# Patient Record
Sex: Female | Born: 1975 | Race: Black or African American | Hispanic: No | Marital: Single | Smoking: Never smoker
Health system: Southern US, Community
[De-identification: ages and names within clinical notes are randomized; demographics above are authoritative.]

## PROBLEM LIST (undated history)

## (undated) DIAGNOSIS — J45909 Unspecified asthma, uncomplicated: Secondary | ICD-10-CM

## (undated) DIAGNOSIS — E079 Disorder of thyroid, unspecified: Secondary | ICD-10-CM

## (undated) DIAGNOSIS — E669 Obesity, unspecified: Secondary | ICD-10-CM

## (undated) DIAGNOSIS — F329 Major depressive disorder, single episode, unspecified: Secondary | ICD-10-CM

## (undated) DIAGNOSIS — I10 Essential (primary) hypertension: Secondary | ICD-10-CM

## (undated) DIAGNOSIS — F32A Depression, unspecified: Secondary | ICD-10-CM

## (undated) DIAGNOSIS — E039 Hypothyroidism, unspecified: Secondary | ICD-10-CM

## (undated) DIAGNOSIS — F419 Anxiety disorder, unspecified: Secondary | ICD-10-CM

## (undated) DIAGNOSIS — J302 Other seasonal allergic rhinitis: Secondary | ICD-10-CM

## (undated) HISTORY — DX: Obesity, unspecified: E66.9

## (undated) HISTORY — DX: Unspecified asthma, uncomplicated: J45.909

## (undated) HISTORY — PX: APPENDECTOMY: SHX54

## (undated) HISTORY — DX: Depression, unspecified: F32.A

## (undated) HISTORY — PX: DILATION AND CURETTAGE OF UTERUS: SHX78

## (undated) HISTORY — DX: Major depressive disorder, single episode, unspecified: F32.9

## (undated) HISTORY — DX: Anxiety disorder, unspecified: F41.9

## (undated) HISTORY — PX: ABDOMINAL HYSTERECTOMY: SHX81

## (undated) HISTORY — PX: THYROIDECTOMY: SHX17

## (undated) HISTORY — PX: WISDOM TOOTH EXTRACTION: SHX21

## (undated) HISTORY — PX: SHOULDER SURGERY: SHX246

---

## 2007-11-10 ENCOUNTER — Emergency Department (HOSPITAL_COMMUNITY): Admission: EM | Admit: 2007-11-10 | Discharge: 2007-11-10 | Payer: Self-pay | Admitting: Emergency Medicine

## 2008-05-14 ENCOUNTER — Emergency Department (HOSPITAL_COMMUNITY): Admission: EM | Admit: 2008-05-14 | Discharge: 2008-05-14 | Payer: Self-pay | Admitting: Emergency Medicine

## 2008-07-02 ENCOUNTER — Emergency Department (HOSPITAL_COMMUNITY): Admission: EM | Admit: 2008-07-02 | Discharge: 2008-07-02 | Payer: Self-pay | Admitting: *Deleted

## 2008-08-02 ENCOUNTER — Emergency Department (HOSPITAL_COMMUNITY): Admission: EM | Admit: 2008-08-02 | Discharge: 2008-08-02 | Payer: Self-pay | Admitting: Emergency Medicine

## 2008-08-08 ENCOUNTER — Emergency Department (HOSPITAL_COMMUNITY): Admission: EM | Admit: 2008-08-08 | Discharge: 2008-08-09 | Payer: Self-pay | Admitting: Emergency Medicine

## 2008-08-11 ENCOUNTER — Encounter: Payer: Self-pay | Admitting: Emergency Medicine

## 2008-08-12 ENCOUNTER — Inpatient Hospital Stay (HOSPITAL_COMMUNITY): Admission: AD | Admit: 2008-08-12 | Discharge: 2008-08-12 | Payer: Self-pay | Admitting: Obstetrics and Gynecology

## 2008-08-24 ENCOUNTER — Ambulatory Visit: Payer: Self-pay | Admitting: Obstetrics & Gynecology

## 2008-08-24 ENCOUNTER — Ambulatory Visit (HOSPITAL_COMMUNITY): Admission: AD | Admit: 2008-08-24 | Discharge: 2008-08-24 | Payer: Self-pay | Admitting: Obstetrics and Gynecology

## 2008-08-24 ENCOUNTER — Encounter: Payer: Self-pay | Admitting: Obstetrics & Gynecology

## 2009-05-23 ENCOUNTER — Emergency Department (HOSPITAL_COMMUNITY): Admission: EM | Admit: 2009-05-23 | Discharge: 2009-05-23 | Payer: Self-pay | Admitting: Emergency Medicine

## 2009-10-29 IMAGING — US US OB TRANSVAGINAL
1 series · 14 of 28 positions shown · non-contrast
Comparison: none

CLINICAL DATA: Early pregnancy, vaginal bleeding, pain

OBSTETRIC <14 WK US AND TRANSVAGINAL OB US
TECHNIQUE: Both transabdominal and transvaginal ultrasound
examinations were performed for complete evaluation of the
gestation as well as the maternal uterus, adnexal regions, and
pelvic cul-de-sac.

[Series 1: unknown · 0.32mm/px · 14 of 39 slices shown]
[im 2/39]
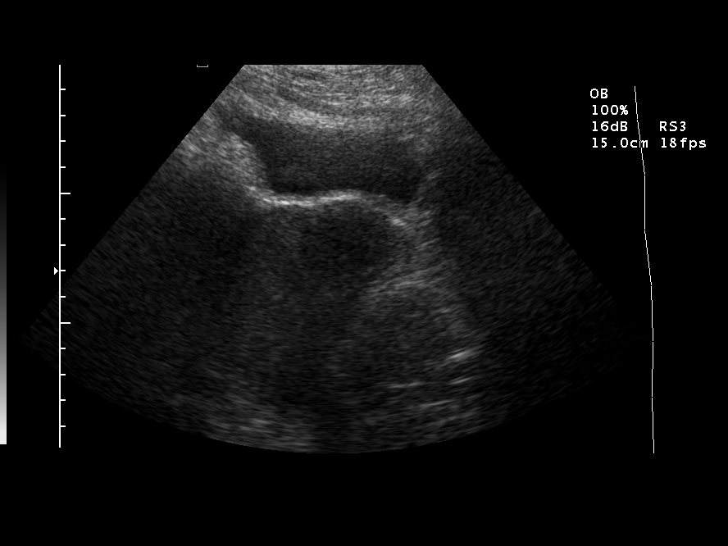
[im 5/39]
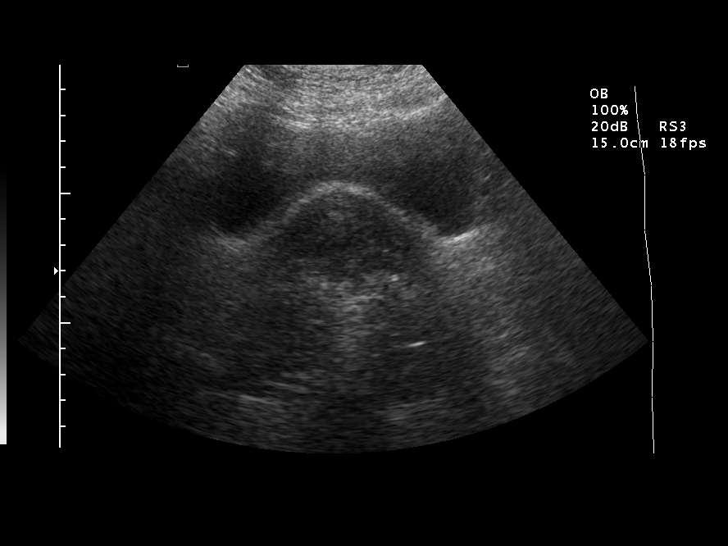
[im 8/39]
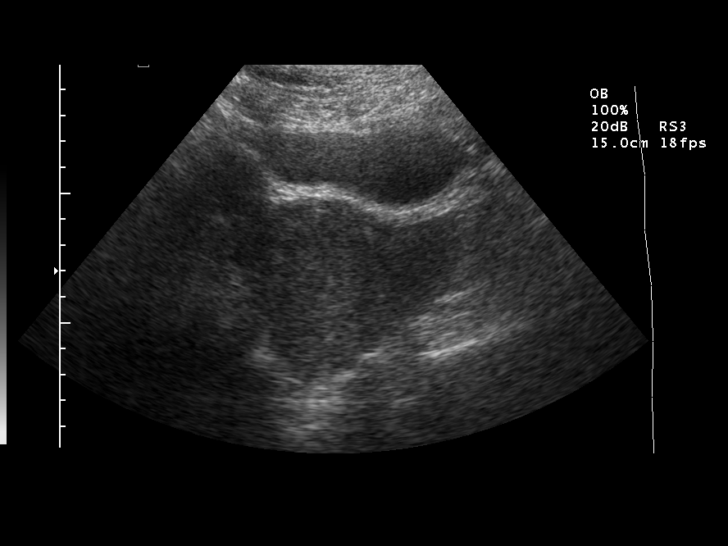
[im 10/39]
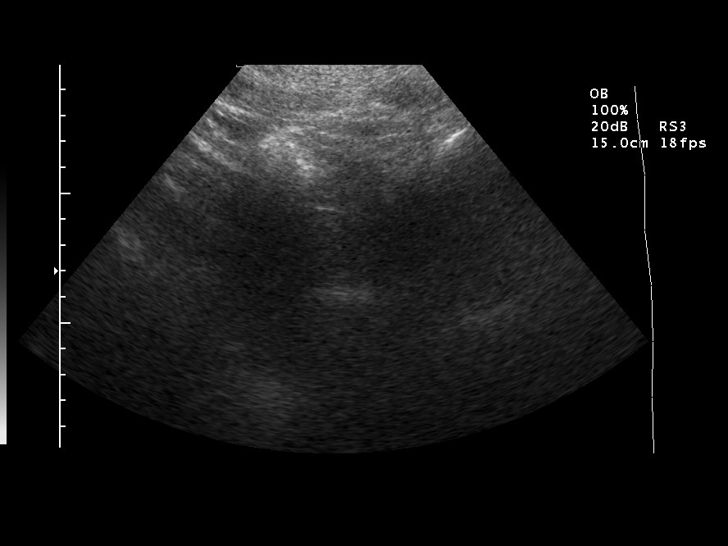
[im 13/39]
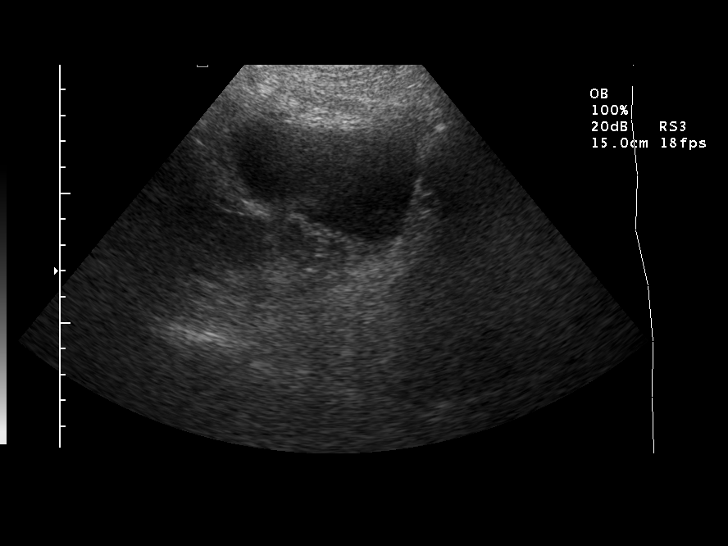
[im 16/39]
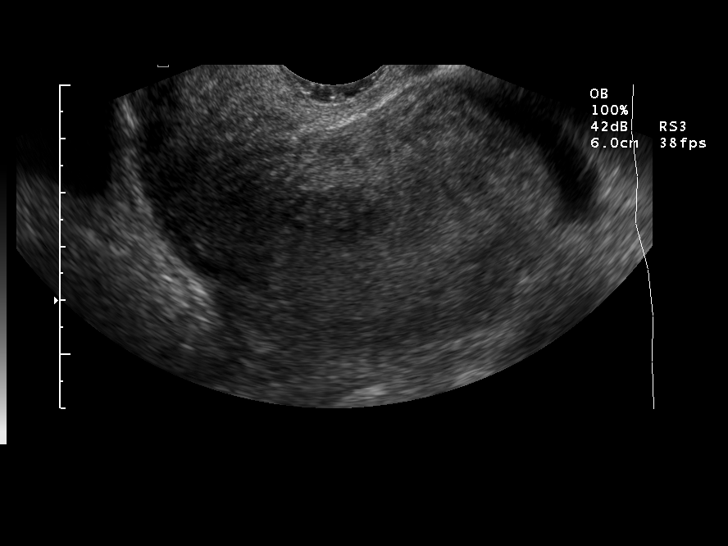
[im 19/39]
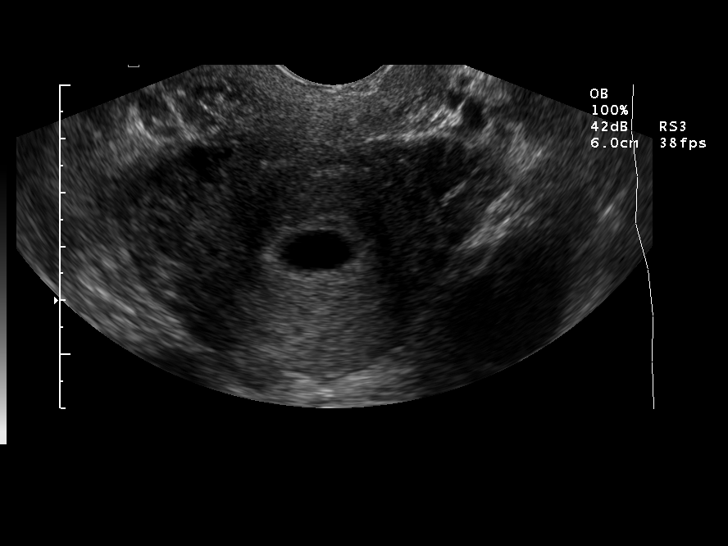
[im 22/39]
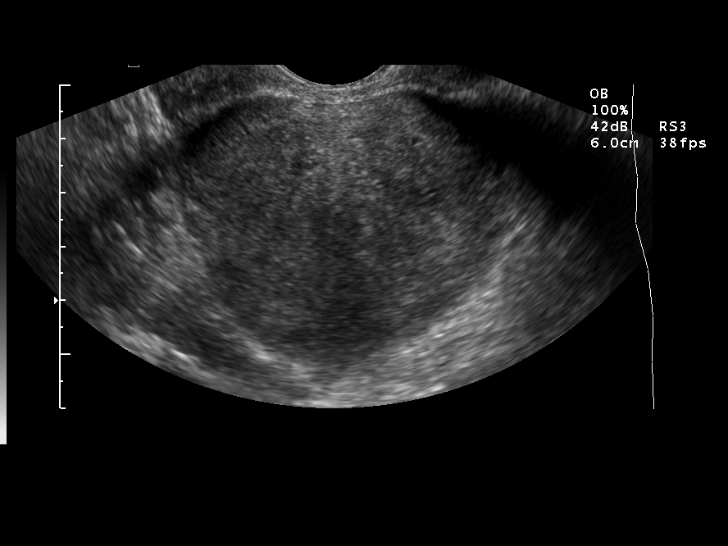
[im 24/39]
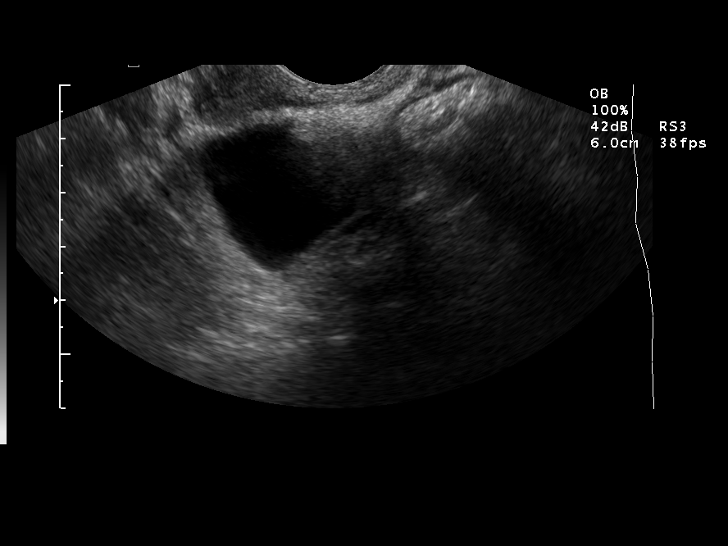
[im 27/39]
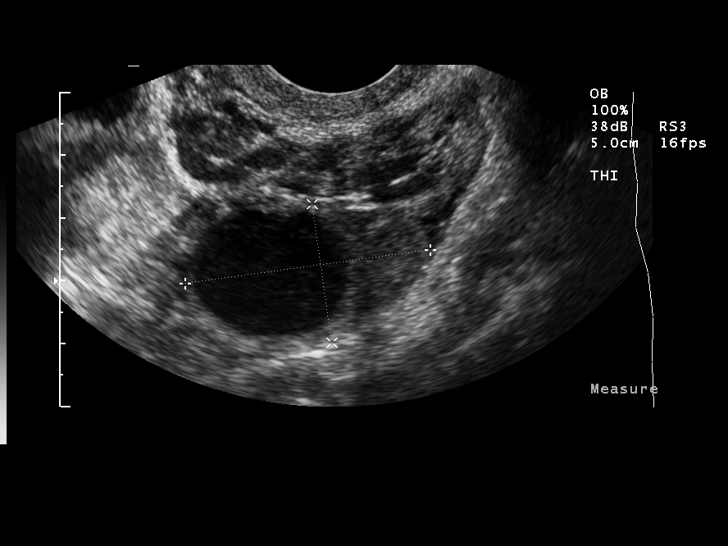
[im 30/39]
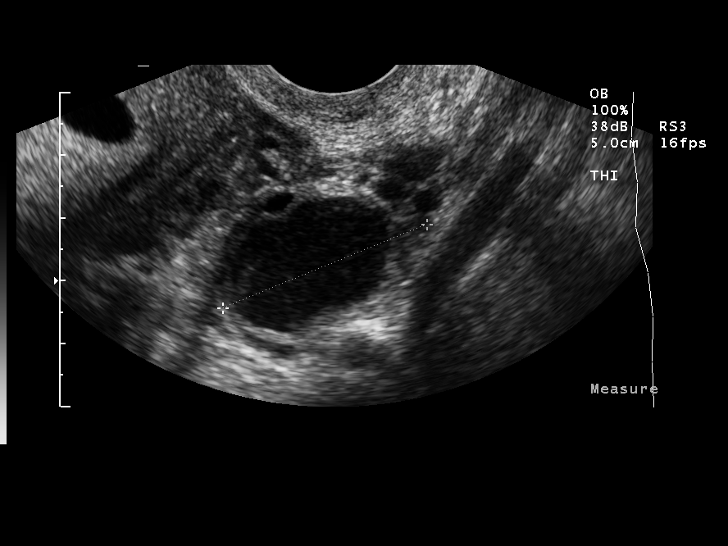
[im 33/39]
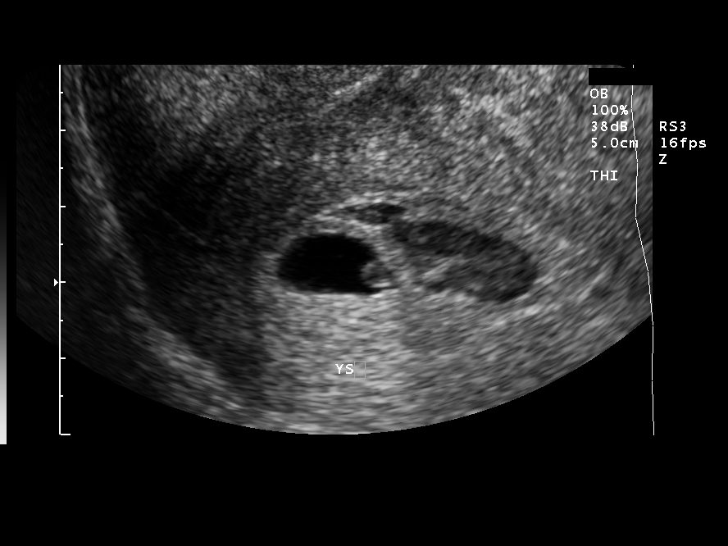
[im 36/39]
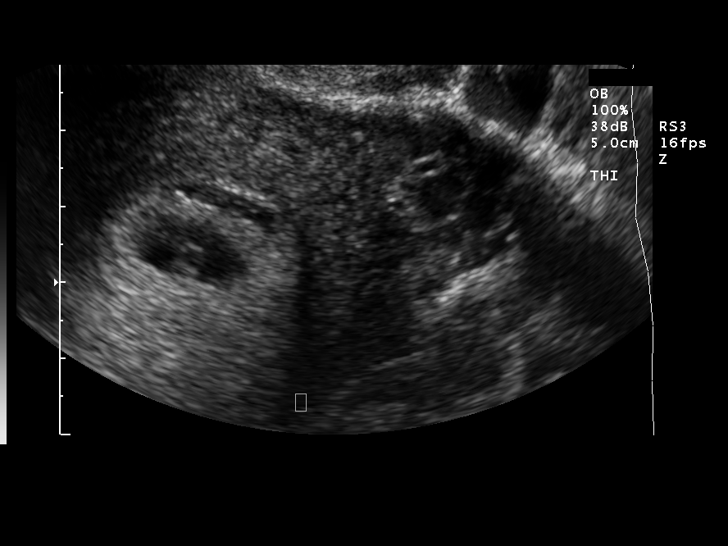
[im 39/39]
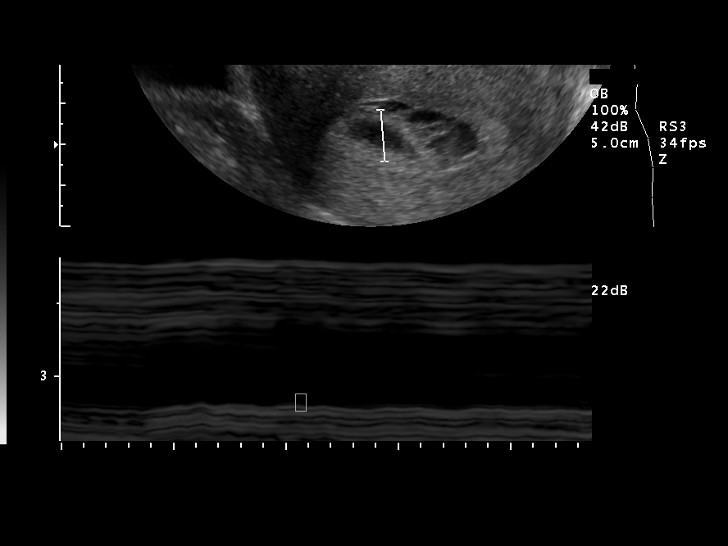

[14 of 28 positions shown; findings below may reference images not displayed]

Intrauterine gestational sac: Present
Yolk sac: Present
Embryo: Not identified
Cardiac Activity: Not identified
Heart Rate: N/A bpm

MSD: 13 mm    6 w 1 d
CRL:               w     d          US EDC:

Maternal uterus/adnexae:
Large subchorionic hemorrhage, 2.9 x 1.0 x 2.3 cm.
No fetal pole visualized.
No free pelvic fluid.
Right ovary normal size and morphology, 2.3 x 1.5 x 1.5 cm.
Left ovary measures 3.9 x 2.2 x 3.5 cm contains a complicated cyst
2.5 x 2.2 x 2.7 cm.
No adnexal masses.
IMPRESSION: Early intrauterine gestation with visualization of a gestational
sac and yolk sac, though no fetal pole is identified.
Large subchorionic hemorrhage.
Follow-up pelvic ultrasound recommended in 7-10 days to assess for
viable intrauterine gestation.
Complicated left ovarian cyst 2.7 cm diameter.

## 2010-08-09 ENCOUNTER — Emergency Department (HOSPITAL_COMMUNITY): Admission: EM | Admit: 2010-08-09 | Discharge: 2010-08-10 | Payer: Self-pay | Admitting: Emergency Medicine

## 2011-01-21 LAB — BASIC METABOLIC PANEL
CO2: 25 mEq/L (ref 19–32)
Chloride: 100 mEq/L (ref 96–112)
Creatinine, Ser: 0.9 mg/dL (ref 0.4–1.2)
GFR calc Af Amer: >60 mL/min (ref >60)
Potassium: 3.1 mEq/L — ABNORMAL LOW (ref 3.5–5.1)

## 2011-01-21 LAB — CBC
HCT: 31.9 % — ABNORMAL LOW (ref 36.0–46.0)
MCH: 30.7 pg (ref 26.0–34.0)
MCV: 88.4 fL (ref 78.0–100.0)
Platelets: 194 10*3/uL (ref 150–400)
RBC: 3.61 MIL/uL — ABNORMAL LOW (ref 3.87–5.11)
WBC: 8.9 10*3/uL (ref 4.0–10.5)

## 2011-01-21 LAB — DIFFERENTIAL
Eosinophils Absolute: 0 10*3/uL (ref 0.0–0.7)
Eosinophils Relative: 0 % (ref 0–5)
Lymphocytes Relative: 31 % (ref 12–46)
Lymphs Abs: 2.8 10*3/uL (ref 0.7–4.0)
Monocytes Absolute: 0.7 10*3/uL (ref 0.1–1.0)
Monocytes Relative: 8 % (ref 3–12)

## 2011-01-21 LAB — GC/CHLAMYDIA PROBE AMP, GENITAL
Chlamydia, DNA Probe: NEGATIVE
GC Probe Amp, Genital: NEGATIVE

## 2011-01-21 LAB — WET PREP, GENITAL: Trich, Wet Prep: NONE SEEN

## 2011-01-21 LAB — HCG, QUANTITATIVE, PREGNANCY: hCG, Beta Chain, Quant, S: 96128 m[IU]/mL — ABNORMAL HIGH (ref <5)

## 2011-02-14 LAB — WET PREP, GENITAL
Trich, Wet Prep: NONE SEEN
Yeast Wet Prep HPF POC: NONE SEEN

## 2011-02-14 LAB — CBC
Hemoglobin: 13.1 g/dL (ref 12.0–15.0)
MCHC: 34.5 g/dL (ref 30.0–36.0)
Platelets: 192 10*3/uL (ref 150–400)
RDW: 15.4 % (ref 11.5–15.5)

## 2011-02-14 LAB — URINALYSIS, ROUTINE W REFLEX MICROSCOPIC
Bilirubin Urine: NEGATIVE
Ketones, ur: NEGATIVE mg/dL
Nitrite: NEGATIVE
Protein, ur: NEGATIVE mg/dL
pH: 7.5 (ref 5.0–8.0)

## 2011-02-14 LAB — COMPREHENSIVE METABOLIC PANEL
ALT: 19 U/L (ref 0–35)
Albumin: 4 g/dL (ref 3.5–5.2)
BUN: 9 mg/dL (ref 6–23)
Calcium: 9.1 mg/dL (ref 8.4–10.5)
Glucose, Bld: 102 mg/dL — ABNORMAL HIGH (ref 70–99)
Potassium: 3.4 mEq/L — ABNORMAL LOW (ref 3.5–5.1)
Sodium: 139 mEq/L (ref 135–145)
Total Protein: 7.4 g/dL (ref 6.0–8.3)

## 2011-02-14 LAB — POCT PREGNANCY, URINE: Preg Test, Ur: NEGATIVE

## 2011-02-14 LAB — GC/CHLAMYDIA PROBE AMP, GENITAL
Chlamydia, DNA Probe: NEGATIVE
GC Probe Amp, Genital: NEGATIVE

## 2011-02-14 LAB — DIFFERENTIAL
Lymphs Abs: 3 10*3/uL (ref 0.7–4.0)
Monocytes Absolute: 0.4 10*3/uL (ref 0.1–1.0)
Monocytes Relative: 6 % (ref 3–12)
Neutro Abs: 3.4 10*3/uL (ref 1.7–7.7)
Neutrophils Relative %: 48 % (ref 43–77)

## 2011-03-23 NOTE — Op Note (Signed)
NAME:  Janice Roberts, Janice Roberts NO.:  [id number]   MEDICAL RECORD NO.:  [id number]          PATIENT TYPE:  [facility]   LOCATION:  [facility]                          FACILITY:  [facility]   PHYSICIAN:  Lesly Dukes, M.D. DATE OF BIRTH:  03/15/1976   DATE OF PROCEDURE:  DATE OF DISCHARGE:                               OPERATIVE REPORT   PREOPERATIVE DIAGNOSES:  86. A 35 year old G2, para 1-0-1-1 with retained products of conception      after spontaneous abortion.  2. Anemia.   POSTOPERATIVE DIAGNOSES:  85. A 35 year old G2, para 1-0-1-1 with retained products of conception      after spontaneous abortion.  2. Anemia.   PROCEDURE:  Dilation and evacuation.   SURGEON:  Lesly Dukes, MD.   ANESTHESIA:  MAC and local.   FINDINGS:  Retroverted uterus, slightly enlarged with active bleeding.   SPECIMENS:  Endometrial curettings to Pathology.   ESTIMATED BLOOD LOSS:  50.   COMPLICATIONS:  None.   PROCEDURE:  After informed consent was obtained, the patient was taken  to the operating room where MAC anesthesia was found to be adequate.  The patient was placed in dorsal lithotomy position and  prepped and  draped in the normal sterile fashion.  The bladder was emptied.  A  bivalve speculum was placed into the patient's vagina and the anterior  lip of the cervix was grasped with a single-tooth tenaculum.  The cervix  was gently dilated with sequential dilators.  A #8 suction curette was  gently introduced into the uterus and suction curettage was performed.  A sharp curette was then introduced into the uterus and a good curl was  fallen off four sides of the uterus.  A final pass with a suction  curette was performed to ensure that all products of conception were  removed from the uterus.  At the end of the procedure, there was good  hemostasis.  All counts were correct x2.  The patient tolerated the  procedure well.  The patient went to recovery room in stable condition.      Lesly Dukes, M.D.  Electronically Signed    KHL/MEDQ  D:  08/24/2008  T:  08/24/2008  Job:  [id number]

## 2011-07-29 LAB — DIFFERENTIAL
Basophils Absolute: 0.1
Basophils Relative: 2 — ABNORMAL HIGH
Eosinophils Absolute: 0
Monocytes Absolute: 0.4
Neutro Abs: 3.3
Neutrophils Relative %: 51

## 2011-07-29 LAB — POCT PREGNANCY, URINE: Operator id: 285841

## 2011-07-29 LAB — URINALYSIS, ROUTINE W REFLEX MICROSCOPIC
Bilirubin Urine: NEGATIVE
Glucose, UA: NEGATIVE
Ketones, ur: NEGATIVE
Protein, ur: NEGATIVE

## 2011-07-29 LAB — BASIC METABOLIC PANEL
CO2: 25
Calcium: 9.2
Creatinine, Ser: 0.77
Glucose, Bld: 106 — ABNORMAL HIGH

## 2011-07-29 LAB — CBC
MCHC: 33.8
RDW: 14

## 2011-08-09 LAB — CBC
HCT: 35.5 — ABNORMAL LOW
HCT: 36.3
Hemoglobin: 12
Hemoglobin: 12.1
MCHC: 33.3
RBC: 3.87
RBC: 3.89
RDW: 15.3
RDW: 15.8 — ABNORMAL HIGH
WBC: 6.5

## 2011-08-09 LAB — URINALYSIS, ROUTINE W REFLEX MICROSCOPIC
Bilirubin Urine: NEGATIVE
Glucose, UA: NEGATIVE
Glucose, UA: NEGATIVE
Hgb urine dipstick: NEGATIVE
Hgb urine dipstick: NEGATIVE
Protein, ur: NEGATIVE
Specific Gravity, Urine: 1.028
pH: 7

## 2011-08-09 LAB — DIFFERENTIAL
Basophils Absolute: 0
Basophils Relative: 1
Eosinophils Absolute: 0.1
Eosinophils Relative: 1
Lymphocytes Relative: 38
Monocytes Absolute: 0.5
Monocytes Relative: 6
Monocytes Relative: 6
Neutro Abs: 2.9
Neutro Abs: 4.8
Neutrophils Relative %: 44

## 2011-08-09 LAB — COMPREHENSIVE METABOLIC PANEL
ALT: 17
Alkaline Phosphatase: 59
BUN: 2 — ABNORMAL LOW
CO2: 27
Chloride: 101
GFR calc non Af Amer: >60
Glucose, Bld: 97
Potassium: 2.8 — ABNORMAL LOW
Sodium: 137
Total Bilirubin: 1.3 — ABNORMAL HIGH

## 2011-08-09 LAB — POCT I-STAT, CHEM 8
BUN: 7
Chloride: 103
Creatinine, Ser: 1.1
Glucose, Bld: 103 — ABNORMAL HIGH
Potassium: 3.5
Sodium: 136

## 2011-08-09 LAB — ABO/RH: ABO/RH(D): B POS

## 2011-08-09 LAB — GC/CHLAMYDIA PROBE AMP, GENITAL
Chlamydia, DNA Probe: NEGATIVE
GC Probe Amp, Genital: NEGATIVE

## 2011-08-09 LAB — WET PREP, GENITAL
Clue Cells Wet Prep HPF POC: NONE SEEN
WBC, Wet Prep HPF POC: NONE SEEN

## 2011-08-09 LAB — HCG, QUANTITATIVE, PREGNANCY
hCG, Beta Chain, Quant, S: 23101 — ABNORMAL HIGH
hCG, Beta Chain, Quant, S: 109355 — ABNORMAL HIGH

## 2011-08-09 LAB — HEMOGLOBIN AND HEMATOCRIT, BLOOD: HCT: 34.3 — ABNORMAL LOW

## 2011-08-10 LAB — CBC
Hemoglobin: 9.4 — ABNORMAL LOW
MCHC: 33.8
RBC: 3 — ABNORMAL LOW
WBC: 5.3

## 2012-08-13 ENCOUNTER — Encounter (HOSPITAL_COMMUNITY): Payer: Self-pay | Admitting: Emergency Medicine

## 2012-08-13 ENCOUNTER — Other Ambulatory Visit: Payer: Self-pay

## 2012-08-13 ENCOUNTER — Emergency Department (HOSPITAL_COMMUNITY): Payer: Self-pay

## 2012-08-13 ENCOUNTER — Emergency Department (HOSPITAL_COMMUNITY)
Admission: EM | Admit: 2012-08-13 | Discharge: 2012-08-13 | Disposition: A | Discharge status: Home / Self Care | Payer: Self-pay | Attending: Emergency Medicine | Admitting: Emergency Medicine

## 2012-08-13 DIAGNOSIS — R0789 Other chest pain: Secondary | ICD-10-CM | POA: Insufficient documentation

## 2012-08-13 DIAGNOSIS — I10 Essential (primary) hypertension: Secondary | ICD-10-CM | POA: Insufficient documentation

## 2012-08-13 DIAGNOSIS — R0602 Shortness of breath: Secondary | ICD-10-CM | POA: Insufficient documentation

## 2012-08-13 HISTORY — DX: Essential (primary) hypertension: I10

## 2012-08-13 HISTORY — DX: Disorder of thyroid, unspecified: E07.9

## 2012-08-13 LAB — CBC
HCT: 37.1 % (ref 36.0–46.0)
MCHC: 34.2 g/dL (ref 30.0–36.0)
MCV: 86.3 fL (ref 78.0–100.0)
RDW: 14.7 % (ref 11.5–15.5)

## 2012-08-13 LAB — BASIC METABOLIC PANEL
BUN: 8 mg/dL (ref 6–23)
Chloride: 104 mEq/L (ref 96–112)
Creatinine, Ser: 0.96 mg/dL (ref 0.50–1.10)
GFR calc Af Amer: 87 mL/min — ABNORMAL LOW (ref >90)
GFR calc non Af Amer: 75 mL/min — ABNORMAL LOW (ref >90)

## 2012-08-13 NOTE — ED Provider Notes (Signed)
History     CSN: [id number]  Arrival date & time 08/13/12  1416   First MD Initiated Contact with Patient 08/13/12 1840      Chief Complaint  Patient presents with  . Chest Pain  . Shortness of Breath    (Consider location/radiation/quality/duration/timing/severity/associated sxs/prior treatment) HPI Comments: Patient presents here with tightness in the chest for the past two weeks.  There is no shortness of breath, diaphoresis, nausea, or radiation to the arm or jaw.  She denies an exertional component.  There are no fevers or chills.  There is no cough.    Patient is a 36 y.o. female presenting with chest pain. The history is provided by the patient.  Chest Pain The chest pain began 1 - 2 weeks ago. Chest pain occurs intermittently. The chest pain is unchanged. The pain is associated with breathing (position). The severity of the pain is moderate. The quality of the pain is described as tightness. The pain does not radiate. Chest pain is worsened by certain positions and deep breathing (palpation). Pertinent negatives for primary symptoms include no fever, no shortness of breath, no cough, no wheezing and no nausea. She tried nothing for the symptoms. Risk factors: hypertension.  Pertinent negatives for family medical history include: no CAD in family.     Past Medical History  Diagnosis Date  . Hypertension   . Thyroid disease     History reviewed. No pertinent past surgical history.  History reviewed. No pertinent family history.  History  Substance Use Topics  . Smoking status: Never Smoker   . Smokeless tobacco: Not on file  . Alcohol Use: No    OB History    Grav Para Term Preterm Abortions TAB SAB Ect Mult Living                  Review of Systems  Constitutional: Negative for fever.  Respiratory: Negative for cough, shortness of breath and wheezing.   Cardiovascular: Positive for chest pain.  Gastrointestinal: Negative for nausea.  All other systems  reviewed and are negative.    Allergies  Ivp dye  Home Medications   Current Outpatient Rx  Name Route Sig Dispense Refill  . BENAZEPRIL HCL 40 MG PO TABS Oral Take 40 mg by mouth daily.    Marland Kitchen LEVOTHYROXINE SODIUM 150 MCG PO TABS Oral Take 150 mcg by mouth daily.      BP 139/102  Pulse 72  Temp 98.7 F (37.1 C) (Oral)  Resp 18  SpO2 100%  LMP 07/23/2012  Physical Exam  Nursing note and vitals reviewed. Constitutional: She is oriented to person, place, and time. She appears well-developed and well-nourished. No distress.  HENT:  Head: Normocephalic and atraumatic.  Neck: Normal range of motion. Neck supple.  Cardiovascular: Normal rate and regular rhythm.  Exam reveals no gallop and no friction rub.   No murmur heard. Pulmonary/Chest: Effort normal and breath sounds normal. No respiratory distress. She has no wheezes.  Abdominal: Soft. Bowel sounds are normal. She exhibits no distension. There is no tenderness.  Musculoskeletal: Normal range of motion.       There is ttp with palpation of the anterior chest wall.   Neurological: She is alert and oriented to person, place, and time.  Skin: Skin is warm and dry. She is not diaphoretic.    ED Course  Procedures (including critical care time)  Labs Reviewed  BASIC METABOLIC PANEL - Abnormal; Notable for the following:    Potassium 3.1 (*)  GFR calc non Af Amer 75 (*)     GFR calc Af Amer 87 (*)     All other components within normal limits  CBC  POCT I-STAT TROPONIN I  D-DIMER, QUANTITATIVE   Dg Chest 2 View  08/13/2012  *RADIOLOGY REPORT*  Clinical Data: Chest pain and shortness of breath.  Cough for 2 weeks.  History of hypertension and thyroid problems.  CHEST - 2 VIEW  Comparison: 05/14/2008  Findings: Cardiac size is normal.  The lungs are free of focal consolidations and pleural effusions.  No pulmonary edema. Visualized osseous structures have a normal appearance.  IMPRESSION: Negative exam.   Original Report  Authenticated By: Patterson Hammersmith, M.D.      No diagnosis found.   Date: 08/13/2012  Rate: 71  Rhythm: normal sinus rhythm  QRS Axis: normal  Intervals: normal  ST/T Wave abnormalities: nonspecific T wave changes  Conduction Disutrbances:none  Narrative Interpretation:   Old EKG Reviewed: none available     MDM  The patient presents here with symptoms atypical for cardiac pain.  The workup reveals a negative troponin and an ekg with non-specific ST segments.  There are no priors for comparison.  She is feeling better and I believe is stable for discharge with nsaids, rest, time.  She will follow up if not improving and return if she worsens.        Geoffery Lyons, MD 08/13/12 [phone]

## 2012-08-13 NOTE — ED Notes (Signed)
Patient reports she has had sharp midsternal chest pain for 2 weeks.  She has had sob as well.  She has hx of asthma.  Lungs are clear to auscultation today.  Patient with no s/sx of acute distress at this time

## 2012-08-13 NOTE — ED Notes (Signed)
Pt c/o midsternal chest tightness and SOB x 2 weeks

## 2013-02-01 ENCOUNTER — Other Ambulatory Visit: Payer: Self-pay | Admitting: Physician Assistant

## 2013-02-01 DIAGNOSIS — I10 Essential (primary) hypertension: Secondary | ICD-10-CM

## 2013-02-01 DIAGNOSIS — E039 Hypothyroidism, unspecified: Secondary | ICD-10-CM

## 2013-02-01 NOTE — Telephone Encounter (Signed)
Medication refilled per protocol.Patient needs to be seen before any further refills 

## 2013-02-27 ENCOUNTER — Emergency Department (HOSPITAL_COMMUNITY)
Admission: EM | Admit: 2013-02-27 | Discharge: 2013-02-27 | Disposition: A | Discharge status: Home / Self Care | Payer: Self-pay | Attending: Emergency Medicine | Admitting: Emergency Medicine

## 2013-02-27 ENCOUNTER — Encounter (HOSPITAL_COMMUNITY): Payer: Self-pay

## 2013-02-27 ENCOUNTER — Emergency Department (HOSPITAL_COMMUNITY): Payer: Self-pay

## 2013-02-27 DIAGNOSIS — R109 Unspecified abdominal pain: Secondary | ICD-10-CM | POA: Insufficient documentation

## 2013-02-27 DIAGNOSIS — O219 Vomiting of pregnancy, unspecified: Secondary | ICD-10-CM

## 2013-02-27 DIAGNOSIS — O9989 Other specified diseases and conditions complicating pregnancy, childbirth and the puerperium: Secondary | ICD-10-CM | POA: Insufficient documentation

## 2013-02-27 DIAGNOSIS — Z3490 Encounter for supervision of normal pregnancy, unspecified, unspecified trimester: Secondary | ICD-10-CM

## 2013-02-27 DIAGNOSIS — Z79899 Other long term (current) drug therapy: Secondary | ICD-10-CM | POA: Insufficient documentation

## 2013-02-27 DIAGNOSIS — O200 Threatened abortion: Secondary | ICD-10-CM

## 2013-02-27 DIAGNOSIS — O99280 Endocrine, nutritional and metabolic diseases complicating pregnancy, unspecified trimester: Secondary | ICD-10-CM | POA: Insufficient documentation

## 2013-02-27 DIAGNOSIS — E079 Disorder of thyroid, unspecified: Secondary | ICD-10-CM | POA: Insufficient documentation

## 2013-02-27 DIAGNOSIS — O169 Unspecified maternal hypertension, unspecified trimester: Secondary | ICD-10-CM | POA: Insufficient documentation

## 2013-02-27 LAB — CBC WITH DIFFERENTIAL/PLATELET
Basophils Absolute: 0 10*3/uL (ref 0.0–0.1)
Basophils Relative: 0 % (ref 0–1)
HCT: 35.9 % — ABNORMAL LOW (ref 36.0–46.0)
Lymphocytes Relative: 30 % (ref 12–46)
Neutro Abs: 5.3 10*3/uL (ref 1.7–7.7)
Neutrophils Relative %: 62 % (ref 43–77)
Platelets: 231 10*3/uL (ref 150–400)
RDW: 14.7 % (ref 11.5–15.5)
WBC: 8.6 10*3/uL (ref 4.0–10.5)

## 2013-02-27 LAB — WET PREP, GENITAL
Trich, Wet Prep: NONE SEEN
Yeast Wet Prep HPF POC: NONE SEEN

## 2013-02-27 LAB — URINALYSIS, MICROSCOPIC ONLY
Nitrite: NEGATIVE
Protein, ur: NEGATIVE mg/dL
Specific Gravity, Urine: 1.025 (ref 1.005–1.030)
Urobilinogen, UA: 1 mg/dL (ref 0.0–1.0)

## 2013-02-27 LAB — COMPREHENSIVE METABOLIC PANEL
Alkaline Phosphatase: 62 U/L (ref 39–117)
BUN: 9 mg/dL (ref 6–23)
CO2: 25 mEq/L (ref 19–32)
GFR calc Af Amer: >90 mL/min (ref >90)
GFR calc non Af Amer: >90 mL/min (ref >90)
Glucose, Bld: 111 mg/dL — ABNORMAL HIGH (ref 70–99)
Potassium: 2.8 mEq/L — ABNORMAL LOW (ref 3.5–5.1)
Total Protein: 8.1 g/dL (ref 6.0–8.3)

## 2013-02-27 LAB — LIPASE, BLOOD: Lipase: 41 U/L (ref 11–59)

## 2013-02-27 MED ORDER — ONDANSETRON HCL 4 MG/2ML IJ SOLN
4.0000 mg | Freq: Once | INTRAMUSCULAR | Status: AC
  Administered 2013-02-27: 4 mg via INTRAVENOUS
  Filled 2013-02-27: qty 2

## 2013-02-27 MED ORDER — ONDANSETRON HCL 4 MG PO TABS: 4.0000 mg | ORAL_TABLET | Freq: Four times a day (QID) | ORAL | Status: DC

## 2013-02-27 MED ORDER — PRENATAL COMPLETE 14-0.4 MG PO TABS: 1.0000 | ORAL_TABLET | Freq: Every day | ORAL | Status: DC

## 2013-02-27 MED ORDER — SODIUM CHLORIDE 0.9 % IV BOLUS (SEPSIS)
1000.0000 mL | Freq: Once | INTRAVENOUS | Status: AC
  Administered 2013-02-27: 1000 mL via INTRAVENOUS

## 2013-02-27 MED ORDER — DEXTROSE IN LACTATED RINGERS 5 % IV SOLN: INTRAVENOUS | Status: DC

## 2013-02-27 MED ORDER — POTASSIUM CHLORIDE CRYS ER 20 MEQ PO TBCR
60.0000 meq | EXTENDED_RELEASE_TABLET | Freq: Once | ORAL | Status: AC
  Administered 2013-02-27: 60 meq via ORAL
  Filled 2013-02-27: qty 3

## 2013-02-27 NOTE — ED Notes (Signed)
Patient presents with left flank pain, nausea and vomiting x 2 days. Feels hungry but unable to eat/drink because of this. LMP 2 weeks ago. Denies urinary sx (dysuria, hematuria, discharge, odor, urinary frequency/urgency). Denies any fevers or sweats. Endorses chills.

## 2013-02-27 NOTE — ED Notes (Addendum)
During assessment, pt was laying on stomach not interacting with this RN.  Pt had eyes closed and was shaking in bed.  Family at bedside say pt has been like this since 2 weeks ago.  Green emesis bag next to pt in on floor.  Spit noted in bag.

## 2013-02-27 NOTE — ED Provider Notes (Signed)
History     CSN: [id number]  Arrival date & time 02/27/13  4098   First MD Initiated Contact with Patient 02/27/13 [phone]      Chief Complaint  Patient presents with  . Flank Pain  . Nausea  . Emesis    (Consider location/radiation/quality/duration/timing/severity/associated sxs/prior treatment) HPI This patient is a 37 year old gravida 2 para 2 female who presents with complaints of nausea and vomiting for the past 2 days. She's been unable to keep down any food or liquids, she states. She has had 2 days of left lower quadrant abdominal pain.  The pain began as intermittent. However, it has been constant for the past 24 hours. Her pain as cramping and sharp in nature. It is nonradiating. The patient denies any exacerbating or relieving factors. She has not had fever, dysuria, unusual vaginal discharge, diarrhea. She has no history of similar symptoms. She has no history of ectopic pregnancy.  The patient states that her last menstrual period was 2 weeks ago. Patient notes that it was unusually heavy with passage of clots. Her LMP prior to that one was 4 weeks prior. She states that she self administered a home pregnancy test 8 days ago which were negative.  Past Medical History  Diagnosis Date  . Hypertension   . Thyroid disease     Past Surgical History  Procedure Laterality Date  . Thyroidectomy    . Cesarean section    . Appendectomy      No family history on file.  History  Substance Use Topics  . Smoking status: Never Smoker   . Smokeless tobacco: Never Used  . Alcohol Use: No    OB History   Grav Para Term Preterm Abortions TAB SAB Ect Mult Living                  Review of Systems Gen: no weight loss, fevers, chills, night sweats Eyes: no discharge or drainage, no occular pain or visual changes Nose: no epistaxis or rhinorrhea Mouth: no dental pain, no sore throat Neck: no neck pain Lungs: no SOB, cough, wheezing CV: no chest pain, palpitations,  dependent edema or orthopnea Abd: as per hpi, otherwise negative GU: as per hpi, otherwise negative MSK: no myalgias or arthralgias Neuro: no headache, no focal neurologic deficits Skin: no rash Psyche: negative.  Allergies  Ivp dye  Home Medications   Current Outpatient Rx  Name  Route  Sig  Dispense  Refill  . benazepril (LOTENSIN) 40 MG tablet      TAKE ONE TABLET BY MOUTH EVERY DAY   30 tablet   0     Patient needs to be seen before any further refill ...   . hydrochlorothiazide (HYDRODIURIL) 25 MG tablet      TAKE ONE TABLET BY MOUTH EVERY DAY   30 tablet   0     Patient needs to be seen before any further refill ...   . levothyroxine (SYNTHROID, LEVOTHROID) 150 MCG tablet      TAKE ONE TABLET BY MOUTH EVERY DAY   30 tablet   0     Patient needs to be seen before any further refill ...     BP 127/85  Pulse 85  Temp(Src) 98.9 F (37.2 C) (Oral)  Resp 14  SpO2 97%  LMP 02/06/2013  Physical Exam Gen: well developed and well nourished appearing, laying on gurney in left lateral decubitus position with eyes closed as I entered the room but easily arousable and  appropriately oriented.  Head: NCAT Eyes: PERL, EOMI Nose: no epistaixis or rhinorrhea Mouth/throat: mucosa is moist and pink Neck: supple, no stridor Lungs: CTA B, no wheezing, rhonchi or rales Abd: soft, obese, tender over the LLQ, no gaurding or reboud ttp,  nondistended Back: no ttp, no cva ttp Skin: no rashese, wnl Neuro: CN ii-xii grossly intact, no focal deficits Psyche; normal affect,  calm and cooperative.   ED Course  Procedures (including critical care time)  Results for orders placed during the hospital encounter of 02/27/13 (from the past 24 hour(s))  COMPREHENSIVE METABOLIC PANEL     Status: Abnormal   Collection Time    02/27/13  1:12 AM      Result Value Range   Sodium 132 (*) 135 - 145 mEq/L   Potassium 2.8 (*) 3.5 - 5.1 mEq/L   Chloride 95 (*) 96 - 112 mEq/L   CO2 25   19 - 32 mEq/L   Glucose, Bld 111 (*) 70 - 99 mg/dL   BUN 9  6 - 23 mg/dL   Creatinine, Ser 1.61  0.50 - 1.10 mg/dL   Calcium 9.2  8.4 - 09.6 mg/dL   Total Protein 8.1  6.0 - 8.3 g/dL   Albumin 4.0  3.5 - 5.2 g/dL   AST 14  0 - 37 U/L   ALT 9  0 - 35 U/L   Alkaline Phosphatase 62  39 - 117 U/L   Total Bilirubin 1.0  0.3 - 1.2 mg/dL   GFR calc non Af Amer >90  >90 mL/min   GFR calc Af Amer >90  >90 mL/min  CBC WITH DIFFERENTIAL     Status: Abnormal   Collection Time    02/27/13  1:12 AM      Result Value Range   WBC 8.6  4.0 - 10.5 K/uL   RBC 4.45  3.87 - 5.11 MIL/uL   Hemoglobin 13.3  12.0 - 15.0 g/dL   HCT 04.5 (*) 40.9 - 81.1 %   MCV 80.7  78.0 - 100.0 fL   MCH 29.9  26.0 - 34.0 pg   MCHC 37.0 (*) 30.0 - 36.0 g/dL   RDW 91.4  78.2 - 95.6 %   Platelets 231  150 - 400 K/uL   Neutrophils Relative 62  43 - 77 %   Neutro Abs 5.3  1.7 - 7.7 K/uL   Lymphocytes Relative 30  12 - 46 %   Lymphs Abs 2.6  0.7 - 4.0 K/uL   Monocytes Relative 8  3 - 12 %   Monocytes Absolute 0.7  0.1 - 1.0 K/uL   Eosinophils Relative 0  0 - 5 %   Eosinophils Absolute 0.0  0.0 - 0.7 K/uL   Basophils Relative 0  0 - 1 %   Basophils Absolute 0.0  0.0 - 0.1 K/uL  LIPASE, BLOOD     Status: None   Collection Time    02/27/13  1:12 AM      Result Value Range   Lipase 41  11 - 59 U/L  URINALYSIS, MICROSCOPIC ONLY     Status: Abnormal   Collection Time    02/27/13  1:13 AM      Result Value Range   Color, Urine YELLOW  YELLOW   APPearance CLOUDY (*) CLEAR   Specific Gravity, Urine 1.025  1.005 - 1.030   pH 5.5  5.0 - 8.0   Glucose, UA NEGATIVE  NEGATIVE mg/dL   Hgb urine dipstick SMALL (*)  NEGATIVE   Bilirubin Urine NEGATIVE  NEGATIVE   Ketones, ur 40 (*) NEGATIVE mg/dL   Protein, ur NEGATIVE  NEGATIVE mg/dL   Urobilinogen, UA 1.0  0.0 - 1.0 mg/dL   Nitrite NEGATIVE  NEGATIVE   Leukocytes, UA NEGATIVE  NEGATIVE   RBC / HPF 0-2  <3 RBC/hpf   Squamous Epithelial / LPF MANY (*) RARE   Urine-Other  AMORPHOUS URATES/PHOSPHATES    POCT PREGNANCY, URINE     Status: Abnormal   Collection Time    02/27/13  1:21 AM      Result Value Range   Preg Test, Ur POSITIVE (*) NEGATIVE  HCG, QUANTITATIVE, PREGNANCY     Status: Abnormal   Collection Time    02/27/13  3:31 AM      Result Value Range   hCG, Beta Chain, Quant, S [id number] (*) <5 mIU/mL    US OB Transvaginal (Final result)  Result time: 02/27/13 05:45:42    Final result by Rad Results In Interface (02/27/13 05:45:42)    Narrative:   *RADIOLOGY REPORT*  Clinical Data: Left lower quadrant pain, first trimester pregnancy  OBSTETRIC <14 WK [place] AND TRANSVAGINAL OB US  Technique: Both transabdominal and transvaginal ultrasound examinations were performed for complete evaluation of the gestation as well as the maternal uterus, adnexal regions, and pelvic cul-de-sac. Transvaginal technique was performed to assess early pregnancy.  Comparison: None.  Intrauterine gestational sac: Visualized/normal in shape. Yolk sac: Identified Embryo: Identified Cardiac Activity: Identified Heart Rate: 137 bpm  CRL: 10.6 mm 7 w 1 d [place] EDC: 10/15/2013  Maternal uterus/adnexae: Normal sonographic appearance to the ovaries. Left corpus luteal cyst. No subchorionic hemorrhage. No free fluid.  IMPRESSION: Single intrauterine gestation with cardiac activity documented. Estimated age of 7 weeks 1 day by crown-rump length.   Original Report Authenticated By: Jearld Lesch, M.D.    [id number]: recheck and pelvic exam. Patient is feeling better. No further pain. Nausea has resolved after tx with Zofran and the patient is tolerating po.   Pelvic exam: normal female external genitalia and vulva, no vaginal discharge, normal appearing cervix, cervical os closed, no blood in vagina, no CMT.    MDM  1st trimester pregnancy (unknown until today) with LLQ abdominal pain and nausea/vomiting. We are treating with IVF, zofran and pepcid. Pelvic U/S ordered to  exclude ectopic pregnancy. Will perform pelvic exam to exclude PID.  Chart review shows that patient's blood type is Rh positive.  The patient is hemodynamically stable at this time.   0865: ED work up is notable for new diagnosis of first trimester pregnancy with viable IUP on U/S and no signs of left adnexal mass concerning for heterotopic pregnancy. No signs of PID on pelvic exam. Etiology of left flank pain unclear. Patient is stable for d/c. She says she will follow up with her OB/GYN in [place]. Does not recall the name of this doctor. Counseled to return to the ED for red flag sx. Home with scripts for PNV and Zofran.         Brandt Loosen, MD 02/27/13 [phone]

## 2013-02-27 NOTE — ED Notes (Signed)
Pt comfortable with d/c and f/u instructions. Prescriptions x2. 

## 2013-03-07 ENCOUNTER — Emergency Department (HOSPITAL_COMMUNITY): Payer: Self-pay

## 2013-03-07 ENCOUNTER — Encounter (HOSPITAL_COMMUNITY): Payer: Self-pay | Admitting: *Deleted

## 2013-03-07 ENCOUNTER — Emergency Department (HOSPITAL_COMMUNITY)
Admission: EM | Admit: 2013-03-07 | Discharge: 2013-03-08 | Disposition: A | Discharge status: Home / Self Care | Payer: Self-pay | Attending: Emergency Medicine | Admitting: Emergency Medicine

## 2013-03-07 DIAGNOSIS — O43891 Other placental disorders, first trimester: Secondary | ICD-10-CM

## 2013-03-07 DIAGNOSIS — O43899 Other placental disorders, unspecified trimester: Secondary | ICD-10-CM | POA: Insufficient documentation

## 2013-03-07 DIAGNOSIS — B9689 Other specified bacterial agents as the cause of diseases classified elsewhere: Secondary | ICD-10-CM

## 2013-03-07 DIAGNOSIS — O2390 Unspecified genitourinary tract infection in pregnancy, unspecified trimester: Secondary | ICD-10-CM | POA: Insufficient documentation

## 2013-03-07 DIAGNOSIS — O368990 Maternal care for other specified fetal problems, unspecified trimester, not applicable or unspecified: Secondary | ICD-10-CM | POA: Insufficient documentation

## 2013-03-07 DIAGNOSIS — O169 Unspecified maternal hypertension, unspecified trimester: Secondary | ICD-10-CM | POA: Insufficient documentation

## 2013-03-07 DIAGNOSIS — E079 Disorder of thyroid, unspecified: Secondary | ICD-10-CM | POA: Insufficient documentation

## 2013-03-07 DIAGNOSIS — Z79899 Other long term (current) drug therapy: Secondary | ICD-10-CM | POA: Insufficient documentation

## 2013-03-07 LAB — HCG, SERUM, QUALITATIVE: Preg, Serum: POSITIVE — AB

## 2013-03-07 LAB — WET PREP, GENITAL
Trich, Wet Prep: NONE SEEN
WBC, Wet Prep HPF POC: NONE SEEN
Yeast Wet Prep HPF POC: NONE SEEN

## 2013-03-07 LAB — HCG, QUANTITATIVE, PREGNANCY: hCG, Beta Chain, Quant, S: 118458 m[IU]/mL — ABNORMAL HIGH (ref <5)

## 2013-03-07 LAB — CBC
MCH: 29.5 pg (ref 26.0–34.0)
MCHC: 36.2 g/dL — ABNORMAL HIGH (ref 30.0–36.0)
MCV: 81.6 fL (ref 78.0–100.0)
Platelets: 218 10*3/uL (ref 150–400)
RBC: 4.13 MIL/uL (ref 3.87–5.11)

## 2013-03-07 MED ORDER — METRONIDAZOLE 500 MG PO TABS: 500.0000 mg | ORAL_TABLET | Freq: Two times a day (BID) | ORAL | Status: DC

## 2013-03-07 NOTE — ED Notes (Signed)
Pt reports being approx [redacted] weeks pregnant. Had onset of heavy vaginal bleeding on Monday and reports passing a large clot. Still having moderate bleeding. No acute distress noted at this time.

## 2013-03-07 NOTE — ED Notes (Signed)
Pt returned from US

## 2013-03-07 NOTE — ED Notes (Signed)
Pelvic cart at bedside. 

## 2013-03-07 NOTE — ED Provider Notes (Signed)
History     CSN: [id number]  Arrival date & time 03/07/13  1601   First MD Initiated Contact with Patient 03/07/13 2010      Chief Complaint  Patient presents with  . Vaginal Bleeding    (Consider location/radiation/quality/duration/timing/severity/associated sxs/prior treatment) HPI Comments: Janice Roberts is a 37 y.o. G3P2 female that was recently told she was pregnant. Her last menstrual period was 02/06/2013 (abnormal lasting 1 day), presents to the ER for vaginal bleeding. Onset began Monday around 11:45 am when pt felt as though she "wet" herself, then noticed gross blood. Later that afternoon pt passed large blood clots. Associated symptoms since Monday include diffuse abdominal cramping, nausea, vomiting, anorexia & diarrhea. Pt denies fevers, nights sweats, chills, dysuria, urinary frequency, syncope, dizziness. Symptoms are moderate. No known exacerbating or alleviating factors. In addition pt reports an abnormal vaginal odor that she is unable to characterize.     Patient is a 37 y.o. female presenting with vaginal bleeding. The history is provided by the patient.  Vaginal Bleeding Pertinent negatives include no abdominal pain, chest pain, chills, congestion, fever, headaches, numbness or weakness.    Past Medical History  Diagnosis Date  . Hypertension   . Thyroid disease     Past Surgical History  Procedure Laterality Date  . Thyroidectomy    . Cesarean section    . Appendectomy      History reviewed. No pertinent family history.  History  Substance Use Topics  . Smoking status: Never Smoker   . Smokeless tobacco: Never Used  . Alcohol Use: No    OB History   Grav Para Term Preterm Abortions TAB SAB Ect Mult Living                  Review of Systems  Constitutional: Negative for fever, chills and appetite change.  HENT: Negative for congestion.   Eyes: Negative for visual disturbance.  Respiratory: Negative for shortness of breath.    Cardiovascular: Negative for chest pain and leg swelling.  Gastrointestinal: Negative for abdominal pain.  Genitourinary: Positive for vaginal bleeding. Negative for dysuria, urgency, frequency, decreased urine volume, vaginal discharge, difficulty urinating, genital sores, vaginal pain and dyspareunia.  Neurological: Negative for dizziness, syncope, weakness, light-headedness, numbness and headaches.  Psychiatric/Behavioral: Negative for confusion.  All other systems reviewed and are negative.    Allergies  Ivp dye  Home Medications   Current Outpatient Rx  Name  Route  Sig  Dispense  Refill  . benazepril (LOTENSIN) 40 MG tablet      TAKE ONE TABLET BY MOUTH EVERY DAY   30 tablet   0     Patient needs to be seen before any further refill ...   . hydrochlorothiazide (HYDRODIURIL) 25 MG tablet      TAKE ONE TABLET BY MOUTH EVERY DAY   30 tablet   0     Patient needs to be seen before any further refill ...   . levothyroxine (SYNTHROID, LEVOTHROID) 150 MCG tablet      TAKE ONE TABLET BY MOUTH EVERY DAY   30 tablet   0     Patient needs to be seen before any further refill ...     BP 120/73  Pulse 53  Temp(Src) 98.5 F (36.9 C) (Oral)  Resp 16  SpO2 99%  LMP 02/06/2013  Physical Exam  Nursing note and vitals reviewed. Constitutional: She is oriented to person, place, and time. She appears well-developed and well-nourished. No distress.  HENT:  Head: Normocephalic and atraumatic.  Eyes: Conjunctivae and EOM are normal.  Neck: Normal range of motion.  Cardiovascular:  RRR  Pulmonary/Chest: Effort normal.  LCAB  Abdominal:   Bowel sounds present. Small surgical scar (appendectomy). Periumbilical tenderness to palpation. No peritoneal signs.   Genitourinary:  Pelvic exam chaperoned.  No abnormalities of the external genitalia.  Cervical os closed.  No injury at vaginal bleeding seen.  Mild discharge however no discharge from os.  No malodor.  No adnexal  tenderness  Musculoskeletal: Normal range of motion.  Neurological: She is alert and oriented to person, place, and time.  Skin: Skin is warm and dry. No rash noted. She is not diaphoretic.  No bruising.   Psychiatric: She has a normal mood and affect. Her behavior is normal.    ED Course  Procedures (including critical care time)  Labs Reviewed  CBC - Abnormal; Notable for the following:    HCT 33.7 (*)    MCHC 36.2 (*)    All other components within normal limits  HCG, QUANTITATIVE, PREGNANCY - Abnormal; Notable for the following:    hCG, Beta Chain, Quant, [place] [id number] (*)    All other components within normal limits  HCG, SERUM, QUALITATIVE - Abnormal; Notable for the following:    Preg, Serum POSITIVE (*)    All other components within normal limits  POCT PREGNANCY, URINE - Abnormal; Notable for the following:    Preg Test, Ur POSITIVE (*)    All other components within normal limits  GC/CHLAMYDIA PROBE AMP  WET PREP, GENITAL  ABO/RH   US Ob Comp Addl Gest Less 14 Wks  03/07/2013  *RADIOLOGY REPORT*  Clinical Data: 37 year old with LMP 01/08/2013, presenting with vaginal bleeding.  OBSTETRIC <14 WK [place] AND TRANSVAGINAL OB US  Technique:  Both transabdominal and transvaginal ultrasound examinations were performed for complete evaluation of the gestation as well as the maternal uterus, adnexal regions, and pelvic cul-de-sac.  Transvaginal technique was performed to assess early pregnancy.  Comparison:  Early OB ultrasound 02/27/2013.  Intrauterine gestational sac:  Single, normal in shape. Yolk sac: Visualized. Embryo: Visualized. Cardiac Activity: Visualized. Heart Rate: 168 bpm  CRL: 21.6  mm  8 w  6 d             [place] EDC: 10/11/2013.  Maternal uterus/adnexae: Very small subchorionic hemorrhage.  Normal-appearing ovaries bilaterally, the right measuring approximately 2.4 x 2.4 x 1.3 cm and the left measuring approximately 2.8 x 2.4 x 1.8 cm.  No adnexal masses or free pelvic fluid.   IMPRESSION:  1.  Single live intrauterine embryo with estimated gestational age [redacted] weeks 6 days by crown-rump length, ultrasound [facility] 10/11/2013. This is slightly ahead of the estimated gestational age by the first ultrasound which would be 8 weeks 2 days. 2.  Very small subchorionic hemorrhage. 3.  Normal-appearing ovaries.  No adnexal masses or free pelvic fluid.   Original Report Authenticated By: Hulan Saas, M.D.    US Ob Transvaginal  03/07/2013  *RADIOLOGY REPORT*  Clinical Data: 37 year old with LMP 01/08/2013, presenting with vaginal bleeding.  OBSTETRIC <14 WK [place] AND TRANSVAGINAL OB US  Technique:  Both transabdominal and transvaginal ultrasound examinations were performed for complete evaluation of the gestation as well as the maternal uterus, adnexal regions, and pelvic cul-de-sac.  Transvaginal technique was performed to assess early pregnancy.  Comparison:  Early OB ultrasound 02/27/2013.  Intrauterine gestational sac:  Single, normal in shape. Yolk sac: Visualized. Embryo: Visualized. Cardiac Activity:  Visualized. Heart Rate: 168 bpm  CRL: 21.6  mm  8 w  6 d             [place] EDC: 10/11/2013.  Maternal uterus/adnexae: Very small subchorionic hemorrhage.  Normal-appearing ovaries bilaterally, the right measuring approximately 2.4 x 2.4 x 1.3 cm and the left measuring approximately 2.8 x 2.4 x 1.8 cm.  No adnexal masses or free pelvic fluid.  IMPRESSION:  1.  Single live intrauterine embryo with estimated gestational age [redacted] weeks 6 days by crown-rump length, ultrasound [facility] 10/11/2013. This is slightly ahead of the estimated gestational age by the first ultrasound which would be 8 weeks 2 days. 2.  Very small subchorionic hemorrhage. 3.  Normal-appearing ovaries.  No adnexal masses or free pelvic fluid.   Original Report Authenticated By: Hulan Saas, M.D.      No diagnosis found.    MDM  Subchorionic hemorrhage   Pt is [redacted] wks pregnant G3P2 presenting to the ER c/o vaginal bleeding. No  significant abd tenderness on exam. BP 122/85  Pulse 51  Temp(Src) 98.5 F (36.9 C) (Oral)  Resp 16  SpO2 99%  LMP 02/06/2013 Cervical os closed w subchorionic hemorrhage seen on [place]. Low suspicion for threatened abortion.  Patient with many clue cells on wet prep.  Will discharge with Flagyl. GC/Chlamydia cultures pending. Recommended followup with OBGYN, call first thing tomorrow morning.      Jaci Carrel, [place] 03/07/13 [id number]

## 2013-03-07 NOTE — ED Notes (Signed)
PA-C and PA student at bedside

## 2013-03-07 NOTE — ED Provider Notes (Signed)
Medical screening examination/treatment/procedure(s) were performed by non-physician practitioner and as supervising physician I was immediately available for consultation/collaboration.   Glynn Octave, MD 03/07/13 [phone]

## 2013-03-09 LAB — GC/CHLAMYDIA PROBE AMP: GC Probe RNA: NEGATIVE

## 2013-04-17 ENCOUNTER — Other Ambulatory Visit: Payer: Self-pay | Admitting: Physician Assistant

## 2013-04-18 NOTE — Telephone Encounter (Signed)
Pt has CPE appt end of this month.  Rx's rf x 1

## 2013-05-02 ENCOUNTER — Other Ambulatory Visit: Payer: Self-pay | Admitting: Physician Assistant

## 2013-05-02 ENCOUNTER — Encounter: Payer: Self-pay | Admitting: Physician Assistant

## 2013-05-02 ENCOUNTER — Ambulatory Visit (INDEPENDENT_AMBULATORY_CARE_PROVIDER_SITE_OTHER): Payer: Medicaid Other | Admitting: Physician Assistant

## 2013-05-02 VITALS — BP 150/94 | HR 68 | Temp 98.7°F | Resp 18 | Ht 64.5 in | Wt 261.0 lb

## 2013-05-02 DIAGNOSIS — E669 Obesity, unspecified: Secondary | ICD-10-CM

## 2013-05-02 DIAGNOSIS — J45909 Unspecified asthma, uncomplicated: Secondary | ICD-10-CM | POA: Insufficient documentation

## 2013-05-02 DIAGNOSIS — Z309 Encounter for contraceptive management, unspecified: Secondary | ICD-10-CM

## 2013-05-02 DIAGNOSIS — J4520 Mild intermittent asthma, uncomplicated: Secondary | ICD-10-CM

## 2013-05-02 DIAGNOSIS — I10 Essential (primary) hypertension: Secondary | ICD-10-CM

## 2013-05-02 DIAGNOSIS — E079 Disorder of thyroid, unspecified: Secondary | ICD-10-CM

## 2013-05-02 LAB — COMPLETE METABOLIC PANEL WITH GFR
CO2: 26 mEq/L (ref 19–32)
Creat: 0.87 mg/dL (ref 0.50–1.10)
GFR, Est African American: >89 mL/min
GFR, Est Non African American: 85 mL/min
Glucose, Bld: 96 mg/dL (ref 70–99)
Total Bilirubin: 0.6 mg/dL (ref 0.3–1.2)

## 2013-05-02 MED ORDER — HYDROCHLOROTHIAZIDE 25 MG PO TABS: 25.0000 mg | ORAL_TABLET | Freq: Every day | ORAL | Status: DC

## 2013-05-02 MED ORDER — LEVOTHYROXINE SODIUM 150 MCG PO TABS: ORAL_TABLET | ORAL | Status: DC

## 2013-05-02 MED ORDER — BENAZEPRIL HCL 40 MG PO TABS: 40.0000 mg | ORAL_TABLET | Freq: Every day | ORAL | Status: DC

## 2013-05-02 NOTE — Progress Notes (Signed)
Patient ID: Janice Roberts MRN: [id number], DOB: [birth year], 37 y.o. Date of Encounter: 05/02/2013,   Chief Complaint: Physical (CPE)  HPI: 37 y.o. y/o AA female  here for CPE.   Says that she has been out of all of her meds for 3 days. Until these 3 days, she had been taking them all daily without skipping meds.   She wants to get on DepoProvera for contraception.    Review of Systems: Consitutional: No fever, chills, fatigue, night sweats, lymphadenopathy. No significant/unexplained weight changes. Eyes: No visual changes, eye redness, or discharge. ENT/Mouth: No ear pain, sore throat, nasal drainage, or sinus pain. Cardiovascular: No chest pressure,heaviness, tightness or squeezing, even with exertion. No increased shortness of breath or dyspnea on exertion.No palpitations, edema, orthopnea, PND. Respiratory: No cough, hemoptysis, SOB, or wheezing. Gastrointestinal: No anorexia, dysphagia, reflux, pain, nausea, vomiting, hematemesis, diarrhea, constipation, BRBPR, or melena. Breast: No mass, nodules, bulging, or retraction. No skin changes or inflammation. No nipple discharge. No lymphadenopathy. Genitourinary: No dysuria, hematuria, incontinence, vaginal discharge, pruritis, burning, abnormal bleeding, or pain. Musculoskeletal: No decreased ROM, No joint pain or swelling. No significant pain in neck, back, or extremities. Skin: No rash, pruritis, or concerning lesions. Neurological: No headache, dizziness, syncope, seizures, tremors, memory loss, coordination problems, or paresthesias. Psychological: No anxiety, depression, hallucinations, SI/HI. Endocrine: No polydipsia, polyphagia, polyuria, or known diabetes.No increased fatigue. No palpitations/rapid heart rate. No significant/unexplained weight change. All other systems were reviewed and are otherwise negative.  Past Medical History  Diagnosis Date  . Asthma   . Obesity   . Hypertension   . Thyroid disease      Past  Surgical History  Procedure Laterality Date  . Thyroidectomy    . Cesarean section    . Appendectomy    . Dilation and curettage of uterus  08/08/2008 & 03/08/2013    Home Meds:  No current outpatient prescriptions on file prior to visit.   No current facility-administered medications on file prior to visit.    Allergies:  Allergies  Allergen Reactions  . Ivp Dye (Iodinated Diagnostic Agents) Other (See Comments)    Almost died.    History   Social History  . Marital Status: Single    Spouse Name: N/A    Number of Children: N/A  . Years of Education: N/A   Occupational History  . Not on file.   Social History Main Topics  . Smoking status: Never Smoker   . Smokeless tobacco: Never Used  . Alcohol Use: No  . Drug Use: No  . Sexually Active: Not on file   Other Topics Concern  . Not on file   Social History Narrative  . No narrative on file    Family History  Problem Relation Age of Onset  . Cancer Mother 65    Breast  . Asthma Sister   . Asthma Brother   . Asthma Sister   . Birth defects Sister     Physical Exam: Blood pressure 150/94, pulse 68, temperature 98.7 F (37.1 C), temperature source Oral, resp. rate 18, height 5' 4.5" (1.638 m), weight 261 lb (118.389 kg), last menstrual period 04/16/2013., Body mass index is 44.12 kg/(m^2). General: Obese AAF. in no acute distress. HEENT: Normocephalic, atraumatic. Conjunctiva pink, sclera non-icteric. Pupils 2 mm constricting to 1 mm, round, regular, and equally reactive to light and accomodation. EOMI. Internal auditory canal clear. TMs with good cone of light and without pathology. Nasal mucosa pink. Nares are without discharge. No sinus  tenderness. Oral mucosa pink.  Pharynx without exudate.   Neck: Supple. Trachea midline. No thyromegaly. Full ROM. No lymphadenopathy.No Carotid Bruits. Lungs: Clear to auscultation bilaterally without wheezes, rales, or rhonchi. Breathing is of normal effort and  unlabored. Cardiovascular: RRR with S1 S2. No murmurs, rubs, or gallops. Distal pulses 2+ symmetrically. No carotid or abdominal bruits. Breast: Symmetrical. No masses. Nipples without discharge. Abdomen: Soft, non-tender, non-distended with normoactive bowel sounds. No hepatosplenomegaly or masses. No rebound/guarding. No CVA tenderness. No hernias.  Genitourinary:  External genitalia without lesions. Vaginal mucosa pink.No discharge present. Cervix pink and without discharge. No cervical tenderness.Normal uterus size. No adnexal mass or tenderness.   Musculoskeletal: Full range of motion and 5/5 strength throughout. Without swelling, atrophy, tenderness, crepitus, or warmth. Extremities without clubbing, cyanosis, or edema. Calves supple. Skin: Warm and moist without erythema, ecchymosis, wounds, or rash. Neuro: A+Ox3. CN II-XII grossly intact. Moves all extremities spontaneously. Full sensation throughout. Normal gait. DTR 2+ throughout upper and lower extremities. Finger to nose intact. Psych:  Responds to questions appropriately with a normal affect.   Assessment/Plan:  37 y.o. y/o female here for CPE 1. Contraception management - hCG, serum, qualitative. If this is negative, then will Rx DepoProvera. She says that she IS available to get the Rx and injection Tomorrow, (assuming test is negative)  2. Hypertension Elevated now as she has been out of her med for 3 days. Resume meds.  - COMPLETE METABOLIC PANEL WITH GFR - benazepril (LOTENSIN) 40 MG tablet; Take 1 tablet (40 mg total) by mouth daily.  Dispense: 30 tablet; Refill: 2 - hydrochlorothiazide (HYDRODIURIL) 25 MG tablet; Take 1 tablet (25 mg total) by mouth daily.  Dispense: 30 tablet; Refill: 2  3. Thyroid disease She says she has taken med every day except the past 3 days so will go ahead and check lab.  - TSH - levothyroxine (SYNTHROID, LEVOTHROID) 150 MCG tablet; TAKE ONE TABLET BY MOUTH ONCE DAILY  Dispense: 30 tablet;  Refill: 2  4. Asthma, mild intermittent, uncomplicated Stable with no recent flare. Rarely needs albuterol.  5. Obesity Have discussed diet, exercise. - COMPLETE METABOLIC PANEL WITH GFR  6. Preventive Care/CPE: A. Screening Labs: 2010 FLP was favorable. Do not need to repeat now.  Check CMET, TSH  B. Pap: Had here11/ 2011-nml. Repeat 3-5 years.(09/2013-09/2016.)   Signed, Shon Hale [place], [place], [facility] 05/02/2013 7:16 PM

## 2013-05-03 ENCOUNTER — Telehealth: Payer: Self-pay | Admitting: Family Medicine

## 2013-05-03 DIAGNOSIS — E079 Disorder of thyroid, unspecified: Secondary | ICD-10-CM

## 2013-05-03 MED ORDER — MEDROXYPROGESTERONE ACETATE 150 MG/ML IM SUSP: 150.0000 mg | INTRAMUSCULAR | Status: DC

## 2013-05-03 MED ORDER — LEVOTHYROXINE SODIUM 150 MCG PO TABS: ORAL_TABLET | ORAL | Status: DC

## 2013-05-03 NOTE — Telephone Encounter (Signed)
Message copied by Donne Anon on Thu May 03, 2013  4:50 PM ------      Message from: Allayne Butcher      Created: Wed May 02, 2013  8:09 PM       TSH is Extremely High. Pt said she had been out of med for only 3 days and o/w had been taking every day but I dont think this is possible. She is on high dose. There is no recent TSH in Epic to compare.       Tell her to take DAILY and recheck TSH 6 weeks.      Send Rx: Levothyroxine one po QD # 30/ 0ne refill. Order TSH.             Tell her that Pregnancy Test Negative.       Send Rx for DepoProvera 150mg  IM  Q 13 wweks # 150mg  prn refills.  to pharmacy. Tell pt to get med and bring to office for injection TODAY. ------

## 2013-05-03 NOTE — Telephone Encounter (Signed)
Spoke to pt.  Depo provera RX to pharmacy.  Pt to pick up and come to office for injection in AM.  Told take thyroid med every day.  Level was very high.  Need to repeat TSH 6 weeks

## 2013-05-04 ENCOUNTER — Ambulatory Visit (INDEPENDENT_AMBULATORY_CARE_PROVIDER_SITE_OTHER): Payer: Medicaid Other | Admitting: Family Medicine

## 2013-05-04 DIAGNOSIS — Z3049 Encounter for surveillance of other contraceptives: Secondary | ICD-10-CM

## 2013-05-04 DIAGNOSIS — Z3042 Encounter for surveillance of injectable contraceptive: Secondary | ICD-10-CM

## 2013-05-04 MED ORDER — MEDROXYPROGESTERONE ACETATE 150 MG/ML IM SUSP
150.0000 mg | Freq: Once | INTRAMUSCULAR | Status: AC
  Administered 2013-05-04: 150 mg via INTRAMUSCULAR

## 2013-05-21 ENCOUNTER — Ambulatory Visit (INDEPENDENT_AMBULATORY_CARE_PROVIDER_SITE_OTHER): Payer: Medicaid Other | Admitting: Physician Assistant

## 2013-05-21 ENCOUNTER — Encounter: Payer: Self-pay | Admitting: Physician Assistant

## 2013-05-21 VITALS — BP 118/68 | HR 72 | Temp 98.6°F | Resp 18 | Wt 212.0 lb

## 2013-05-21 DIAGNOSIS — F411 Generalized anxiety disorder: Secondary | ICD-10-CM

## 2013-05-21 DIAGNOSIS — F329 Major depressive disorder, single episode, unspecified: Secondary | ICD-10-CM

## 2013-05-21 MED ORDER — SERTRALINE HCL 50 MG PO TABS: 50.0000 mg | ORAL_TABLET | Freq: Every day | ORAL | Status: DC
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
## 2013-06-19 ENCOUNTER — Encounter: Payer: Self-pay | Admitting: Family Medicine

## 2013-06-19 ENCOUNTER — Ambulatory Visit (INDEPENDENT_AMBULATORY_CARE_PROVIDER_SITE_OTHER): Payer: Medicaid Other | Admitting: Family Medicine

## 2013-06-19 VITALS — BP 120/92 | HR 80 | Temp 98.5°F | Resp 16 | Wt 212.0 lb

## 2013-06-19 DIAGNOSIS — R000 Tachycardia, unspecified: Secondary | ICD-10-CM

## 2013-06-19 DIAGNOSIS — R0600 Dyspnea, unspecified: Secondary | ICD-10-CM

## 2013-06-19 DIAGNOSIS — R0989 Other specified symptoms and signs involving the circulatory and respiratory systems: Secondary | ICD-10-CM

## 2013-06-19 LAB — CBC WITH DIFFERENTIAL/PLATELET
Basophils Relative: 0 % (ref 0–1)
Eosinophils Absolute: 0 10*3/uL (ref 0.0–0.7)
HCT: 37.3 % (ref 36.0–46.0)
Hemoglobin: 12.6 g/dL (ref 12.0–15.0)
Lymphs Abs: 2.6 10*3/uL (ref 0.7–4.0)
MCH: 28.6 pg (ref 26.0–34.0)
MCHC: 33.8 g/dL (ref 30.0–36.0)
MCV: 84.8 fL (ref 78.0–100.0)
Monocytes Absolute: 0.5 10*3/uL (ref 0.1–1.0)
Monocytes Relative: 8 % (ref 3–12)
RBC: 4.4 MIL/uL (ref 3.87–5.11)

## 2013-06-19 NOTE — Progress Notes (Signed)
Subjective:    Patient ID: Janice Roberts, female    DOB: [birth year], 37 y.o.   MRN: [id number]  HPI  Patient reports several attacks of dyspnea, palpitations, and presyncope over the last 2 weeks. She states that beginning 2 weeks ago she had an attack, that was not prompted by exertion, where she became incredibly weak and, short of breath, and felt her heart racing. She had to sit down in the floor and wait for the attack to pass. Afterward she felt incredibly drained and had to lie down. She was then fine for 3 days. A second attack with similar symptoms that occurred. Again she had to lie down and go to sleep feeling extremely drained after the attack. Each attack lasts from minutes to an hour. Over the weekend she had attacks on Saturday and Sunday. Each attack is not prompted by exertion. They're associated with shortness of breath vague chest discomfort, and palpitations/tachycardia.   Past Medical History  Diagnosis Date  . Asthma   . Obesity   . Hypertension   . Thyroid disease    Current Outpatient Prescriptions on File Prior to Visit  Medication Sig Dispense Refill  . benazepril (LOTENSIN) 40 MG tablet Take 1 tablet (40 mg total) by mouth daily.  30 tablet  2  . hydrochlorothiazide (HYDRODIURIL) 25 MG tablet Take 1 tablet (25 mg total) by mouth daily.  30 tablet  2  . levothyroxine (SYNTHROID, LEVOTHROID) 150 MCG tablet TAKE ONE TABLET BY MOUTH ONCE DAILY  30 tablet  2  . medroxyPROGESTERone (DEPO-PROVERA) 150 MG/ML injection Inject 1 mL (150 mg total) into the muscle every 3 (three) months.  1 mL  3  . sertraline (ZOLOFT) 50 MG tablet Take 1 tablet (50 mg total) by mouth daily.  30 tablet  3   No current facility-administered medications on file prior to visit.   Allergies  Allergen Reactions  . Ivp Dye (Iodinated Diagnostic Agents) Other (See Comments)    Almost died.   History   Social History  . Marital Status: Single    Spouse Name: N/A    Number of Children: N/A   . Years of Education: N/A   Occupational History  . Not on file.   Social History Main Topics  . Smoking status: Never Smoker   . Smokeless tobacco: Never Used  . Alcohol Use: No  . Drug Use: No  . Sexually Active: Not on file   Other Topics Concern  . Not on file   Social History Narrative  . No narrative on file   Family History  Problem Relation Age of Onset  . Cancer Mother 61    Breast  . Asthma Sister   . Asthma Brother   . Asthma Sister   . Birth defects Sister      Review of Systems  All other systems reviewed and are negative.       Objective:   Physical Exam  Vitals reviewed. Constitutional: She is oriented to person, place, and time. She appears well-developed and well-nourished.  HENT:  Mouth/Throat: Oropharynx is clear and moist.  Eyes: Conjunctivae are normal. Pupils are equal, round, and reactive to light. No scleral icterus.  Neck: Neck supple. No JVD present. No thyromegaly present.  Cardiovascular: Normal rate, regular rhythm, normal heart sounds and intact distal pulses.  Exam reveals no gallop and no friction rub.   No murmur heard. Pulmonary/Chest: Effort normal and breath sounds normal. No respiratory distress. She has no wheezes. She has  no rales. She exhibits no tenderness.  Abdominal: Soft. Bowel sounds are normal. She exhibits no distension and no mass. There is no tenderness. There is no rebound and no guarding.  Lymphadenopathy:    She has no cervical adenopathy.  Neurological: She is alert and oriented to person, place, and time. She has normal reflexes. She displays normal reflexes. No cranial nerve deficit. She exhibits normal muscle tone. Coordination normal.          Assessment & Plan:  1. Tachycardia - Holter monitor - 48 hour; Future - CBC with Differential - COMPLETE METABOLIC PANEL WITH GFR - TSH  2. Dyspnea - EKG 12-Lead - CBC with Differential - COMPLETE METABOLIC PANEL WITH GFR - TSH  EKG today shows normal  sinus rhythm at 72 beats per minute with a normal axis normal intervals and no evidence of ischemia or infarction. There is no evidence of Wolff-Parkinson-White or Brugada syndrome. She has a normal QT interval.  Began by checking some basic lab work including a CBC, CMP, TSH. I also obtain a 48-hour Holter monitor. The patient's symptoms sound concerning for possibly some sort of cardiac arrhythmia such as supraventricular tachycardia. The possibility or panic attacks. Of note she began Zoloft weeks ago for generalized anxiety disorder. However today in the clinic the patient declines any anxiety and states that this is nothing like an anxiety attack.

## 2013-06-20 LAB — COMPLETE METABOLIC PANEL WITH GFR
Albumin: 4.4 g/dL (ref 3.5–5.2)
Alkaline Phosphatase: 61 U/L (ref 39–117)
BUN: 14 mg/dL (ref 6–23)
CO2: 26 mEq/L (ref 19–32)
GFR, Est African American: >89 mL/min
GFR, Est Non African American: 82 mL/min
Glucose, Bld: 92 mg/dL (ref 70–99)
Sodium: 138 mEq/L (ref 135–145)
Total Bilirubin: 1 mg/dL (ref 0.3–1.2)
Total Protein: 7.8 g/dL (ref 6.0–8.3)

## 2013-06-20 LAB — TSH: TSH: 0.145 u[IU]/mL — ABNORMAL LOW (ref 0.350–4.500)

## 2013-06-25 ENCOUNTER — Other Ambulatory Visit: Payer: Self-pay | Admitting: Family Medicine

## 2013-06-25 MED ORDER — LEVOTHYROXINE SODIUM 125 MCG PO TABS: 125.0000 ug | ORAL_TABLET | Freq: Every day | ORAL | Status: DC

## 2013-06-25 NOTE — Telephone Encounter (Signed)
Med sent to pharmacy.

## 2013-06-26 ENCOUNTER — Encounter (INDEPENDENT_AMBULATORY_CARE_PROVIDER_SITE_OTHER): Payer: Medicaid Other

## 2013-06-26 ENCOUNTER — Encounter: Payer: Self-pay | Admitting: *Deleted

## 2013-06-26 DIAGNOSIS — R000 Tachycardia, unspecified: Secondary | ICD-10-CM

## 2013-06-26 NOTE — Progress Notes (Signed)
Patient ID: Janice Roberts, female   DOB: 07-07-1976, 37 y.o.   MRN: [id number] E-Cardio 48 Hour Holter monitor applied to patient.

## 2013-07-04 ENCOUNTER — Ambulatory Visit: Payer: Medicaid Other | Admitting: Physician Assistant

## 2013-07-05 ENCOUNTER — Encounter: Payer: Self-pay | Admitting: Physician Assistant

## 2013-07-05 ENCOUNTER — Ambulatory Visit (INDEPENDENT_AMBULATORY_CARE_PROVIDER_SITE_OTHER): Payer: Medicaid Other | Admitting: Physician Assistant

## 2013-07-05 VITALS — BP 102/66 | HR 64 | Temp 98.9°F | Resp 18 | Wt 212.0 lb

## 2013-07-05 DIAGNOSIS — F419 Anxiety disorder, unspecified: Secondary | ICD-10-CM | POA: Insufficient documentation

## 2013-07-05 DIAGNOSIS — F411 Generalized anxiety disorder: Secondary | ICD-10-CM

## 2013-07-05 DIAGNOSIS — F329 Major depressive disorder, single episode, unspecified: Secondary | ICD-10-CM | POA: Insufficient documentation

## 2013-07-05 MED ORDER — SERTRALINE HCL 100 MG PO TABS: 100.0000 mg | ORAL_TABLET | Freq: Every day | ORAL | Status: DC
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
## 2013-07-22 ENCOUNTER — Emergency Department (HOSPITAL_COMMUNITY)
Admission: EM | Admit: 2013-07-22 | Discharge: 2013-07-23 | Disposition: A | Discharge status: Other Acute Inpatient Hospital | Payer: Medicaid Other | Attending: Emergency Medicine | Admitting: Emergency Medicine

## 2013-07-22 ENCOUNTER — Encounter (HOSPITAL_COMMUNITY): Payer: Self-pay | Admitting: Emergency Medicine

## 2013-07-22 DIAGNOSIS — R45851 Suicidal ideations: Secondary | ICD-10-CM | POA: Insufficient documentation

## 2013-07-22 DIAGNOSIS — R443 Hallucinations, unspecified: Secondary | ICD-10-CM | POA: Insufficient documentation

## 2013-07-22 DIAGNOSIS — F3289 Other specified depressive episodes: Secondary | ICD-10-CM | POA: Insufficient documentation

## 2013-07-22 DIAGNOSIS — F411 Generalized anxiety disorder: Secondary | ICD-10-CM | POA: Insufficient documentation

## 2013-07-22 DIAGNOSIS — E079 Disorder of thyroid, unspecified: Secondary | ICD-10-CM | POA: Insufficient documentation

## 2013-07-22 DIAGNOSIS — Z79899 Other long term (current) drug therapy: Secondary | ICD-10-CM | POA: Insufficient documentation

## 2013-07-22 DIAGNOSIS — F329 Major depressive disorder, single episode, unspecified: Secondary | ICD-10-CM | POA: Insufficient documentation

## 2013-07-22 DIAGNOSIS — J45909 Unspecified asthma, uncomplicated: Secondary | ICD-10-CM | POA: Insufficient documentation

## 2013-07-22 DIAGNOSIS — IMO0002 Reserved for concepts with insufficient information to code with codable children: Secondary | ICD-10-CM | POA: Insufficient documentation

## 2013-07-22 DIAGNOSIS — Z880 Allergy status to penicillin: Secondary | ICD-10-CM | POA: Insufficient documentation

## 2013-07-22 DIAGNOSIS — E669 Obesity, unspecified: Secondary | ICD-10-CM | POA: Insufficient documentation

## 2013-07-22 DIAGNOSIS — I10 Essential (primary) hypertension: Secondary | ICD-10-CM | POA: Insufficient documentation

## 2013-07-22 LAB — COMPREHENSIVE METABOLIC PANEL
ALT: 12 U/L (ref 0–35)
Calcium: 9.4 mg/dL (ref 8.4–10.5)
GFR calc Af Amer: >90 mL/min (ref >90)
Glucose, Bld: 101 mg/dL — ABNORMAL HIGH (ref 70–99)
Sodium: 137 mEq/L (ref 135–145)
Total Protein: 8 g/dL (ref 6.0–8.3)

## 2013-07-22 LAB — ETHANOL: Alcohol, Ethyl (B): <11 mg/dL (ref 0–11)

## 2013-07-22 LAB — RAPID URINE DRUG SCREEN, HOSP PERFORMED
Cocaine: NOT DETECTED
Opiates: NOT DETECTED
Tetrahydrocannabinol: NOT DETECTED

## 2013-07-22 LAB — CBC
Hemoglobin: 12.4 g/dL (ref 12.0–15.0)
MCH: 29 pg (ref 26.0–34.0)
MCHC: 34.5 g/dL (ref 30.0–36.0)

## 2013-07-22 MED ORDER — MEDROXYPROGESTERONE ACETATE 150 MG/ML IM SUSP: 150.0000 mg | INTRAMUSCULAR | Status: DC

## 2013-07-22 MED ORDER — ALUM & MAG HYDROXIDE-SIMETH 200-200-20 MG/5ML PO SUSP: 30.0000 mL | ORAL | Status: DC | PRN

## 2013-07-22 MED ORDER — ACETAMINOPHEN 325 MG PO TABS: 650.0000 mg | ORAL_TABLET | ORAL | Status: DC | PRN

## 2013-07-22 MED ORDER — IBUPROFEN 200 MG PO TABS: 600.0000 mg | ORAL_TABLET | Freq: Three times a day (TID) | ORAL | Status: DC | PRN

## 2013-07-22 MED ORDER — NICOTINE 21 MG/24HR TD PT24
21.0000 mg | MEDICATED_PATCH | Freq: Every day | TRANSDERMAL | Status: DC
  Filled 2013-07-22: qty 1

## 2013-07-22 MED ORDER — BENAZEPRIL HCL 40 MG PO TABS
40.0000 mg | ORAL_TABLET | Freq: Every day | ORAL | Status: DC
  Administered 2013-07-22 – 2013-07-23 (×2): 40 mg via ORAL
  Filled 2013-07-22 (×2): qty 1

## 2013-07-22 MED ORDER — HYDROCHLOROTHIAZIDE 25 MG PO TABS
25.0000 mg | ORAL_TABLET | Freq: Every day | ORAL | Status: DC
  Administered 2013-07-22 – 2013-07-23 (×2): 25 mg via ORAL
  Filled 2013-07-22 (×2): qty 1

## 2013-07-22 MED ORDER — SERTRALINE HCL 50 MG PO TABS
100.0000 mg | ORAL_TABLET | Freq: Every day | ORAL | Status: DC
  Administered 2013-07-22 – 2013-07-23 (×2): 100 mg via ORAL
  Filled 2013-07-22 (×2): qty 2

## 2013-07-22 MED ORDER — MOMETASONE FURO-FORMOTEROL FUM 100-5 MCG/ACT IN AERO
2.0000 | INHALATION_SPRAY | Freq: Two times a day (BID) | RESPIRATORY_TRACT | Status: DC
  Administered 2013-07-22 – 2013-07-23 (×2): 2 via RESPIRATORY_TRACT
  Filled 2013-07-22 (×2): qty 8.8

## 2013-07-22 MED ORDER — LEVOTHYROXINE SODIUM 125 MCG PO TABS
125.0000 ug | ORAL_TABLET | Freq: Every day | ORAL | Status: DC
  Administered 2013-07-23: 125 ug via ORAL
  Filled 2013-07-22 (×2): qty 1

## 2013-07-22 MED ORDER — ZOLPIDEM TARTRATE 5 MG PO TABS
5.0000 mg | ORAL_TABLET | Freq: Every evening | ORAL | Status: DC | PRN
  Filled 2013-07-22: qty 1

## 2013-07-22 MED ORDER — LORAZEPAM 1 MG PO TABS: 1.0000 mg | ORAL_TABLET | Freq: Three times a day (TID) | ORAL | Status: DC | PRN

## 2013-07-22 MED ORDER — ONDANSETRON HCL 4 MG PO TABS: 4.0000 mg | ORAL_TABLET | Freq: Three times a day (TID) | ORAL | Status: DC | PRN
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
## 2013-07-22 NOTE — ED Provider Notes (Signed)
CSN: [id number]     Arrival date & time 07/22/13  1935 History  This chart was scribed for non-physician practitioner, Junious Silk PA-C,  working with Audree Camel, MD by Arlan Organ, ED Scribe. This patient was seen in room D34C/D34C and the patient's care was started at 8:50 PM.    Chief Complaint  Patient presents with  . Suicidal   The history is provided by the patient. No language interpreter was used.   HPI Comments: Janice Roberts is a 37 y.o. female who presents to the Emergency Department complaining of gradually worsening suicidal thoughts that started 3 months ago. Pt states she does have a plan for suicide consisting of: Overdosing on pills, cutting herself, jumping in front of a car, or driving her car off the road. Pt states she does not have any previous hx of attempting to commit suicide. Pt states she does experience hallucinations and hears and sees her brother. Pt states the voices tell "her to kill herself". Pt also states she has not had thoughts of hurting others. Pt states she has a medical history of asthma, HTN, depression, and thyroid disease. Pt recently ran out of her medications and just received her refills. Pt states she did see a psychiatrist in the past when she lived in [place]. However, she does not currently have a psychiatrist. Pt states she does not have a history of drugs or alcohol. Pt states she currently lives with her 2 daughters of 74 and 52 years old.  Past Medical History  Diagnosis Date  . Asthma   . Obesity   . Hypertension   . Thyroid disease   . Anxiety   . Depression    Past Surgical History  Procedure Laterality Date  . Thyroidectomy    . Cesarean section    . Appendectomy    . Dilation and curettage of uterus  08/08/2008 & 03/08/2013   Family History  Problem Relation Age of Onset  . Cancer Mother 73    Breast  . Asthma Sister   . Asthma Brother   . Asthma Sister   . Birth defects Sister    History  Substance Use  Topics  . Smoking status: Never Smoker   . Smokeless tobacco: Never Used  . Alcohol Use: No   OB History   Grav Para Term Preterm Abortions TAB SAB Ect Mult Living                 Review of Systems  Psychiatric/Behavioral: Positive for suicidal ideas and hallucinations.  All other systems reviewed and are negative.    Allergies  Ivp dye and Penicillins  Home Medications   Current Outpatient Rx  Name  Route  Sig  Dispense  Refill  . benazepril (LOTENSIN) 40 MG tablet   Oral   Take 1 tablet (40 mg total) by mouth daily.   30 tablet   2   . Fluticasone-Salmeterol (ADVAIR) 100-50 MCG/DOSE AEPB   Inhalation   Inhale 1 puff into the lungs daily as needed. For asthma         . hydrochlorothiazide (HYDRODIURIL) 25 MG tablet   Oral   Take 1 tablet (25 mg total) by mouth daily.   30 tablet   2   . levothyroxine (SYNTHROID, LEVOTHROID) 125 MCG tablet   Oral   Take 1 tablet (125 mcg total) by mouth daily before breakfast.   30 tablet   3   . medroxyPROGESTERone (DEPO-PROVERA) 150 MG/ML injection  Intramuscular   Inject 1 mL (150 mg total) into the muscle every 3 (three) months.   1 mL   3   . sertraline (ZOLOFT) 100 MG tablet   Oral   Take 1 tablet (100 mg total) by mouth daily.   30 tablet   3    BP 146/107  Pulse 89  Temp(Src) 98.4 F (36.9 C) (Oral)  Resp 15  Ht 5\' 2"  (1.575 m)  Wt 217 lb 4.8 oz (98.567 kg)  BMI 39.73 kg/m2  SpO2 98%  LMP 07/19/2013 Physical Exam  Nursing note and vitals reviewed. Constitutional: She is oriented to person, place, and time. She appears well-developed and well-nourished. No distress.  HENT:  Head: Normocephalic and atraumatic.  Right Ear: External ear normal.  Left Ear: External ear normal.  Nose: Nose normal.  Mouth/Throat: Oropharynx is clear and moist.  Eyes: Conjunctivae are normal.  Neck: Normal range of motion.  Cardiovascular: Normal rate, regular rhythm and normal heart sounds.   Pulmonary/Chest: Effort  normal and breath sounds normal. No stridor. No respiratory distress. She has no wheezes. She has no rales.  Abdominal: Soft. She exhibits no distension.  Musculoskeletal: Normal range of motion.  Neurological: She is alert and oriented to person, place, and time. She has normal strength.  Skin: Skin is warm and dry. She is not diaphoretic. No erythema.  Psychiatric: Her behavior is normal. She exhibits a depressed mood. She expresses suicidal ideation. She expresses no homicidal ideation. She expresses suicidal plans.  tearful    ED Course  Procedures (including critical care time)  DIAGNOSTIC STUDIES: Oxygen Saturation is 98% on RA, Normal by my interpretation.    COORDINATION OF CARE: 8:55 PM-Discussed treatment plan which includes consult to ACT team for placement with pt at bedside and pt agreed to plan.       Labs Review Labs Reviewed  CBC - Abnormal; Notable for the following:    HCT 35.9 (*)    RDW 15.7 (*)    All other components within normal limits  COMPREHENSIVE METABOLIC PANEL - Abnormal; Notable for the following:    Potassium 3.4 (*)    Glucose, Bld 101 (*)    All other components within normal limits  SALICYLATE LEVEL - Abnormal; Notable for the following:    Salicylate Lvl <2.0 (*)    All other components within normal limits  ACETAMINOPHEN LEVEL  ETHANOL  URINE RAPID DRUG SCREEN (HOSP PERFORMED)  POCT PREGNANCY, URINE   Imaging Review No results found.  MDM  No diagnosis found.  Patient presents with suicidal ideations. No prior suicide attempt. No drug or EtOH use. She is medically cleared. ACT team has evaluated patient and she has a bed at [facility], awaiting transfer when bed becomes available. Vital signs stable.   I personally performed the services described in this documentation, which was scribed in my presence. The recorded information has been reviewed and is accurate.     Mora Bellman, PA-C 07/22/13 [id number]

## 2013-07-22 NOTE — ED Notes (Signed)
Pt belongings given to family per pt request.
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
## 2013-07-22 NOTE — ED Notes (Signed)
C/o suicidal ideation since June.  Pt states she has a plan to overdose on pills, cut herself, or wreck her car.  Reports visual hallucinations.

## 2013-07-22 NOTE — ED Notes (Signed)
Pt changed into paper scrubs, AC called for sitter, and security called to wand pt.

## 2013-07-23 ENCOUNTER — Encounter (HOSPITAL_COMMUNITY): Payer: Self-pay | Admitting: *Deleted

## 2013-07-23 ENCOUNTER — Inpatient Hospital Stay (HOSPITAL_COMMUNITY)
Admission: AD | Admit: 2013-07-23 | Discharge: 2013-07-27 | DRG: 882 | Disposition: A | Discharge status: Home / Self Care | Payer: Medicaid Other | Source: Intra-hospital | Attending: Psychiatry | Admitting: Psychiatry

## 2013-07-23 DIAGNOSIS — E669 Obesity, unspecified: Secondary | ICD-10-CM

## 2013-07-23 DIAGNOSIS — F419 Anxiety disorder, unspecified: Secondary | ICD-10-CM

## 2013-07-23 DIAGNOSIS — Z79899 Other long term (current) drug therapy: Secondary | ICD-10-CM

## 2013-07-23 DIAGNOSIS — F411 Generalized anxiety disorder: Secondary | ICD-10-CM | POA: Diagnosis present

## 2013-07-23 DIAGNOSIS — R45851 Suicidal ideations: Secondary | ICD-10-CM

## 2013-07-23 DIAGNOSIS — F4310 Post-traumatic stress disorder, unspecified: Principal | ICD-10-CM | POA: Diagnosis present

## 2013-07-23 DIAGNOSIS — F329 Major depressive disorder, single episode, unspecified: Secondary | ICD-10-CM

## 2013-07-23 DIAGNOSIS — E079 Disorder of thyroid, unspecified: Secondary | ICD-10-CM

## 2013-07-23 DIAGNOSIS — J45909 Unspecified asthma, uncomplicated: Secondary | ICD-10-CM | POA: Diagnosis present

## 2013-07-23 DIAGNOSIS — I10 Essential (primary) hypertension: Secondary | ICD-10-CM | POA: Diagnosis present

## 2013-07-23 MED ORDER — MAGNESIUM HYDROXIDE 400 MG/5ML PO SUSP: 30.0000 mL | Freq: Every day | ORAL | Status: DC | PRN

## 2013-07-23 MED ORDER — LEVOTHYROXINE SODIUM 125 MCG PO TABS
125.0000 ug | ORAL_TABLET | Freq: Every day | ORAL | Status: DC
  Administered 2013-07-24 – 2013-07-27 (×4): 125 ug via ORAL
  Filled 2013-07-23 (×6): qty 1

## 2013-07-23 MED ORDER — ALUM & MAG HYDROXIDE-SIMETH 200-200-20 MG/5ML PO SUSP: 30.0000 mL | ORAL | Status: DC | PRN

## 2013-07-23 MED ORDER — MOMETASONE FURO-FORMOTEROL FUM 100-5 MCG/ACT IN AERO
2.0000 | INHALATION_SPRAY | Freq: Two times a day (BID) | RESPIRATORY_TRACT | Status: DC
  Administered 2013-07-24 – 2013-07-27 (×7): 2 via RESPIRATORY_TRACT
  Filled 2013-07-23: qty 8.8

## 2013-07-23 MED ORDER — BENAZEPRIL HCL 40 MG PO TABS
40.0000 mg | ORAL_TABLET | Freq: Every day | ORAL | Status: DC
  Administered 2013-07-24 – 2013-07-27 (×4): 40 mg via ORAL
  Filled 2013-07-23 (×5): qty 1

## 2013-07-23 MED ORDER — TRAZODONE HCL 50 MG PO TABS
50.0000 mg | ORAL_TABLET | Freq: Every evening | ORAL | Status: DC | PRN
  Administered 2013-07-24 – 2013-07-26 (×4): 50 mg via ORAL
  Filled 2013-07-23 (×4): qty 1

## 2013-07-23 MED ORDER — HYDROCHLOROTHIAZIDE 25 MG PO TABS
25.0000 mg | ORAL_TABLET | Freq: Every day | ORAL | Status: DC
  Administered 2013-07-24 – 2013-07-27 (×4): 25 mg via ORAL
  Filled 2013-07-23 (×5): qty 1

## 2013-07-23 MED ORDER — ACETAMINOPHEN 325 MG PO TABS: 650.0000 mg | ORAL_TABLET | Freq: Four times a day (QID) | ORAL | Status: DC | PRN

## 2013-07-23 NOTE — BH Assessment (Signed)
Maryjean Morn, PA accepted Pt to [facility] pending 500-hall bed. Contacted the following facilities for placement:   [facility]: Per Lura Em, at capacity  [facility]: Per [employer], at [employer]: Per [place], at capacity  [facility]: Per [employer], at [employer]: Per [place], at [facility]: At capacity  [facility]: Per Jennette Kettle, at capacity  [facility]: Per Shawna Orleans, at capacity  [facility]: At capacity  [facility]: Per [place], at capacity  [facility]: Per [place]eter [place], at [facility]: At capacity  [facility]: At capacity  [facility]: Per Leonette Most, at capacity  [facility]: Per Darl Pikes, at capacity   Harlin Rain Patsy [place], [facility], [facility]  Triage Specialist

## 2013-07-23 NOTE — Tx Team (Signed)
Initial Interdisciplinary Treatment Plan  PATIENT STRENGTHS: (choose at least two) Ability for insight Average or above average intelligence Communication skills Supportive family/friends Work skills  PATIENT STRESSORS: Financial difficulties Health problems Loss of relationship with BF Occupational concerns   PROBLEM LIST: Problem List/Patient Goals Date to be addressed Date deferred Reason deferred Estimated date of resolution  SI 07-23-13     HTN 07-23-13     Asthma 07-23-13     Thyroid disease 07-23-13                                    DISCHARGE CRITERIA:  Improved stabilization in mood, thinking, and/or behavior Motivation to continue treatment in a less acute level of care Need for constant or close observation no longer present Reduction of life-threatening or endangering symptoms to within safe limits Verbal commitment to aftercare and medication compliance  PRELIMINARY DISCHARGE PLAN: Attend PHP/IOP Outpatient therapy Participate in family therapy Return to previous living arrangement  PATIENT/FAMIILY INVOLVEMENT: This treatment plan has been presented to and reviewed with the patient, Janice Roberts, and/or family member.  The patient and family have been given the opportunity to ask questions and make suggestions.  [name] 07/23/2013, 10:48 PM

## 2013-07-23 NOTE — ED Provider Notes (Signed)
Medical screening examination/treatment/procedure(s) were performed by non-physician practitioner and as supervising physician I was immediately available for consultation/collaboration.   Audree Camel, MD 07/23/13 1250
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
## 2013-07-23 NOTE — ED Provider Notes (Signed)
8:41 PM  Pt to be transferred over to behavioral health for suicidal ideation. She began discharge from the emergency department and taken over to behavioral health for security. I was not involved in this patient's care.  Layla Maw Sherrice Creekmore, DO 07/23/13 2042

## 2013-07-23 NOTE — ED Provider Notes (Signed)
Pt resting comfortably, no acute issues at this time.  She has been accepted to [facility] and awaiting for bed availability BP 100/68  Pulse 72  Temp(Src) 98.8 F (37.1 C) (Oral)  Resp 18  Ht 5\' 2"  (1.575 m)  Wt 217 lb 4.8 oz (98.567 kg)  BMI 39.73 kg/m2  SpO2 95%  LMP 07/19/2013   Joya Gaskins, MD 07/23/13 [phone]

## 2013-07-23 NOTE — ED Notes (Signed)
Resting at this time; sitter at bedside

## 2013-07-23 NOTE — ED Notes (Signed)
Vitals taken; resting at this time; sitter at bedside 

## 2013-07-24 MED ORDER — PRAZOSIN HCL 1 MG PO CAPS
1.0000 mg | ORAL_CAPSULE | Freq: Every day | ORAL | Status: DC
  Administered 2013-07-24 – 2013-07-26 (×3): 1 mg via ORAL
  Filled 2013-07-24 (×5): qty 1

## 2013-07-24 MED ORDER — ARIPIPRAZOLE 5 MG PO TABS
5.0000 mg | ORAL_TABLET | Freq: Every day | ORAL | Status: DC
  Administered 2013-07-25 – 2013-07-27 (×3): 5 mg via ORAL
  Filled 2013-07-24 (×6): qty 1
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
## 2013-07-24 NOTE — Progress Notes (Signed)
Patient ID: Janice Roberts, female   DOB: Oct 18, 1976, 37 y.o.   MRN: [id number] D- Patient reports poor sleep and poor appetite.  Her energy level is low and her ability to pay attention is good.  She rates her depression at 8 and her anxiety at 0. She has been having thoughts of self harm and she does contract for safety.  She slept most of morning but has been up and in [place] this afternoon.
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
## 2013-07-24 NOTE — Progress Notes (Signed)
Adult Psychoeducational Group Note  Date:  07/24/2013 Time:  10:51 PM  Group Topic/Focus:  Goals Group:   The focus of this group is to help patients establish daily goals to achieve during treatment and discuss how the patient can incorporate goal setting into their daily lives to aide in recovery.  Participation Level:  Active  Participation Quality:  Appropriate  Affect:  Appropriate  Cognitive:  Appropriate  Insight: Appropriate  Engagement in Group:  Engaged  Modes of Intervention:  Discussion  Additional Comments:  Pt stated that she had a good day, day went well when she got to see her children.  Terie Purser R 07/24/2013, 10:51 PM
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
## 2013-07-24 NOTE — Progress Notes (Signed)
Patient did not attend 0900 RN group. 

## 2013-07-24 NOTE — Progress Notes (Signed)
Recreation Therapy Notes  Date: 09.16.2014 Time: 2:45pm Location: 500 Hall Dayroom  Group Topic: Animal Assisted Activities (AAA)  Behavioral Response: Did not attend. Per patient consent patient refused all AAA services during admission.    Marykay Lex Shogo Larkey, LRT/CTRS  Jearl Klinefelter 07/24/2013 4:48 PM

## 2013-07-24 NOTE — [facility] Group Notes (Signed)
[facility] LCSW Group Therapy      Feelings About Diagnosis 1:15 - 2:30 PM         07/24/2013  2:58 PM    Type of Therapy:  Group Therapy  Participation Level:  Did not attend group.    Wynn Banker 07/24/2013  2:58 PM
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
## 2013-07-25 DIAGNOSIS — F4310 Post-traumatic stress disorder, unspecified: Principal | ICD-10-CM

## 2013-07-25 DIAGNOSIS — F323 Major depressive disorder, single episode, severe with psychotic features: Secondary | ICD-10-CM

## 2013-07-25 MED ORDER — SERTRALINE HCL 100 MG PO TABS
100.0000 mg | ORAL_TABLET | Freq: Every day | ORAL | Status: DC
  Administered 2013-07-25 – 2013-07-27 (×3): 100 mg via ORAL
  Filled 2013-07-25 (×5): qty 1
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
## 2013-07-25 NOTE — Progress Notes (Signed)
Recreation Therapy Notes  Date: 09.17.2014 Time: 3:00pm Location: 500 [address] Dayroom  Group Topic: Communication, Team Building, Problem Solving  Goal Area(s) Addresses:  Patient will effectively work with peer towards shared goal.  Patient will identify skill used to make activity successful.  Patient will identify how skills used during activity can be used to reach post d/c goals.   Behavioral Response: Engaged, Attentive, Appropriate  Intervention: Problem Solving Task  Activity: [employer]. In small groups (3-4 patients) patients were given the following supplies and were asked to work together to build a launching mechanism to launch a ping pong ball 12 feet. Supplies: 5 drinking straws, 5 rubber bands, 5 paper clips, 2 index cards, 2 paper cups, and 2 toilet paper rolls.    Education: Customer service manager, Discharge Planning   Education Outcome: Acknowledges understanding  Clinical Observations/Feedback: Patient contributed to opening discussion, defining personal development for group. Patient emerged as leader in her group, taking lead on building teams launching mechanism as well as working well with peers and taking their ideas into consideration. Patient contributed to wrap up discussion, identifying communication as a skill necessary to make activity successful. Additionally patient defined qualities that make communication and team work successful. Patient additionally recognized connection between good communication and building a healthy support system. Additionally patient appeared to actively listen to peer statements as she maintained appropriate eye contact with speaker.   Marykay Lex Jaceyon Strole, LRT/CTRS  Jearl Klinefelter 07/25/2013 4:42 PM

## 2013-07-25 NOTE — Progress Notes (Signed)
Adult Psychoeducational Group Note  Date:  07/25/2013 Time:  10:00 11:00 Group Topic/Focus:  Therapeutic Activity and Personal Development Groups  Participation Level:  Did Not Attend  Participation Quality:    Affect:    Cognitive:    Insight:   Engagement in Group:    Modes of Intervention:   Additional Comments:  Pt did not attend group  Shelly Bombard D 07/25/2013, 11:38 AM
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
## 2013-07-25 NOTE — Tx Team (Signed)
Interdisciplinary Treatment Plan Update (Adult)  Date: 07/25/2013  Time Reviewed:  9:45 AM  Progress in Treatment: Attending groups: Yes Participating in groups:  Yes Taking medication as prescribed:  Yes Tolerating medication:  Yes Family/Significant othe contact made: Yes Patient understands diagnosis:  Yes Discussing patient identified problems/goals with staff:  Yes Medical problems stabilized or resolved:  Yes Denies suicidal/homicidal ideation: Yes Issues/concerns per patient self-inventory:  Yes Other:  New problem(s) identified: N/A  Discharge Plan or Barriers: Pt will [facility] up at [facility] and therapy.    Reason for Continuation of Hospitalization: Anxiety Depression Medication Stabilization  Comments: N/A  Estimated length of stay: 2 days, possibly d/c Friday  For review of initial/current patient goals, please see plan of care.  Attendees: Patient:     Family:     Physician:  Dr. Javier Glazier 07/25/2013 10:18 AM   Nursing:   Burnetta Sabin, RN 07/25/2013 10:18 AM   Clinical Social Worker:  Reyes Ivan, LCSWA 07/25/2013 10:18 AM   Other: Verne Spurr, PA 07/25/2013 10:18 AM   Other:  Frankey Shown, MA care coordination 07/25/2013 10:18 AM   Other:  Juline Patch, LCSW 07/25/2013 10:18 AM   Other:  Nestor Ramp, RN 07/25/2013 10:18 AM   Other: Onnie Boer, RN case manager 07/25/2013 10:18 AM   Other:    Other:    Other:    Other:    Other:     Scribe for Treatment Team:   Carmina Miller, 07/25/2013 10:18 AM

## 2013-07-25 NOTE — Progress Notes (Signed)
Adult Psychoeducational Group Note  Date:  07/25/2013 Time:  9:32 PM  Group Topic/Focus:  Wrap-Up Group:   The focus of this group is to help patients review their daily goal of treatment and discuss progress on daily workbooks.  Participation Level:  Active  Participation Quality:  Appropriate  Affect:  Flat  Cognitive:  Appropriate  Insight: Appropriate  Engagement in Group:  Improving and Lacking  Modes of Intervention:  Exploration  Additional Comments:   Pt stated that the good thing that happened to her today was that God blessed her to wake up and that she was able to see her children and family. Pt seemed flat and not really wanting to share. When staff tried to pull more out of her she was very short and seemed to be ready for group to be through.   Yobany Vroom 07/25/2013, 9:32 PM

## 2013-07-25 NOTE — [facility] Group Notes (Signed)
[facility] LCSW Aftercare Discharge Planning Group Note   07/25/2013  8:45 AM  Participation Quality:  Alert and Appropriate   Mood/Affect:  Appropriate and Calm  Depression Rating:  8  Anxiety Rating:  8  Thoughts of Suicide:  Pt denies SI/HI  Will you contract for safety?   Yes  Current AVH:  Pt denies  Plan for Discharge/Comments:  Pt attended discharge planning group and actively participated in group.  CSW provided pt with today's workbook.  Pt reports feeling sleepy today.  Pt will return home to family in [place].  Pt has follow up scheduled at [facility] for medication management and therapy.  No further needs voiced by pt at this time.    Transportation Means: Pt reports access to transportation - daughter will pick pt up  Supports: Reports family/daughters are supportive  Reyes Ivan, LCSWA 07/25/2013 9:46 AM

## 2013-07-25 NOTE — Progress Notes (Signed)
.   NUTRITION ASSESSMENT  Pt identified as at risk on the Malnutrition Screen Tool  INTERVENTION: 1. Educated patient on the importance of nutrition and encouraged intake of food and beverages. 2. Discussed weight goals.   NUTRITION DIAGNOSIS: Unintentional weight loss related to sub-optimal intake as evidenced by pt report.   Goal: Pt to meet >/= 90% of their estimated nutrition needs.  Monitor:  PO intake  Assessment:  Patient admitted with PTSD.  Patient reports improved intake since admit but poor prior to admit secondary to worsening symptoms.  Patient reports weight loss of unknown amounts over the past 3 months since symptoms have increased.  37 y.o. female  Height: Ht Readings from Last 1 Encounters:  07/23/13 5\' 3"  (1.6 m)    Weight: Wt Readings from Last 1 Encounters:  07/23/13 212 lb 8 oz (96.389 kg)    Weight Hx: Wt Readings from Last 10 Encounters:  07/23/13 212 lb 8 oz (96.389 kg)  07/22/13 217 lb 4.8 oz (98.567 kg)  07/05/13 212 lb (96.163 kg)  06/19/13 212 lb (96.163 kg)  05/21/13 212 lb (96.163 kg)  05/02/13 261 lb (118.389 kg)    BMI:  Body mass index is 37.65 kg/(m^2). Pt meets criteria for Obesity grade 2 based on current BMI.  Estimated Nutritional Needs: Kcal: 25-30 kcal/kg Protein: > 1 gram protein/kg Fluid: 1 ml/kcal  Diet Order: General Pt is also offered choice of unit snacks mid-morning and mid-afternoon.  Pt is eating as desired.   Lab results and medications reviewed.   Janice Roberts, RD, LDN Clinical Inpatient Dietitian Pager:  [phone] Weekend and after hours pager:  [phone]

## 2013-07-25 NOTE — Progress Notes (Signed)
Patient ID: Janice Roberts, female   DOB: 20-Aug-1976, 37 y.o.   MRN: [id number] D: pt. Reports groups been positive, "listening to people with similar problems and situations." "I wasn't talking, I asked to speak to the nurse  1:1 then I started talking. Pt. Reports having problems sleeping "I don't sleep" Pt. Says she wasn't sleeping at home either. "doctor said he was going to put me on some new medicine." Pt. Reports family/friends been visiting since admission. Pt. Smiling, seen interaction in dayroom. A: Writer introduced self to client and encouraged group. Staff will monitor q12min for safety. R: Pt. Is safe on the unit and attended group.

## 2013-07-25 NOTE — [facility] Suicide Risk Assessment (Signed)
[facility] INPATIENT:  Family/Significant Other Suicide Prevention Education  Suicide Prevention Education:  Education Completed; Janice Roberts - daughter [phone]),  (name of family member/significant other) has been identified by the patient as the family member/significant other with whom the patient will be residing, and identified as the person(s) who will aid the patient in the event of a mental health crisis (suicidal ideations/suicide attempt).  With written consent from the patient, the family member/significant other has been provided the following suicide prevention education, prior to the and/or following the discharge of the patient.  The suicide prevention education provided includes the following:  Suicide risk factors  Suicide prevention and interventions  National Suicide Hotline telephone number  [facility] assessment telephone number  [facility] Emergency Assistance 911  [facility] and/or Residential Mobile Crisis Unit telephone number  Request made of family/significant other to:  Remove weapons (e.g., guns, rifles, knives), all items previously/currently identified as safety concern.    Remove drugs/medications (over-the-counter, prescriptions, illicit drugs), all items previously/currently identified as a safety concern.  The family member/significant other verbalizes understanding of the suicide prevention education information provided.  The family member/significant other agrees to remove the items of safety concern listed above.  Janice Roberts 07/25/2013, 8:07 AM

## 2013-07-25 NOTE — Progress Notes (Signed)
[facility] MD Progress Note  07/25/2013 3:50 PM Janice Roberts  MRN:  [id number]  Subjective:  Patient is up and active in the unit milieu. States she can't sleep. She reports her depression is better and she took the Abilify and noted no problems with this. Her anxiety is decreasing and her suicidal ideation is decreasing.  Diagnosis:   DSM5: Schizophrenia Disorders:   Obsessive-Compulsive Disorders:   Trauma-Stressor Disorders:  Posttraumatic Stress Disorder (309.81) Substance/Addictive Disorders:   Depressive Disorders:  Major Depressive Disorder - with Psychotic Features (296.24) ADL's:  Intact  Sleep: Poor  Appetite:  Fair  Suicidal Ideation:  decreasing Homicidal Ideation:  denies AEB (as evidenced by):  Psychiatric Specialty Exam: Review of Systems  Constitutional: Negative.  Negative for fever, chills, weight loss, malaise/fatigue and diaphoresis.  HENT: Negative for congestion and sore throat.   Eyes: Negative for blurred vision, double vision and photophobia.  Respiratory: Negative for cough, shortness of breath and wheezing.   Cardiovascular: Negative for chest pain, palpitations and PND.  Gastrointestinal: Negative for heartburn, nausea, vomiting, abdominal pain, diarrhea and constipation.  Musculoskeletal: Negative for myalgias, joint pain and falls.  Neurological: Negative for dizziness, tingling, tremors, sensory change, speech change, focal weakness, seizures, loss of consciousness, weakness and headaches.  Endo/Heme/Allergies: Negative for polydipsia. Does not bruise/bleed easily.  Psychiatric/Behavioral: Negative for depression, suicidal ideas, hallucinations, memory loss and substance abuse. The patient is not nervous/anxious and does not have insomnia.     Blood pressure 120/86, pulse 84, temperature 98.2 F (36.8 C), temperature source Oral, resp. rate 16, height 5\' 3"  (1.6 m), weight 96.389 kg (212 lb 8 oz), last menstrual period 07/19/2013.Body mass index is  37.65 kg/(m^2).  General Appearance: Disheveled  Eye Solicitor::  Fair  Speech:  Clear and Coherent  Volume:  Normal  Mood:  Anxious  Affect:  Congruent  Thought Process:  Goal Directed  Orientation:  Full (Time, Place, and Person)  Thought Content:  WDL  Suicidal Thoughts:  Yes.  without intent/plan  Homicidal Thoughts:  No  Memory:  Immediate;   Fair  Judgement:  Impaired  Insight:  Shallow  Psychomotor Activity:  Normal  Concentration:  Fair  Recall:  Fair  Akathisia:  No  Handed:  Right  AIMS (if indicated):     Assets:  Communication Skills Housing Physical Health  Sleep:  Number of Hours: 6   Current Medications: Current Facility-Administered Medications  Medication Dose Route Frequency Provider Last Rate Last Dose  . acetaminophen (TYLENOL) tablet 650 mg  650 mg Oral Q6H PRN Evanna Janann August, NP      . alum & mag hydroxide-simeth (MAALOX/MYLANTA) 200-200-20 MG/5ML suspension 30 mL  30 mL Oral Q4H PRN Evanna Janann August, NP      . ARIPiprazole (ABILIFY) tablet 5 mg  5 mg Oral Daily Verne Spurr, PA-C   5 mg at 07/25/13 0734  . benazepril (LOTENSIN) tablet 40 mg  40 mg Oral Daily Evanna Cori Merry Proud, NP   40 mg at 07/25/13 0734  . hydrochlorothiazide (HYDRODIURIL) tablet 25 mg  25 mg Oral Daily Evanna Cori Merry Proud, NP   25 mg at 07/25/13 0734  . levothyroxine (SYNTHROID, LEVOTHROID) tablet 125 mcg  125 mcg Oral QAC breakfast Audrea Muscat, NP   125 mcg at 07/25/13 0734  . magnesium hydroxide (MILK OF MAGNESIA) suspension 30 mL  30 mL Oral Daily PRN Evanna Cori [place], NP      . mometasone-formoterol (DULERA) 100-5 MCG/ACT inhaler 2 puff  2 puff  Inhalation BID Audrea Muscat, NP   2 puff at 07/25/13 0735  . prazosin (MINIPRESS) capsule 1 mg  1 mg Oral QHS Verne Spurr, PA-C   1 mg at 07/24/13 2154  . sertraline (ZOLOFT) tablet 100 mg  100 mg Oral Daily Verne Spurr, PA-C   100 mg at 07/25/13 1120  . traZODone (DESYREL) tablet 50 mg  50 mg Oral QHS PRN,MR X  1 Evanna Janann August, NP   50 mg at 07/24/13 2353    Lab Results:  Results for orders placed during the hospital encounter of 07/23/13 (from the past 48 hour(s))  TSH     Status: Abnormal   Collection Time    07/24/13  7:21 PM      Result Value Range   TSH 12.183 (*) 0.350 - 4.500 uIU/mL   Comment: Performed at [facility]    Physical Findings: AIMS: Facial and Oral Movements Muscles of Facial Expression: None, normal Lips and Perioral Area: None, normal Jaw: None, normal Tongue: None, normal,Extremity Movements Upper (arms, wrists, hands, fingers): None, normal Lower (legs, knees, ankles, toes): None, normal, Trunk Movements Neck, shoulders, hips: None, normal, Overall Severity Severity of abnormal movements (highest score from questions above): None, normal Incapacitation due to abnormal movements: None, normal Patient's awareness of abnormal movements (rate only patient's report): No Awareness, Dental Status Current problems with teeth and/or dentures?: No Does patient usually wear dentures?: No  CIWA:    COWS:     Treatment Plan Summary: Daily contact with patient to assess and evaluate symptoms and progress in treatment Medication management  Plan:1. Continue crisis management and stabilization. 2. Medication management to reduce current symptoms to base line and improve patient's overall level of functioning 3. Treat health problems as indicated. 4. Develop treatment plan to decrease risk of relapse upon discharge and the need for     readmission. 5. Psycho-social education regarding relapse prevention and self care. 6. Health care follow up as needed for medical problems. 7. Continue home medications where appropriate.   Medical Decision Making Problem Points:  Established problem, stable/improving (1) Data Points:  Review or order medicine tests (1)  I certify that inpatient services furnished can reasonably be expected to improve the patient's  condition.   Rona Ravens. [place] RPAC 12:05 AM 07/26/2013  Reviewed the information documented and agree with the treatment plan.  Shoshanah Dapper,JANARDHAHA R. 07/26/2013 10:03 PM

## 2013-07-26 NOTE — Progress Notes (Signed)
D:  Per pt self inventory pt reports sleeping well, appetite good, energy level normal, ability to pay attention good, rates depression at 3 out of 10 and hopelessness at a 1 out of 10, denies SI/HI/AVH.    A:  Emotional support provided, Encouraged pt to continue with treatment plan and attend all group activities, q15 min checks maintained for safety.  R:  Pt is calm and cooperative, pleasant with staff and other patients, attending groups.

## 2013-07-26 NOTE — Progress Notes (Signed)
Recreation Therapy Notes  Date: 09.18.2014 Time: 2:45pm Location: [address] Dayroom  Group Topic: Animal Assisted Activities (AAA)  Behavioral Response: Did not attend. Per patient consent patient refused all AAA services during admission due to fear of dogs.   Marykay Lex Derica Leiber, LRT/CTRS  Jearl Klinefelter 07/26/2013 4:23 PM

## 2013-07-26 NOTE — Progress Notes (Signed)
Adult Psychoeducational Group Note  Date:  07/26/2013 Time:  6:23 PM  Group Topic/Focus:  Overcoming Stress:   The focus of this group is to define stress and help patients assess their triggers.  Participation Level:  Active  Participation Quality:  Appropriate and Sharing  Affect:  Appropriate  Cognitive:  Appropriate  Insight: Appropriate  Engagement in Group:  Engaged  Modes of Intervention:  Discussion, Education, Socialization and Support    Janice Roberts 07/26/2013, 6:23 PM

## 2013-07-26 NOTE — [facility] Group Notes (Signed)
[facility] Group Notes:  (Nursing/MHT/Case Management/Adjunct)  Date:  07/26/2013  Time:  9:39 AM  Type of Therapy:  Wellness/Rules Group  Participation Level:  Minimal  Participation Quality:  Appropriate and Attentive  Affect:  Appropriate  Cognitive:  Alert and Appropriate  Insight:  Appropriate  Engagement in Group:  Engaged and Supportive  Modes of Intervention:  Activity, Discussion, Orientation and Support  Summary of Progress/Problems: Pt was engaged and attentive throughout group.  Janice Roberts 07/26/2013, 9:39 AM

## 2013-07-26 NOTE — [facility] Group Notes (Signed)
[facility] LCSW Group Therapy  07/26/2013  1:15 PM   Type of Therapy:  Group Therapy  Participation Level:  Active  Participation Quality:  Appropriate and Attentive  Affect:  Appropriate, Calm  Cognitive:  Alert and Appropriate  Insight:  Developing/Improving and Engaged  Engagement in Therapy:  Developing/Improving and Engaged  Modes of Intervention:  Clarification, Confrontation, Discussion, Education, Exploration, Limit-setting, Orientation, Problem-solving, Rapport Building, Dance movement psychotherapist, Socialization and Support  Summary of Progress/Problems: The topic for group was balance in life.  Today's group focused on defining balance in one's own words, identifying things that can knock one off balance, and exploring healthy ways to maintain balance in life. Group members were asked to provide an example of a time when they felt off balance, describe how they handled that situation,and process healthier ways to regain balance in the future. Group members were asked to share the most important tool for maintaining balance that they learned while at [facility] and how they plan to apply this method after discharge.  Pt shared that aggravation throws her off balance.  Pt states that's he plans to stay active, socialize more and put herself first to regain balance in her life.  Pt also notes the medication she is on now seems to keep her more balanced.  Pt appears to have more insight today on what she needs to do to stay well.  Pt actively participated and was engaged in group discussion.    Reyes Ivan, LCSWA 07/26/2013 2:54 PM
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
## 2013-07-26 NOTE — Progress Notes (Signed)
Patient ID: Janice Roberts, female   DOB: [birth year], 37 y.o.   MRN: [id number] D: pt. Youngest daughter visited this evening, Pt. Smiling reports "very good day", "slept well on the new medicine and I didn't wake up drowsy or groggy." Pt. Reports discharge plans to follow up with a therapist at [facility], notes she will feel more comfortable talking 1:1, "instead of telling my business in front of everybody." A: Clinical research associate provided emotional support encouraged client to follow through with discharge plans of seeing a therapist. Staff encouraged group. Staff will monitor q62min for safety. R: Pt. Is safe on the unit. Pt. Attended karaoke.

## 2013-07-27 MED ORDER — SERTRALINE HCL 100 MG PO TABS: 100.0000 mg | ORAL_TABLET | Freq: Every day | ORAL | Status: DC

## 2013-07-27 MED ORDER — HYDROCHLOROTHIAZIDE 25 MG PO TABS: 25.0000 mg | ORAL_TABLET | Freq: Every day | ORAL | Status: DC

## 2013-07-27 MED ORDER — TRAZODONE HCL 50 MG PO TABS: ORAL_TABLET | ORAL | Status: DC

## 2013-07-27 MED ORDER — MEDROXYPROGESTERONE ACETATE 150 MG/ML IM SUSP: 150.0000 mg | INTRAMUSCULAR | Status: DC

## 2013-07-27 MED ORDER — FLUTICASONE-SALMETEROL 100-50 MCG/DOSE IN AEPB: 1.0000 | INHALATION_SPRAY | Freq: Every day | RESPIRATORY_TRACT | Status: DC | PRN

## 2013-07-27 MED ORDER — PRAZOSIN HCL 1 MG PO CAPS: 1.0000 mg | ORAL_CAPSULE | Freq: Every day | ORAL | Status: DC

## 2013-07-27 MED ORDER — BENAZEPRIL HCL 40 MG PO TABS: 40.0000 mg | ORAL_TABLET | Freq: Every day | ORAL | Status: DC

## 2013-07-27 MED ORDER — ARIPIPRAZOLE 5 MG PO TABS: 5.0000 mg | ORAL_TABLET | Freq: Every day | ORAL | Status: DC

## 2013-07-27 MED ORDER — LEVOTHYROXINE SODIUM 125 MCG PO TABS: 125.0000 ug | ORAL_TABLET | Freq: Every day | ORAL | Status: DC

## 2013-07-27 MED ORDER — TRAZODONE HCL 50 MG PO TABS
50.0000 mg | ORAL_TABLET | Freq: Every day | ORAL | Status: DC
  Filled 2013-07-27: qty 4

## 2013-07-27 NOTE — Tx Team (Signed)
Interdisciplinary Treatment Plan Update (Adult)  Date: 07/27/2013  Time Reviewed:  9:45 AM  Progress in Treatment: Attending groups: Yes Participating in groups:  Yes Taking medication as prescribed:  Yes Tolerating medication:  Yes Family/Significant othe contact made: Yes Patient understands diagnosis:  Yes Discussing patient identified problems/goals with staff:  Yes Medical problems stabilized or resolved:  Yes Denies suicidal/homicidal ideation: Yes Issues/concerns per patient self-inventory:  Yes Other:  New problem(s) identified: N/A  Discharge Plan or Barriers: Pt has follow up scheduled at [facility] for medication management and therapy.    Reason for Continuation of Hospitalization: Stable to d/c  Comments: N/A  Estimated length of stay: D/C today  For review of initial/current patient goals, please see plan of care.  Attendees: Patient:  Janice Roberts  07/27/2013 10:02 AM   Family:     Physician:  Dr. Javier Glazier 07/27/2013 9:59 AM   Nursing:   Nestor Ramp, RN 07/27/2013 9:59 AM   Clinical Social Worker:  Reyes Ivan, LCSWA 07/27/2013 9:59 AM   Other: Verne Spurr, PA 07/27/2013 9:59 AM   Other:  Frankey Shown, MA care coordination 07/27/2013 9:59 AM   Other:  Juline Patch, LCSW 07/27/2013 9:59 AM   Other:  Burnetta Sabin, RN 07/27/2013 10:00 AM   Other:    Other:    Other:    Other:    Other:    Other:     Scribe for Treatment Team:   Carmina Miller, 07/27/2013 9:59 AM

## 2013-07-27 NOTE — Progress Notes (Signed)
Pt discharged per MD orders; pt currently denies SI/HI and auditory/visual hallucinations; pt was given education by RN regarding follow-up appointments and medications and pt denied any questions or concerns about these instructions; pt was discharged to lobby (patient had no belongings in locker).

## 2013-07-27 NOTE — [facility] Group Notes (Signed)
[facility] LCSW Aftercare Discharge Planning Group Note   07/27/2013  8:45 AM  Participation Quality:  Alert and Appropriate   Mood/Affect:  Appropriate and Bright  Depression Rating:  0  Anxiety Rating:  0  Thoughts of Suicide:  Pt denies SI/HI  Will you contract for safety?   Yes  Current AVH:  Pt denies  Plan for Discharge/Comments:  Pt attended discharge planning group and actively participated in group.  CSW provided pt with today's workbook.  Pt reports feeling stable to d/c today.  Pt was observed smiling today during group and in interacting with peers on the unit.  Pt will return home to family in [place].  Pt has follow up scheduled at [facility] for medication management and therapy.  No further needs voiced by pt at this time.    Transportation Means: Pt reports access to transportation - daughter will pick pt up  Supports: Reports family/daughters are supportive  Reyes Ivan, LCSWA 07/27/2013 9:57 AM
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
## 2013-07-27 NOTE — [facility] Suicide Risk Assessment (Signed)
Suicide Risk Assessment  Discharge Assessment     Demographic Factors:  Adolescent or young adult and Low socioeconomic status  Mental Status Per Nursing Assessment::   On Admission:  Suicidal ideation indicated by patient  Current Mental Status by Physician: Mental Status Examination: Patient appeared as per his stated age, casually dressed, and fairly groomed, and maintaining good eye contact. Patient has good mood and his affect was constricted. He has normal rate, rhythm, and volume of speech. His thought process is linear and goal directed. Patient has denied suicidal, homicidal ideations, intentions or plans. Patient has no evidence of auditory or visual hallucinations, delusions, and paranoia. Patient has fair insight judgment and impulse control.  Loss Factors: Financial problems/change in socioeconomic status  Historical Factors: Prior suicide attempts and Impulsivity  Risk Reduction Factors:   Sense of responsibility to family, Religious beliefs about death, Living with another person, especially a relative, Positive social support, Positive therapeutic relationship and Positive coping skills or problem solving skills  Continued Clinical Symptoms:  Severe Anxiety and/or Agitation Depression:   Recent sense of peace/wellbeing Previous Psychiatric Diagnoses and Treatments Medical Diagnoses and Treatments/Surgeries  Cognitive Features That Contribute To Risk:  Polarized thinking    Suicide Risk:  Minimal: No identifiable suicidal ideation.  Patients presenting with no risk factors but with morbid ruminations; may be classified as minimal risk based on the severity of the depressive symptoms  Discharge Diagnoses:   AXIS I:  Generalized Anxiety Disorder and Major Depression, Recurrent severe AXIS II:  Deferred AXIS III:   Past Medical History  Diagnosis Date  . Asthma   . Obesity   . Hypertension   . Thyroid disease   . Anxiety   . Depression    AXIS IV:  other  psychosocial or environmental problems and problems related to social environment AXIS V:  61-70 mild symptoms  Plan Of Care/Follow-up recommendations:  Activity:  As tolerated Diet:  Regular  Is patient on multiple antipsychotic therapies at discharge:  No   Has Patient had three or more failed trials of antipsychotic monotherapy by history:  No  Recommended Plan for Multiple Antipsychotic Therapies: NA  Shereese Bonnie,JANARDHAHA R. 07/27/2013, 12:31 PM

## 2013-07-27 NOTE — Progress Notes (Signed)
Adult Psychoeducational Group Note  Date:  07/27/2013 Time:  1:30 PM  Group Topic/Focus:  Early Warning Signs:   The focus of this group is to help patients identify signs or symptoms they exhibit before slipping into an unhealthy state or crisis.  Participation Level:  Minimal  Participation Quality:  Attentive  Affect:  Appropriate  Cognitive:  Alert  Insight: Good  Engagement in Group:  Engaged  Modes of Intervention:  Discussion, Exploration, Socialization and Support  Additional Comments:  Pt came to group and shared that 2 of her early warning signs were shutting down and anger.  Janice Roberts 07/27/2013, 1:30 PM

## 2013-07-27 NOTE — Progress Notes (Signed)
[facility] Adult Case Management Discharge Plan :  Will you be returning to the same living situation after discharge: Yes,  returning home At discharge, do you have transportation home?:Yes,  daughters will pick pt up Do you have the ability to pay for your medications:Yes,  access to meds  Release of information consent forms completed and in the chart;  Patient's signature needed at discharge.  Patient to Follow up at: Follow-up Information   Follow up with [facility] On 07/31/2013. (Walk in on this date for hospital discharge appointment, for medication management.  Walk in clinic is Monday - Friday 8 am - 3 pm)    Contact information:   [address], [place] [address] Phone: [phone] Fax: [phone]      Follow up with [facility] On 08/01/2013. (Appointment scheduled at 10:00 am with Rudi Rummage for therapy)    Contact information:   [address] [place] [address] Phone: [phone] Fax: [phone]      Patient denies SI/HI:   Yes,  denies SI/HI    Safety Planning and Suicide Prevention discussed:  Yes,  discussed with pt and pt's daughter.  See suicide prevention education note.   Janice Roberts 07/27/2013, 10:07 AM

## 2013-07-31 NOTE — Progress Notes (Addendum)
Patient Discharge Instructions:  After Visit Summary (AVS):   Faxed to:  07/31/13 Discharge Summary Note:   Faxed to:  08/01/13 Psychiatric Admission Assessment Note:   Faxed to:  07/31/13 Suicide Risk Assessment - Discharge Assessment:   Faxed to:  07/31/13 Faxed/Sent to the Next Level Care provider:  07/31/13 Faxed to [facility] @ [phone] Faxed to [facility] @ [phone]  Jerelene Redden, 07/31/2013, 4:08 PM

## 2013-08-02 ENCOUNTER — Ambulatory Visit (INDEPENDENT_AMBULATORY_CARE_PROVIDER_SITE_OTHER): Payer: Medicaid Other | Admitting: *Deleted

## 2013-08-02 VITALS — Temp 97.8°F

## 2013-08-02 DIAGNOSIS — Z3049 Encounter for surveillance of other contraceptives: Secondary | ICD-10-CM

## 2013-08-02 DIAGNOSIS — Z3042 Encounter for surveillance of injectable contraceptive: Secondary | ICD-10-CM

## 2013-08-02 MED ORDER — MEDROXYPROGESTERONE ACETATE 150 MG/ML IM SUSP
150.0000 mg | Freq: Once | INTRAMUSCULAR | Status: AC
  Administered 2013-08-02: 150 mg via INTRAMUSCULAR

## 2013-08-16 ENCOUNTER — Encounter: Payer: Self-pay | Admitting: Physician Assistant

## 2013-08-16 ENCOUNTER — Ambulatory Visit (INDEPENDENT_AMBULATORY_CARE_PROVIDER_SITE_OTHER): Payer: Medicaid Other | Admitting: Physician Assistant

## 2013-08-16 VITALS — BP 122/82 | HR 72 | Temp 98.5°F | Resp 18 | Ht 63.25 in | Wt 215.0 lb

## 2013-08-16 DIAGNOSIS — F329 Major depressive disorder, single episode, unspecified: Secondary | ICD-10-CM

## 2013-08-16 DIAGNOSIS — F419 Anxiety disorder, unspecified: Secondary | ICD-10-CM

## 2013-08-16 DIAGNOSIS — I10 Essential (primary) hypertension: Secondary | ICD-10-CM

## 2013-08-16 DIAGNOSIS — J45909 Unspecified asthma, uncomplicated: Secondary | ICD-10-CM

## 2013-08-16 DIAGNOSIS — E669 Obesity, unspecified: Secondary | ICD-10-CM

## 2013-08-16 DIAGNOSIS — E079 Disorder of thyroid, unspecified: Secondary | ICD-10-CM

## 2013-08-16 DIAGNOSIS — F411 Generalized anxiety disorder: Secondary | ICD-10-CM
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
## 2013-09-17 ENCOUNTER — Other Ambulatory Visit: Payer: Self-pay | Admitting: Physician Assistant

## 2013-10-03 ENCOUNTER — Telehealth: Payer: Self-pay | Admitting: Physician Assistant

## 2013-10-03 DIAGNOSIS — I10 Essential (primary) hypertension: Secondary | ICD-10-CM

## 2013-10-03 NOTE — Telephone Encounter (Signed)
Needing BP medication and Thyroid medication have been out for 2 weeks. She states she can not see the Dr because she owes money? Pharmacy is [facility] on [address] back [phone]

## 2013-10-05 MED ORDER — LEVOTHYROXINE SODIUM 125 MCG PO TABS: 125.0000 ug | ORAL_TABLET | Freq: Every day | ORAL | Status: DC

## 2013-10-05 MED ORDER — BENAZEPRIL HCL 40 MG PO TABS: 40.0000 mg | ORAL_TABLET | Freq: Every day | ORAL | Status: DC

## 2013-10-05 MED ORDER — HYDROCHLOROTHIAZIDE 25 MG PO TABS: 25.0000 mg | ORAL_TABLET | Freq: Every day | ORAL | Status: DC

## 2013-10-05 NOTE — Telephone Encounter (Signed)
Spoke to patient.  Told her just need repeat lab work.  Do not come now if has been off meds x 2 weeks.  Call in one month refills, then come in 2 weeks and have repeat labs.

## 2013-10-18 ENCOUNTER — Other Ambulatory Visit: Payer: Self-pay

## 2013-10-29 ENCOUNTER — Other Ambulatory Visit: Payer: Self-pay | Admitting: Physician Assistant

## 2013-10-30 ENCOUNTER — Ambulatory Visit (INDEPENDENT_AMBULATORY_CARE_PROVIDER_SITE_OTHER): Payer: Medicaid Other | Admitting: Family Medicine

## 2013-10-30 DIAGNOSIS — E079 Disorder of thyroid, unspecified: Secondary | ICD-10-CM

## 2013-10-30 DIAGNOSIS — Z3042 Encounter for surveillance of injectable contraceptive: Secondary | ICD-10-CM

## 2013-10-30 DIAGNOSIS — Z3049 Encounter for surveillance of other contraceptives: Secondary | ICD-10-CM

## 2013-10-30 MED ORDER — MEDROXYPROGESTERONE ACETATE 150 MG/ML IM SUSP
150.0000 mg | Freq: Once | INTRAMUSCULAR | Status: AC
  Administered 2013-10-30: 150 mg via INTRAMUSCULAR

## 2013-10-30 NOTE — Telephone Encounter (Signed)
Medication refilled per protocol.Patient needs to be seen before any further refills 

## 2013-11-07 ENCOUNTER — Encounter: Payer: Self-pay | Admitting: Family Medicine

## 2013-12-12 ENCOUNTER — Telehealth: Payer: Self-pay | Admitting: Physician Assistant

## 2013-12-12 DIAGNOSIS — E039 Hypothyroidism, unspecified: Secondary | ICD-10-CM

## 2013-12-12 DIAGNOSIS — I10 Essential (primary) hypertension: Secondary | ICD-10-CM

## 2013-12-12 MED ORDER — LEVOTHYROXINE SODIUM 125 MCG PO TABS: 125.0000 ug | ORAL_TABLET | Freq: Every day | ORAL | Status: DC

## 2013-12-12 MED ORDER — HYDROCHLOROTHIAZIDE 25 MG PO TABS: 25.0000 mg | ORAL_TABLET | Freq: Every day | ORAL | Status: DC

## 2013-12-12 MED ORDER — BENAZEPRIL HCL 40 MG PO TABS: 40.0000 mg | ORAL_TABLET | Freq: Every day | ORAL | Status: DC

## 2013-12-12 NOTE — Telephone Encounter (Signed)
Call back number is [phone] PT is needing a refill on her ---pt wants to know does she need to come pick up these medications here at the office  hydrochlorothiazide (HYDRODIURIL) 25 MG tablet benazepril (LOTENSIN) 40 MG tablet levothyroxine (SYNTHROID, LEVOTHROID) 125 MCG tablet

## 2013-12-12 NOTE — Telephone Encounter (Signed)
Medication refilled per protocol. 

## 2014-01-22 ENCOUNTER — Emergency Department (HOSPITAL_COMMUNITY)
Admission: EM | Admit: 2014-01-22 | Discharge: 2014-01-22 | Discharge status: Absent without leave | Payer: Medicaid Other | Source: Home / Self Care

## 2014-03-27 NOTE — Patient Instructions (Addendum)
   Your procedure is scheduled on: Thursday, June 4    Enter through the [employer] [facility] at:  10:15 AM Pick up the phone at the desk and dial [phone] and inform us of your arrival.  Please call this number if you have any problems the morning of surgery: [phone]  Remember: Do not eat or drink after midnight: Wednesday Take these medicines the morning of surgery with a SIP OF WATER: Benazepril, Hydrochlorothiazide, Levothyroxine *Bring inhaler the day of surgery  Do not wear jewelry, make-up, or FINGER nail polish No metal in your hair or on your body. Do not wear lotions, powders, perfumes.  You may wear deodorant.  Do not bring valuables to the hospital. Contacts, dentures or bridgework may not be worn into surgery.  Patients discharged on the day of surgery will not be allowed to drive home.

## 2014-03-28 ENCOUNTER — Encounter (HOSPITAL_COMMUNITY)
Admission: RE | Admit: 2014-03-28 | Discharge: 2014-03-28 | Disposition: A | Discharge status: Home / Self Care | Payer: Medicaid Other | Source: Ambulatory Visit | Attending: Obstetrics & Gynecology | Admitting: Obstetrics & Gynecology

## 2014-03-28 ENCOUNTER — Encounter (HOSPITAL_COMMUNITY): Payer: Self-pay | Admitting: Pharmacy Technician

## 2014-03-28 ENCOUNTER — Encounter (HOSPITAL_COMMUNITY): Payer: Self-pay

## 2014-03-28 DIAGNOSIS — Z01812 Encounter for preprocedural laboratory examination: Secondary | ICD-10-CM | POA: Insufficient documentation

## 2014-03-28 LAB — BASIC METABOLIC PANEL
BUN: 19 mg/dL (ref 6–23)
CO2: 26 mEq/L (ref 19–32)
CREATININE: 1.09 mg/dL (ref 0.50–1.10)
Calcium: 10.1 mg/dL (ref 8.4–10.5)
Chloride: 102 mEq/L (ref 96–112)
GFR calc Af Amer: 73 mL/min — ABNORMAL LOW (ref >90)
GFR, EST NON AFRICAN AMERICAN: 63 mL/min — AB (ref >90)
GLUCOSE: 105 mg/dL — AB (ref 70–99)
Potassium: 4.1 mEq/L (ref 3.7–5.3)
Sodium: 140 mEq/L (ref 137–147)

## 2014-03-28 LAB — CBC
HCT: 37.4 % (ref 36.0–46.0)
HEMOGLOBIN: 12.7 g/dL (ref 12.0–15.0)
MCH: 29.3 pg (ref 26.0–34.0)
MCHC: 34 g/dL (ref 30.0–36.0)
MCV: 86.4 fL (ref 78.0–100.0)
Platelets: 252 10*3/uL (ref 150–400)
RBC: 4.33 MIL/uL (ref 3.87–5.11)
RDW: 14.8 % (ref 11.5–15.5)
WBC: 7.1 10*3/uL (ref 4.0–10.5)

## 2014-04-11 ENCOUNTER — Encounter (HOSPITAL_COMMUNITY): Admission: RE | Payer: Self-pay | Source: Ambulatory Visit

## 2014-04-11 ENCOUNTER — Ambulatory Visit (HOSPITAL_COMMUNITY)
Admission: RE | Admit: 2014-04-11 | Payer: Medicaid Other | Source: Ambulatory Visit | Admitting: Obstetrics & Gynecology

## 2014-04-11 SURGERY — LIGATION, FALLOPIAN TUBE, LAPAROSCOPIC
Anesthesia: Choice | Laterality: Bilateral

## 2014-05-08 ENCOUNTER — Telehealth: Payer: Self-pay | Admitting: Family Medicine

## 2014-05-08 ENCOUNTER — Encounter: Payer: Self-pay | Admitting: Family Medicine

## 2014-05-08 NOTE — Telephone Encounter (Signed)
[name], PA-C [name], LPN            This patient came up in my overdue results section for a TSH.  When I further evaluated-- it appears that her last office visit was October 2014 and she is on blood pressure and thyroid medication. Please call her and tell her to schedule office visit as she is due for office visit and lab work.  Janice Roberts 1976/02/17      Letter to patient to call and make 6 month follow up appointment

## 2015-01-01 ENCOUNTER — Emergency Department (HOSPITAL_COMMUNITY)
Admission: EM | Admit: 2015-01-01 | Discharge: 2015-01-01 | Disposition: A | Discharge status: Home / Self Care | Payer: Medicaid Other | Attending: Emergency Medicine | Admitting: Emergency Medicine

## 2015-01-01 ENCOUNTER — Encounter (HOSPITAL_COMMUNITY): Payer: Self-pay | Admitting: *Deleted

## 2015-01-01 DIAGNOSIS — Z7951 Long term (current) use of inhaled steroids: Secondary | ICD-10-CM | POA: Insufficient documentation

## 2015-01-01 DIAGNOSIS — E079 Disorder of thyroid, unspecified: Secondary | ICD-10-CM | POA: Insufficient documentation

## 2015-01-01 DIAGNOSIS — N390 Urinary tract infection, site not specified: Secondary | ICD-10-CM | POA: Insufficient documentation

## 2015-01-01 DIAGNOSIS — I10 Essential (primary) hypertension: Secondary | ICD-10-CM | POA: Insufficient documentation

## 2015-01-01 DIAGNOSIS — Z3202 Encounter for pregnancy test, result negative: Secondary | ICD-10-CM | POA: Insufficient documentation

## 2015-01-01 DIAGNOSIS — E669 Obesity, unspecified: Secondary | ICD-10-CM | POA: Insufficient documentation

## 2015-01-01 DIAGNOSIS — Z79899 Other long term (current) drug therapy: Secondary | ICD-10-CM | POA: Insufficient documentation

## 2015-01-01 DIAGNOSIS — Z880 Allergy status to penicillin: Secondary | ICD-10-CM | POA: Insufficient documentation

## 2015-01-01 DIAGNOSIS — J45909 Unspecified asthma, uncomplicated: Secondary | ICD-10-CM | POA: Insufficient documentation

## 2015-01-01 DIAGNOSIS — N3090 Cystitis, unspecified without hematuria: Secondary | ICD-10-CM

## 2015-01-01 DIAGNOSIS — F329 Major depressive disorder, single episode, unspecified: Secondary | ICD-10-CM | POA: Insufficient documentation

## 2015-01-01 DIAGNOSIS — F419 Anxiety disorder, unspecified: Secondary | ICD-10-CM | POA: Insufficient documentation

## 2015-01-01 LAB — URINALYSIS, ROUTINE W REFLEX MICROSCOPIC
Glucose, UA: NEGATIVE mg/dL
Ketones, ur: 40 mg/dL — AB
NITRITE: POSITIVE — AB
Protein, ur: >300 mg/dL — AB
Specific Gravity, Urine: 1.024 (ref 1.005–1.030)
UROBILINOGEN UA: 1 mg/dL (ref 0.0–1.0)
pH: 5 (ref 5.0–8.0)

## 2015-01-01 LAB — URINE MICROSCOPIC-ADD ON

## 2015-01-01 LAB — WET PREP, GENITAL
CLUE CELLS WET PREP: NONE SEEN
Trich, Wet Prep: NONE SEEN
Yeast Wet Prep HPF POC: NONE SEEN

## 2015-01-01 LAB — GC/CHLAMYDIA PROBE AMP (~~LOC~~) NOT AT ARMC
Chlamydia: NEGATIVE
NEISSERIA GONORRHEA: NEGATIVE

## 2015-01-01 LAB — PREGNANCY, URINE: PREG TEST UR: NEGATIVE

## 2015-01-01 MED ORDER — TRAMADOL HCL 50 MG PO TABS: 50.0000 mg | ORAL_TABLET | Freq: Four times a day (QID) | ORAL | Status: DC | PRN

## 2015-01-01 MED ORDER — NITROFURANTOIN MONOHYD MACRO 100 MG PO CAPS
100.0000 mg | ORAL_CAPSULE | Freq: Once | ORAL | Status: AC
  Administered 2015-01-01: 100 mg via ORAL
  Filled 2015-01-01: qty 1

## 2015-01-01 MED ORDER — NITROFURANTOIN MONOHYD MACRO 100 MG PO CAPS: 100.0000 mg | ORAL_CAPSULE | Freq: Two times a day (BID) | ORAL | Status: DC

## 2015-01-01 MED ORDER — PHENAZOPYRIDINE HCL 200 MG PO TABS: 200.0000 mg | ORAL_TABLET | Freq: Three times a day (TID) | ORAL | Status: DC

## 2015-01-01 NOTE — Discharge Instructions (Signed)
We saw you in the ER for the blood in the urine. Labs indicate urinary tract infection, and we suspect that you have bladder infection causing you to havep ain. Take the meds prescribed, return to the ER if symptoms get worse.  Urinary Tract Infection Urinary tract infections (UTIs) can develop anywhere along your urinary tract. Your urinary tract is your body's drainage system for removing wastes and extra water. Your urinary tract includes two kidneys, two ureters, a bladder, and a urethra. Your kidneys are a pair of bean-shaped organs. Each kidney is about the size of your fist. They are located below your ribs, one on each side of your spine. CAUSES Infections are caused by microbes, which are microscopic organisms, including fungi, viruses, and bacteria. These organisms are so small that they can only be seen through a microscope. Bacteria are the microbes that most commonly cause UTIs. SYMPTOMS  Symptoms of UTIs may vary by age and gender of the patient and by the location of the infection. Symptoms in young women typically include a frequent and intense urge to urinate and a painful, burning feeling in the bladder or urethra during urination. Older women and men are more likely to be tired, shaky, and weak and have muscle aches and abdominal pain. A fever may mean the infection is in your kidneys. Other symptoms of a kidney infection include pain in your back or sides below the ribs, nausea, and vomiting. DIAGNOSIS To diagnose a UTI, your caregiver will ask you about your symptoms. Your caregiver also will ask to provide a urine sample. The urine sample will be tested for bacteria and white blood cells. White blood cells are made by your body to help fight infection. TREATMENT  Typically, UTIs can be treated with medication. Because most UTIs are caused by a bacterial infection, they usually can be treated with the use of antibiotics. The choice of antibiotic and length of treatment depend on  your symptoms and the type of bacteria causing your infection. HOME CARE INSTRUCTIONS  If you were prescribed antibiotics, take them exactly as your caregiver instructs you. Finish the medication even if you feel better after you have only taken some of the medication.  Drink enough water and fluids to keep your urine clear or pale yellow.  Avoid caffeine, tea, and carbonated beverages. They tend to irritate your bladder.  Empty your bladder often. Avoid holding urine for long periods of time.  Empty your bladder before and after sexual intercourse.  After a bowel movement, women should cleanse from front to back. Use each tissue only once. SEEK MEDICAL CARE IF:   You have back pain.  You develop a fever.  Your symptoms do not begin to resolve within 3 days. SEEK IMMEDIATE MEDICAL CARE IF:   You have severe back pain or lower abdominal pain.  You develop chills.  You have nausea or vomiting.  You have continued burning or discomfort with urination. MAKE SURE YOU:   Understand these instructions.  Will watch your condition.  Will get help right away if you are not doing well or get worse. Document Released: 08/04/2005 Document Revised: 04/25/2012 Document Reviewed: 12/03/2011 [facility] Patient Information 2015 [place], [place]. This information is not intended to replace advice given to you by your health care provider. Make sure you discuss any questions you have with your health care provider.

## 2015-01-01 NOTE — ED Notes (Signed)
Patient states she has been urinating blood and has urinary freq.
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
## 2015-01-01 NOTE — ED Notes (Signed)
Pt. Left with all belongings and refused wheelchair 

## 2015-02-28 ENCOUNTER — Other Ambulatory Visit: Payer: Self-pay | Admitting: Physician Assistant

## 2015-03-03 ENCOUNTER — Encounter: Payer: Self-pay | Admitting: Family Medicine

## 2015-03-03 ENCOUNTER — Telehealth: Payer: Self-pay | Admitting: Family Medicine

## 2015-03-03 DIAGNOSIS — I10 Essential (primary) hypertension: Secondary | ICD-10-CM

## 2015-03-03 NOTE — Telephone Encounter (Signed)
Pt needs to be seen.  Has not been seen since 2014.

## 2015-03-03 NOTE — Telephone Encounter (Signed)
Pt has not been seen in over a year, nor have med refills been requested.  Refills denied.  Letter to pt needs to be seen

## 2015-03-07 ENCOUNTER — Telehealth: Payer: Self-pay | Admitting: General Practice

## 2015-03-07 ENCOUNTER — Other Ambulatory Visit: Payer: Self-pay | Admitting: Physician Assistant

## 2015-03-07 NOTE — Telephone Encounter (Signed)
Medication refill for one time only.  Patient needs to be seen.  Appt made

## 2015-03-19 ENCOUNTER — Emergency Department (HOSPITAL_COMMUNITY)
Admission: EM | Admit: 2015-03-19 | Discharge: 2015-03-19 | Disposition: A | Discharge status: Home / Self Care | Payer: Medicaid Other | Attending: Emergency Medicine | Admitting: Emergency Medicine

## 2015-03-19 ENCOUNTER — Emergency Department (HOSPITAL_COMMUNITY): Payer: Medicaid Other

## 2015-03-19 ENCOUNTER — Encounter (HOSPITAL_COMMUNITY): Payer: Self-pay | Admitting: Emergency Medicine

## 2015-03-19 DIAGNOSIS — E079 Disorder of thyroid, unspecified: Secondary | ICD-10-CM | POA: Insufficient documentation

## 2015-03-19 DIAGNOSIS — J45909 Unspecified asthma, uncomplicated: Secondary | ICD-10-CM | POA: Insufficient documentation

## 2015-03-19 DIAGNOSIS — Z880 Allergy status to penicillin: Secondary | ICD-10-CM | POA: Insufficient documentation

## 2015-03-19 DIAGNOSIS — E669 Obesity, unspecified: Secondary | ICD-10-CM | POA: Insufficient documentation

## 2015-03-19 DIAGNOSIS — M545 Low back pain: Secondary | ICD-10-CM

## 2015-03-19 DIAGNOSIS — I10 Essential (primary) hypertension: Secondary | ICD-10-CM | POA: Insufficient documentation

## 2015-03-19 DIAGNOSIS — F329 Major depressive disorder, single episode, unspecified: Secondary | ICD-10-CM | POA: Insufficient documentation

## 2015-03-19 DIAGNOSIS — Z79899 Other long term (current) drug therapy: Secondary | ICD-10-CM | POA: Insufficient documentation

## 2015-03-19 DIAGNOSIS — F419 Anxiety disorder, unspecified: Secondary | ICD-10-CM | POA: Insufficient documentation

## 2015-03-19 DIAGNOSIS — M5440 Lumbago with sciatica, unspecified side: Secondary | ICD-10-CM | POA: Insufficient documentation

## 2015-03-19 MED ORDER — HYDROCODONE-ACETAMINOPHEN 5-325 MG PO TABS: 1.0000 | ORAL_TABLET | Freq: Four times a day (QID) | ORAL | Status: DC | PRN

## 2015-03-19 NOTE — Discharge Instructions (Signed)
Return to the emergency room with worsening of symptoms, new symptoms or with symptoms that are concerning , especially fevers, loss of control of bladder or bowels, numbness or tingling around genital region or anus, weakness. RICE: Rest, Ice (three cycles of 20 mins on, 71mns off at least twice a day), compression/brace, elevation. Heating pad works well for back pain. norco for severe pain. Do not operate machinery, drive or drink alcohol while taking narcotics or muscle relaxers. Call to make follow up appointment with orthopedics. Read below information and follow recommendations.  Back Exercises Back exercises help treat and prevent back injuries. The goal of back exercises is to increase the strength of your abdominal and back muscles and the flexibility of your back. These exercises should be started when you no longer have back pain. Back exercises include:  Pelvic Tilt. Lie on your back with your knees bent. Tilt your pelvis until the lower part of your back is against the floor. Hold this position 5 to 10 sec and repeat 5 to 10 times.  Knee to Chest. Pull first 1 knee up against your chest and hold for 20 to 30 seconds, repeat this with the other knee, and then both knees. This may be done with the other leg straight or bent, whichever feels better.  Sit-Ups or Curl-Ups. Bend your knees 90 degrees. Start with tilting your pelvis, and do a partial, slow sit-up, lifting your trunk only 30 to 45 degrees off the floor. Take at least 2 to 3 seconds for each sit-up. Do not do sit-ups with your knees out straight. If partial sit-ups are difficult, simply do the above but with only tightening your abdominal muscles and holding it as directed.  Hip-Lift. Lie on your back with your knees flexed 90 degrees. Push down with your feet and shoulders as you raise your hips a couple inches off the floor; hold for 10 seconds, repeat 5 to 10 times.  Back arches. Lie on your stomach, propping yourself up on  bent elbows. Slowly press on your hands, causing an arch in your low back. Repeat 3 to 5 times. Any initial stiffness and discomfort should lessen with repetition over time.  Shoulder-Lifts. Lie face down with arms beside your body. Keep hips and torso pressed to floor as you slowly lift your head and shoulders off the floor. Do not overdo your exercises, especially in the beginning. Exercises may cause you some mild back discomfort which lasts for a few minutes; however, if the pain is more severe, or lasts for more than 15 minutes, do not continue exercises until you see your caregiver. Improvement with exercise therapy for back problems is slow.  See your caregivers for assistance with developing a proper back exercise program. Document Released: 12/02/2004 Document Revised: 01/17/2012 Document Reviewed: 08/26/2011 [facility] Information 2015 [place] This information is not intended to replace advice given to you by your health care provider. Make sure you discuss any questions you have with your health care provider.   Back Injury Prevention Back injuries can be extremely painful and difficult to heal. After having one back injury, you are much more likely to experience another later on. It is important to learn how to avoid injuring or re-injuring your back. The following tips can help you to prevent a back injury. PHYSICAL FITNESS  Exercise regularly and try to develop good tone in your abdominal muscles. Your abdominal muscles provide a lot of the support needed by your back.  Do aerobic exercises (  walking, jogging, biking, swimming) regularly.  Do exercises that increase balance and strength (tai chi, yoga) regularly. This can decrease your risk of falling and injuring your back.  Stretch before and after exercising.  Maintain a healthy weight. The more you weigh, the more stress is placed on your back. For every pound of weight, 10 times that amount of pressure is placed  on the back. DIET  Talk to your caregiver about how much calcium and vitamin D you need per day. These nutrients help to prevent weakening of the bones (osteoporosis). Osteoporosis can cause broken (fractured) bones that lead to back pain.  Include good sources of calcium in your diet, such as dairy products, green, leafy vegetables, and products with calcium added (fortified).  Include good sources of vitamin D in your diet, such as milk and foods that are fortified with vitamin D.  Consider taking a nutritional supplement or a multivitamin if needed.  Stop smoking if you smoke. POSTURE  Sit and stand up straight. Avoid leaning forward when you sit or hunching over when you stand.  Choose chairs with good low back (lumbar) support.  If you work at a desk, sit close to your work so you do not need to lean over. Keep your chin tucked in. Keep your neck drawn back and elbows bent at a right angle. Your arms should look like the letter "L."  Sit high and close to the steering wheel when you drive. Add a lumbar support to your car seat if needed.  Avoid sitting or standing in one position for too long. Take breaks to get up, stretch, and walk around at least once every hour. Take breaks if you are driving for long periods of time.  Sleep on your side with your knees slightly bent, or sleep on your back with a pillow under your knees. Do not sleep on your stomach. LIFTING, TWISTING, AND REACHING  Avoid heavy lifting, especially repetitive lifting. If you must do heavy lifting:  Stretch before lifting.  Work slowly.  Rest between lifts.  Use carts and dollies to move objects when possible.  Make several small trips instead of carrying 1 heavy load.  Ask for help when you need it.  Ask for help when moving big, awkward objects.  Follow these steps when lifting:  Stand with your feet shoulder-width apart.  Get as close to the object as you can. Do not try to pick up heavy  objects that are far from your body.  Use handles or lifting straps if they are available.  Bend at your knees. Squat down, but keep your heels off the floor.  Keep your shoulders pulled back, your chin tucked in, and your back straight.  Lift the object slowly, tightening the muscles in your legs, abdomen, and buttocks. Keep the object as close to the center of your body as possible.  When you put a load down, use these same guidelines in reverse.  Do not:  Lift the object above your waist.  Twist at the waist while lifting or carrying a load. Move your feet if you need to turn, not your waist.  Bend over without bending at your knees.  Avoid reaching over your head, across a table, or for an object on a high surface. OTHER TIPS  Avoid wet floors and keep sidewalks clear of ice to prevent falls.  Do not sleep on a mattress that is too soft or too hard.  Keep items that are used frequently within  easy reach.  Put heavier objects on shelves at waist level and lighter objects on lower or higher shelves.  Find ways to decrease your stress, such as exercise, massage, or relaxation techniques. Stress can build up in your muscles. Tense muscles are more vulnerable to injury.  Seek treatment for depression or anxiety if needed. These conditions can increase your risk of developing back pain. SEEK MEDICAL CARE IF:  You injure your back.  You have questions about diet, exercise, or other ways to prevent back injuries. MAKE SURE YOU:  Understand these instructions.  Will watch your condition.  Will get help right away if you are not doing well or get worse. Document Released: 12/02/2004 Document Revised: 01/17/2012 Document Reviewed: 12/06/2011 [facility] Patient Information 2015 [place], [place]. This information is not intended to replace advice given to you by your health care provider. Make sure you discuss any questions you have with your health care provider.

## 2015-03-19 NOTE — ED Provider Notes (Signed)
CSN: [id number]     Arrival date & time 03/19/15  5784 History   First MD Initiated Contact with Patient 03/19/15 [phone]     Chief Complaint  Patient presents with  . Back Pain     (Consider location/radiation/quality/duration/timing/severity/associated sxs/prior Treatment) HPI  Janice Roberts is a 39 y.o. female with PMH of obesity, HTN presenting with 2 weeks of low back pain described as sharp, worse with ambulation and movement. Unchanged. Pt denies injury. No improvement with bayer. At times pain radiates into left leg. No abdominal pain or urinary symptoms. No fevers, chills, night sweats, weight loss, IVDU, history of malignancy. No loss of control of bladder or bowel. No numbness/tingling, weakness or saddle anesthesia.     Past Medical History  Diagnosis Date  . Asthma   . Obesity   . Hypertension   . Thyroid disease   . Anxiety   . Depression    Past Surgical History  Procedure Laterality Date  . Thyroidectomy    . Cesarean section    . Appendectomy    . Dilation and curettage of uterus  08/08/2008 & 03/08/2013   Family History  Problem Relation Age of Onset  . Cancer Mother 35    Breast  . Asthma Sister   . Asthma Brother   . Asthma Sister   . Birth defects Sister    History  Substance Use Topics  . Smoking status: Never Smoker   . Smokeless tobacco: Never Used  . Alcohol Use: No   OB History    No data available     Review of Systems  Constitutional: Negative for fever and chills.  Gastrointestinal: Negative for nausea, vomiting and abdominal pain.  Genitourinary: Negative for dysuria and urgency.  Musculoskeletal: Positive for back pain. Negative for gait problem.  Neurological: Negative for weakness and numbness.      Allergies  Ivp dye and Penicillins  Home Medications   Prior to Admission medications   Medication Sig Start Date End Date Taking? Authorizing Provider  benazepril (LOTENSIN) 40 MG tablet TAKE ONE TABLET BY MOUTH ONCE DAILY  FOR  HYPERTENSION 03/07/15  Yes [name], PA-C  hydrochlorothiazide (HYDRODIURIL) 25 MG tablet TAKE ONE TABLET BY MOUTH ONCE DAILY FOR  EDEMA 03/07/15  Yes [name], PA-C  levothyroxine (SYNTHROID, LEVOTHROID) 125 MCG tablet TAKE ONE TABLET BY MOUTH ONCE DAILY BEFORE  BREAKFAST  FOR  HYPOTHYROIDISM 03/07/15  Yes [name], PA-C  Fluticasone-Salmeterol (ADVAIR) 100-50 MCG/DOSE AEPB Inhale 1 puff into the lungs daily as needed. For asthma Patient not taking: Reported on 03/19/2015 07/27/13   Ruben Im, PA-C  HYDROcodone-acetaminophen (NORCO/VICODIN) 5-325 MG per tablet Take 1-2 tablets by mouth every 6 (six) hours as needed. 03/19/15   Al Corpus, PA-C  medroxyPROGESTERone (DEPO-PROVERA) 150 MG/ML injection Inject 1 mL (150 mg total) into the muscle every 3 (three) months. For contraception. Patient not taking: Reported on 01/01/2015 07/27/13   Ruben Im, PA-C  nitrofurantoin, macrocrystal-monohydrate, (MACROBID) 100 MG capsule Take 1 capsule (100 mg total) by mouth 2 (two) times daily. Patient not taking: Reported on 03/19/2015 01/01/15   Varney Biles, MD  phenazopyridine (PYRIDIUM) 200 MG tablet Take 1 tablet (200 mg total) by mouth 3 (three) times daily. Patient not taking: Reported on 03/19/2015 01/01/15   Varney Biles, MD  traMADol (ULTRAM) 50 MG tablet Take 1 tablet (50 mg total) by mouth every 6 (six) hours as needed. Patient not taking: Reported on 03/19/2015 01/01/15  Varney Biles, MD   BP 117/80 mmHg  Pulse 94  Temp(Src) 98.7 F (37.1 C) (Oral)  Resp 14  Ht 5\' 2"  (1.575 m)  Wt 243 lb (110.224 kg)  BMI 44.43 kg/m2  SpO2 97%  LMP 03/01/2015 Physical Exam  Constitutional: She appears well-developed and well-nourished. No distress.  HENT:  Head: Normocephalic and atraumatic.  Eyes: Conjunctivae are normal. Right eye exhibits no discharge. Left eye exhibits no discharge.  Cardiovascular: Normal rate, regular rhythm and normal heart sounds.   Pulmonary/Chest:  Effort normal and breath sounds normal. No respiratory distress. She has no wheezes.  Abdominal: Soft. Bowel sounds are normal. She exhibits no distension. There is no tenderness.  Musculoskeletal:  No midline back tenderness, step off or crepitus. Right and Left sided lower back tenderness. No CVA tenderness.  Neurological: She is alert. Coordination normal.  Equal muscle tone. 5/5 strength in lower extremities. DTR equal and intact. Negative straight leg test. Normal gait.  Skin: Skin is warm and dry. She is not diaphoretic.  Nursing note and vitals reviewed.   ED Course  Procedures (including critical care time) Labs Review Labs Reviewed - No data to display  Imaging Review Dg Lumbar Spine Complete  03/19/2015   CLINICAL DATA:  Low back pain for 2 weeks.  No recent injury.  EXAM: LUMBAR SPINE - COMPLETE 4+ VIEW  COMPARISON:  None.  FINDINGS: Six lumbar type vertebral bodies. There is no evidence of lumbar spine fracture. Alignment is normal. Intervertebral disc spaces are maintained.  IMPRESSION: Negative.   Electronically Signed   By: Lucienne Capers M.D.   On: 03/19/2015 06:53     EKG Interpretation None      MDM   Final diagnoses:  Bilateral low back pain, with sciatica presence unspecified   Patient with back pain. No loss of bowel or bladder control. No saddle anesthesia. No fever, night sweats, weight loss, h/o cancer, IVDU. VSS. No neurological deficits and normal neuro exam. Patient ambulatory. Due to persistent nature of pain, Xray obtained and negative. No concern for cauda equina.  RICE protocol and pain medicine indicated and discussed with patient. Driving and sedation precautions provided. Patient is afebrile, nontoxic, and in no acute distress. Patient is appropriate for outpatient management and is stable for discharge.  Discussed return precautions with patient. Discussed all results and patient verbalizes understanding and agrees with plan.    Al Corpus, PA-C 03/19/15 9024  Everlene Balls, MD 03/19/15 [phone]

## 2015-03-19 NOTE — ED Notes (Signed)
Pt. reports low back pain onset 2 weeks ago , denies injury , no hematuria or dysuria.

## 2015-03-24 ENCOUNTER — Ambulatory Visit: Payer: Self-pay | Admitting: Physician Assistant

## 2015-07-09 ENCOUNTER — Other Ambulatory Visit: Payer: Self-pay | Admitting: Physician Assistant

## 2015-07-10 ENCOUNTER — Other Ambulatory Visit: Payer: Self-pay | Admitting: Family Medicine

## 2015-07-10 NOTE — Telephone Encounter (Signed)
Pt has not been seen since 2014.  Med refills denied. Letter to patient that she must be seen

## 2015-07-10 NOTE — Telephone Encounter (Signed)
Refill denied.   Patient needs appointment.   Letter sent.

## 2015-09-15 ENCOUNTER — Encounter: Payer: Medicaid Other | Admitting: Physician Assistant

## 2015-09-29 ENCOUNTER — Encounter (HOSPITAL_COMMUNITY): Payer: Self-pay | Admitting: *Deleted

## 2015-09-29 ENCOUNTER — Inpatient Hospital Stay (HOSPITAL_COMMUNITY)
Admission: AD | Admit: 2015-09-29 | Discharge: 2015-09-29 | Disposition: A | Discharge status: Home / Self Care | Payer: Medicare Other | Source: Ambulatory Visit | Attending: Family Medicine | Admitting: Family Medicine

## 2015-09-29 DIAGNOSIS — Z880 Allergy status to penicillin: Secondary | ICD-10-CM | POA: Diagnosis not present

## 2015-09-29 DIAGNOSIS — G8929 Other chronic pain: Secondary | ICD-10-CM | POA: Diagnosis not present

## 2015-09-29 DIAGNOSIS — I10 Essential (primary) hypertension: Secondary | ICD-10-CM | POA: Diagnosis not present

## 2015-09-29 DIAGNOSIS — R109 Unspecified abdominal pain: Secondary | ICD-10-CM | POA: Diagnosis present

## 2015-09-29 DIAGNOSIS — R102 Pelvic and perineal pain: Secondary | ICD-10-CM | POA: Insufficient documentation

## 2015-09-29 DIAGNOSIS — Z87440 Personal history of urinary (tract) infections: Secondary | ICD-10-CM | POA: Insufficient documentation

## 2015-09-29 LAB — CBC
HCT: 37.9 % (ref 36.0–46.0)
Hemoglobin: 12.6 g/dL (ref 12.0–15.0)
MCH: 29 pg (ref 26.0–34.0)
MCHC: 33.2 g/dL (ref 30.0–36.0)
MCV: 87.3 fL (ref 78.0–100.0)
PLATELETS: 220 10*3/uL (ref 150–400)
RBC: 4.34 MIL/uL (ref 3.87–5.11)
RDW: 15.7 % — AB (ref 11.5–15.5)
WBC: 5.9 10*3/uL (ref 4.0–10.5)

## 2015-09-29 LAB — URINALYSIS, ROUTINE W REFLEX MICROSCOPIC
BILIRUBIN URINE: NEGATIVE
Glucose, UA: NEGATIVE mg/dL
Hgb urine dipstick: NEGATIVE
Ketones, ur: NEGATIVE mg/dL
Leukocytes, UA: NEGATIVE
NITRITE: NEGATIVE
PH: 6 (ref 5.0–8.0)
Protein, ur: NEGATIVE mg/dL
SPECIFIC GRAVITY, URINE: 1.02 (ref 1.005–1.030)

## 2015-09-29 LAB — POCT PREGNANCY, URINE: Preg Test, Ur: NEGATIVE

## 2015-09-29 LAB — WET PREP, GENITAL
Sperm: NONE SEEN
Trich, Wet Prep: NONE SEEN
WBC, Wet Prep HPF POC: NONE SEEN
Yeast Wet Prep HPF POC: NONE SEEN

## 2015-09-29 MED ORDER — IBUPROFEN 600 MG PO TABS: 600.0000 mg | ORAL_TABLET | Freq: Four times a day (QID) | ORAL | Status: DC | PRN

## 2015-09-29 NOTE — MAU Provider Note (Signed)
Chief Complaint: Abdominal Pain   First Provider Initiated Contact with Patient 09/29/15 1551      SUBJECTIVE HPI: Janice Roberts is a 39 y.o. [id number] who presents to maternity admissions reporting episodes of pain in her pelvis and left and right lower abdomen that are intermittent and occur often after intercourse.  She also reports some painful intercourse x 2-3 months.  This week, she had spotting after intercourse that was not associated with her period but she has not had this symptom before. She has not tried any treatments for the pain or bleeding.  She was treated for UTI 2 months ago from the [facility] ED and took all her medication as prescribed.  She reports a TAB in September and no menses in October but regular menses before September and a normal period this month.  She reports she and partner are monogamous and denies STI risks. She denies vaginal itching/burning, urinary symptoms, h/a, dizziness, n/v, or fever/chills.     HPI  Past Medical History  Diagnosis Date  . Asthma   . Obesity   . Hypertension   . Thyroid disease   . Anxiety   . Depression    Past Surgical History  Procedure Laterality Date  . Thyroidectomy    . Cesarean section    . Appendectomy    . Dilation and curettage of uterus  08/08/2008 & 03/08/2013   Social History   Social History  . Marital Status: Single    Spouse Name: N/A  . Number of Children: N/A  . Years of Education: N/A   Occupational History  . Not on file.   Social History Main Topics  . Smoking status: Never Smoker   . Smokeless tobacco: Never Used  . Alcohol Use: No  . Drug Use: No  . Sexual Activity: Yes    Birth Control/ Protection: None   Other Topics Concern  . Not on file   Social History Narrative   No current facility-administered medications on file prior to encounter.   Current Outpatient Prescriptions on File Prior to Encounter  Medication Sig Dispense Refill  . benazepril (LOTENSIN) 40 MG tablet TAKE ONE  TABLET BY MOUTH ONCE DAILY FOR  HYPERTENSION (Patient not taking: Reported on 09/29/2015) 30 tablet 0  . Fluticasone-Salmeterol (ADVAIR) 100-50 MCG/DOSE AEPB Inhale 1 puff into the lungs daily as needed. For asthma (Patient not taking: Reported on 03/19/2015) 60 each   . hydrochlorothiazide (HYDRODIURIL) 25 MG tablet TAKE ONE TABLET BY MOUTH ONCE DAILY FOR  EDEMA (Patient not taking: Reported on 09/29/2015) 30 tablet 0  . HYDROcodone-acetaminophen (NORCO/VICODIN) 5-325 MG per tablet Take 1-2 tablets by mouth every 6 (six) hours as needed. (Patient not taking: Reported on 09/29/2015) 8 tablet 0  . levothyroxine (SYNTHROID, LEVOTHROID) 125 MCG tablet TAKE ONE TABLET BY MOUTH ONCE DAILY BEFORE  BREAKFAST  FOR  HYPOTHYROIDISM (Patient not taking: Reported on 09/29/2015) 30 tablet 0  . medroxyPROGESTERone (DEPO-PROVERA) 150 MG/ML injection Inject 1 mL (150 mg total) into the muscle every 3 (three) months. For contraception. (Patient not taking: Reported on 01/01/2015) 1 mL 3  . nitrofurantoin, macrocrystal-monohydrate, (MACROBID) 100 MG capsule Take 1 capsule (100 mg total) by mouth 2 (two) times daily. (Patient not taking: Reported on 03/19/2015) 10 capsule 0  . phenazopyridine (PYRIDIUM) 200 MG tablet Take 1 tablet (200 mg total) by mouth 3 (three) times daily. (Patient not taking: Reported on 03/19/2015) 6 tablet 0  . traMADol (ULTRAM) 50 MG tablet Take 1 tablet (50 mg total)  by mouth every 6 (six) hours as needed. (Patient not taking: Reported on 03/19/2015) 6 tablet 0   Allergies  Allergen Reactions  . Ivp Dye [Iodinated Diagnostic Agents] Anaphylaxis    "Almost died"  . Penicillins Hives    Has patient had a PCN reaction causing immediate rash, facial/tongue/throat swelling, SOB or lightheadedness with hypotension: No Has patient had a PCN reaction causing severe rash involving mucus membranes or skin necrosis: No Has patient had a PCN reaction that required hospitalization No Has patient had a PCN  reaction occurring within the last 10 years: No If all of the above answers are "NO", then may proceed with Cephalosporin use.     ROS:  Review of Systems  Constitutional: Negative for fever, chills and fatigue.  HENT: Negative for sinus pressure.   Eyes: Negative for photophobia.  Respiratory: Negative for shortness of breath.   Cardiovascular: Negative for chest pain.  Gastrointestinal: Negative for nausea, vomiting, diarrhea and constipation.  Genitourinary: Negative for dysuria, frequency, flank pain, vaginal bleeding, vaginal discharge, difficulty urinating, vaginal pain and pelvic pain.  Musculoskeletal: Negative for neck pain.  Neurological: Negative for dizziness, weakness and headaches.  Psychiatric/Behavioral: Negative.      I have reviewed patient's Past Medical Hx, Surgical Hx, Family Hx, Social Hx, medications and allergies.   Physical Exam  Patient Vitals for the past 24 hrs:  BP Temp Temp src Pulse Resp Weight  09/29/15 1524 131/88 mmHg 98.4 F (36.9 C) Oral 80 18 243 lb 9.6 oz (110.496 kg)   Constitutional: Well-developed, well-nourished female in no acute distress.  Cardiovascular: normal rate Respiratory: normal effort GI: Abd soft, non-tender. Pos BS x 4 MS: Extremities nontender, no edema, normal ROM Neurologic: Alert and oriented x 4.  GU: Neg CVAT.  PELVIC EXAM: Cervix pink, visually closed, without lesion, scant white creamy discharge, vaginal walls and external genitalia normal Bimanual exam: Cervix 0/long/high, firm, anterior, positive CMT, uterus nontender, nonenlarged, adnexa without tenderness, enlargement, or mass   LAB RESULTS Results for orders placed or performed during the hospital encounter of 09/29/15 (from the past 24 hour(s))  Urinalysis, Routine w reflex microscopic (not at [facility])     Status: None   Collection Time: 09/29/15  3:25 PM  Result Value Ref Range   Color, Urine YELLOW YELLOW   APPearance CLEAR CLEAR   Specific Gravity,  Urine 1.020 1.005 - 1.030   pH 6.0 5.0 - 8.0   Glucose, UA NEGATIVE NEGATIVE mg/dL   Hgb urine dipstick NEGATIVE NEGATIVE   Bilirubin Urine NEGATIVE NEGATIVE   Ketones, ur NEGATIVE NEGATIVE mg/dL   Protein, ur NEGATIVE NEGATIVE mg/dL   Nitrite NEGATIVE NEGATIVE   Leukocytes, UA NEGATIVE NEGATIVE  Pregnancy, urine POC     Status: None   Collection Time: 09/29/15  3:31 PM  Result Value Ref Range   Preg Test, Ur NEGATIVE NEGATIVE  CBC     Status: Abnormal   Collection Time: 09/29/15  4:00 PM  Result Value Ref Range   WBC 5.9 4.0 - 10.5 K/uL   RBC 4.34 3.87 - 5.11 MIL/uL   Hemoglobin 12.6 12.0 - 15.0 g/dL   HCT 37.9 36.0 - 46.0 %   MCV 87.3 78.0 - 100.0 fL   MCH 29.0 26.0 - 34.0 pg   MCHC 33.2 30.0 - 36.0 g/dL   RDW 15.7 (H) 11.5 - 15.5 %   Platelets 220 150 - 400 K/uL  Wet prep, genital     Status: Abnormal   Collection Time: 09/29/15  4:39 PM  Result Value Ref Range   Yeast Wet Prep HPF POC NONE SEEN NONE SEEN   Trich, Wet Prep NONE SEEN NONE SEEN   Clue Cells Wet Prep HPF POC PRESENT (A) NONE SEEN   WBC, Wet Prep HPF POC NONE SEEN NONE SEEN   Sperm NONE SEEN        IMAGING No results found.  MAU Management/MDM: Ordered labs and reviewed results.  Reassurance provided that exam in MAU today normal except some pain with cervical motion.  Wet prep with clue cells present, but no indication of amount (few, moderate, large?) and no evidence of BV on pelvic exam or discharge/odor reported by pt.  Will not treat BV at today's visit.  Plan for outpatient f/u with ultrasound and appt in Gyn clinic.  Pt stable at time of discharge.  ASSESSMENT 1. Chronic pelvic pain in female     PLAN Discharge home Message to [facility] for f/u appt Outpatient  [place] ordered F/U in MAU as needed for emergencies    Medication List    STOP taking these medications        benazepril 40 MG tablet  Commonly known as:  LOTENSIN     hydrochlorothiazide 25 MG tablet  Commonly known as:   HYDRODIURIL     HYDROcodone-acetaminophen 5-325 MG tablet  Commonly known as:  NORCO/VICODIN     levothyroxine 125 MCG tablet  Commonly known as:  SYNTHROID, LEVOTHROID     medroxyPROGESTERone 150 MG/ML injection  Commonly known as:  DEPO-PROVERA     nitrofurantoin (macrocrystal-monohydrate) 100 MG capsule  Commonly known as:  MACROBID     phenazopyridine 200 MG tablet  Commonly known as:  PYRIDIUM     traMADol 50 MG tablet  Commonly known as:  ULTRAM      TAKE these medications        Fluticasone-Salmeterol 100-50 MCG/DOSE Aepb  Commonly known as:  ADVAIR  Inhale 1 puff into the lungs daily as needed. For asthma     ibuprofen 600 MG tablet  Commonly known as:  ADVIL,MOTRIN  Take 1 tablet (600 mg total) by mouth every 6 (six) hours as needed.           Follow-up Information    Follow up with [facility].   Specialty:  Obstetrics and Gynecology   Why:  The clinic will call you with appointment.   Contact information:   [address] Certified Nurse-Midwife 09/29/2015  5:20 PM

## 2015-09-29 NOTE — MAU Note (Addendum)
Feels pressure in lower abd, when had intercourse it was painful and she bled after.  Last time she had pain like this, she had a UTI

## 2015-09-29 NOTE — MAU Note (Addendum)
Off and on for the past 2 months has been having pain in lower and on her side.  Past 2 weeks it has bee constant. Neg HPT. No GI problems, denies urinary symptoms

## 2015-09-29 NOTE — Discharge Instructions (Signed)

## 2015-09-30 LAB — HIV ANTIBODY (ROUTINE TESTING W REFLEX): HIV SCREEN 4TH GENERATION: NONREACTIVE

## 2015-10-01 ENCOUNTER — Other Ambulatory Visit: Payer: Medicare Other

## 2015-10-01 LAB — GC/CHLAMYDIA PROBE AMP (~~LOC~~) NOT AT ARMC
CHLAMYDIA, DNA PROBE: NEGATIVE
NEISSERIA GONORRHEA: NEGATIVE

## 2015-10-08 ENCOUNTER — Ambulatory Visit: Payer: Medicaid Other | Admitting: Physician Assistant

## 2015-10-15 ENCOUNTER — Encounter: Payer: Self-pay | Admitting: Physician Assistant

## 2015-10-15 ENCOUNTER — Ambulatory Visit (INDEPENDENT_AMBULATORY_CARE_PROVIDER_SITE_OTHER): Payer: Medicare Other | Admitting: Physician Assistant

## 2015-10-15 VITALS — BP 120/88 | HR 80 | Temp 98.6°F | Resp 16 | Ht 62.5 in | Wt 245.0 lb

## 2015-10-15 DIAGNOSIS — I10 Essential (primary) hypertension: Secondary | ICD-10-CM

## 2015-10-15 DIAGNOSIS — E079 Disorder of thyroid, unspecified: Secondary | ICD-10-CM

## 2015-10-15 DIAGNOSIS — E669 Obesity, unspecified: Secondary | ICD-10-CM | POA: Diagnosis not present

## 2015-10-15 DIAGNOSIS — F32A Depression, unspecified: Secondary | ICD-10-CM

## 2015-10-15 DIAGNOSIS — J4520 Mild intermittent asthma, uncomplicated: Secondary | ICD-10-CM

## 2015-10-15 DIAGNOSIS — F329 Major depressive disorder, single episode, unspecified: Secondary | ICD-10-CM | POA: Diagnosis not present

## 2015-10-15 MED ORDER — BENAZEPRIL HCL 10 MG PO TABS: 10.0000 mg | ORAL_TABLET | Freq: Every day | ORAL | Status: DC
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
## 2015-10-16 ENCOUNTER — Other Ambulatory Visit: Payer: Self-pay | Admitting: Family Medicine

## 2015-10-16 DIAGNOSIS — E039 Hypothyroidism, unspecified: Secondary | ICD-10-CM

## 2015-10-16 DIAGNOSIS — Z79899 Other long term (current) drug therapy: Secondary | ICD-10-CM

## 2015-10-16 LAB — BASIC METABOLIC PANEL WITH GFR
BUN: 13 mg/dL (ref 7–25)
CO2: 20 mmol/L (ref 20–31)
Calcium: 8.8 mg/dL (ref 8.6–10.2)
Chloride: 104 mmol/L (ref 98–110)
Creat: 0.76 mg/dL (ref 0.50–1.10)
GLUCOSE: 90 mg/dL (ref 70–99)
POTASSIUM: 3.4 mmol/L — AB (ref 3.5–5.3)
Sodium: 140 mmol/L (ref 135–146)

## 2015-10-16 LAB — TSH: TSH: 22.808 u[IU]/mL — ABNORMAL HIGH (ref 0.350–4.500)

## 2015-10-16 MED ORDER — LEVOTHYROXINE SODIUM 125 MCG PO TABS: 125.0000 ug | ORAL_TABLET | Freq: Every day | ORAL | Status: DC

## 2015-10-22 ENCOUNTER — Ambulatory Visit (HOSPITAL_COMMUNITY)
Admission: RE | Admit: 2015-10-22 | Discharge: 2015-10-22 | Disposition: A | Discharge status: Home / Self Care | Payer: Medicare Other | Source: Ambulatory Visit | Attending: Advanced Practice Midwife | Admitting: Advanced Practice Midwife

## 2015-10-22 DIAGNOSIS — G8929 Other chronic pain: Secondary | ICD-10-CM

## 2015-10-22 DIAGNOSIS — R102 Pelvic and perineal pain: Secondary | ICD-10-CM | POA: Diagnosis not present

## 2015-10-29 ENCOUNTER — Encounter: Payer: Self-pay | Admitting: Physician Assistant

## 2015-10-29 ENCOUNTER — Ambulatory Visit (INDEPENDENT_AMBULATORY_CARE_PROVIDER_SITE_OTHER): Payer: Medicare Other | Admitting: Physician Assistant

## 2015-10-29 VITALS — BP 120/86 | HR 80 | Temp 98.5°F | Resp 18 | Wt 251.0 lb

## 2015-10-29 DIAGNOSIS — F419 Anxiety disorder, unspecified: Secondary | ICD-10-CM | POA: Diagnosis not present

## 2015-10-29 DIAGNOSIS — F329 Major depressive disorder, single episode, unspecified: Secondary | ICD-10-CM

## 2015-10-29 DIAGNOSIS — I10 Essential (primary) hypertension: Secondary | ICD-10-CM

## 2015-10-29 DIAGNOSIS — E669 Obesity, unspecified: Secondary | ICD-10-CM | POA: Diagnosis not present

## 2015-10-29 DIAGNOSIS — E079 Disorder of thyroid, unspecified: Secondary | ICD-10-CM | POA: Diagnosis not present

## 2015-10-29 DIAGNOSIS — J4520 Mild intermittent asthma, uncomplicated: Secondary | ICD-10-CM | POA: Diagnosis not present

## 2015-10-29 DIAGNOSIS — F32A Depression, unspecified: Secondary | ICD-10-CM

## 2015-10-29 MED ORDER — BENAZEPRIL HCL 10 MG PO TABS: 10.0000 mg | ORAL_TABLET | Freq: Every day | ORAL | Status: DC
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
## 2015-10-30 LAB — BASIC METABOLIC PANEL WITH GFR
BUN: 11 mg/dL (ref 7–25)
CHLORIDE: 107 mmol/L (ref 98–110)
CO2: 27 mmol/L (ref 20–31)
Calcium: 8.7 mg/dL (ref 8.6–10.2)
Creat: 0.85 mg/dL (ref 0.50–1.10)
GFR, EST NON AFRICAN AMERICAN: 85 mL/min (ref >=60)
GFR, Est African American: >89 mL/min (ref >=60)
GLUCOSE: 117 mg/dL — AB (ref 70–99)
POTASSIUM: 3.8 mmol/L (ref 3.5–5.3)
Sodium: 141 mmol/L (ref 135–146)

## 2015-10-31 ENCOUNTER — Encounter: Payer: Self-pay | Admitting: Family Medicine

## 2015-11-10 ENCOUNTER — Encounter: Payer: Self-pay | Admitting: Student

## 2015-11-10 ENCOUNTER — Ambulatory Visit (INDEPENDENT_AMBULATORY_CARE_PROVIDER_SITE_OTHER): Payer: Medicare Other | Admitting: Student

## 2015-11-10 VITALS — BP 127/90 | HR 69 | Ht 62.0 in | Wt 245.0 lb

## 2015-11-10 DIAGNOSIS — R102 Pelvic and perineal pain unspecified side: Secondary | ICD-10-CM

## 2015-11-10 DIAGNOSIS — Z30011 Encounter for initial prescription of contraceptive pills: Secondary | ICD-10-CM | POA: Diagnosis not present

## 2015-11-10 DIAGNOSIS — N72 Inflammatory disease of cervix uteri: Secondary | ICD-10-CM

## 2015-11-10 MED ORDER — METRONIDAZOLE 500 MG PO TABS: 500.0000 mg | ORAL_TABLET | Freq: Two times a day (BID) | ORAL | Status: DC

## 2015-11-10 MED ORDER — DOXYCYCLINE HYCLATE 50 MG PO CAPS: 100.0000 mg | ORAL_CAPSULE | Freq: Two times a day (BID) | ORAL | Status: DC

## 2015-11-10 MED ORDER — NORETHINDRONE 0.35 MG PO TABS: 1.0000 | ORAL_TABLET | Freq: Every day | ORAL | Status: DC

## 2015-11-10 MED ORDER — IBUPROFEN 600 MG PO TABS: 600.0000 mg | ORAL_TABLET | Freq: Four times a day (QID) | ORAL | Status: DC | PRN

## 2015-11-10 NOTE — Progress Notes (Signed)
   Subjective:    Patient ID: Janice Roberts, female    DOB: 10-Dec-1975, 40 y.o.   MRN: [id number]  HPI AMIAYAH Roberts is a 40 y.o. female who presents for pelvic pain & ultrasound results. Upon review of records, patient was seen in [facility] on 11/21 for pelvic pain. On that visit had negative GC/CT & wet prep positive for clue cells, no WBCs. CMT present on that exam. Patient was scheduled for outpatient ultrasound; results normal.    Per patient, reports daily pelvic pain since the beginning of last year after having IUD removed. States IUD was removed 1 month after placement d/t pain. Reports daily lower abdominal/pelvic pain that she describes as pressure & cramp like. Rates as 5/10. Has not treated. Worse after intercourse. No alleviating factors. No post coital bleeding. Reports occasional discharge, but none in the last several weeks.  Additionally, she wants to discuss contraception.   Review of Systems  Constitutional: Negative for fever and chills.  Gastrointestinal: Positive for abdominal pain. Negative for nausea, vomiting, diarrhea, constipation and abdominal distention.  Genitourinary: Positive for vaginal discharge, pelvic pain and dyspareunia. Negative for dysuria, urgency, vaginal bleeding, genital sores, vaginal pain and menstrual problem.      Objective:   Physical Exam  Constitutional: She appears well-developed and well-nourished. No distress.  HENT:  Head: Normocephalic and atraumatic.  Eyes: Conjunctivae are normal. Right eye exhibits no discharge. Left eye exhibits no discharge. No scleral icterus.  Pulmonary/Chest: Effort normal. No respiratory distress.  Musculoskeletal: Normal range of motion.  Skin: She is not diaphoretic.  Psychiatric: She has a normal mood and affect. Her behavior is normal. Judgment and thought content normal.   BP 127/90 mmHg  Pulse 69  Ht 5\' 2"  (1.575 m)  Wt 245 lb (111.131 kg)  BMI 44.80 kg/m2  LMP 10/25/2015     Assessment & Plan:    Conferred with Dr. Roselie Awkward - recommended treatment for cervicitis based on previous exam in addition to POPs & NSAIDs that I ordered.    1. Pelvic pain in female  - ibuprofen (ADVIL,MOTRIN) 600 MG tablet; Take 1 tablet (600 mg total) by mouth every 6 (six) hours as needed.  Dispense: 30 tablet; Refill: 0 - norethindrone (MICRONOR,CAMILA,ERRIN) 0.35 MG tablet; Take 1 tablet (0.35 mg total) by mouth daily.  Dispense: 1 Package; Refill: 11  2. Cervicitis  - metroNIDAZOLE (FLAGYL) 500 MG tablet; Take 1 tablet (500 mg total) by mouth 2 (two) times daily.  Dispense: 14 tablet; Refill: 0 - doxycycline (VIBRAMYCIN) 50 MG capsule; Take 2 capsules (100 mg total) by mouth 2 (two) times daily.  Dispense: 40 capsule; Refill: 0  3. Encounter for initial prescription of contraceptive pills  - norethindrone (MICRONOR,CAMILA,ERRIN) 0.35 MG tablet; Take 1 tablet (0.35 mg total) by mouth daily.  Dispense: 1 Package; Refill: 11    Patient to f/u with MD in 2 months to evaluate treatment & possible further evaluation vs treatment if pelvic pain persists.   Janice Guild, NP

## 2015-11-10 NOTE — Patient Instructions (Signed)

## 2016-01-05 ENCOUNTER — Ambulatory Visit (INDEPENDENT_AMBULATORY_CARE_PROVIDER_SITE_OTHER): Payer: Medicare Other | Admitting: Obstetrics & Gynecology

## 2016-01-05 ENCOUNTER — Encounter: Payer: Self-pay | Admitting: Obstetrics & Gynecology

## 2016-01-05 VITALS — BP 118/84 | HR 79 | Ht 62.0 in | Wt 249.0 lb

## 2016-01-05 DIAGNOSIS — R102 Pelvic and perineal pain: Secondary | ICD-10-CM

## 2016-01-05 MED ORDER — AMITRIPTYLINE HCL 25 MG PO TABS: 25.0000 mg | ORAL_TABLET | Freq: Every day | ORAL | Status: DC

## 2016-01-05 NOTE — Patient Instructions (Signed)

## 2016-01-05 NOTE — Progress Notes (Signed)
[facility] referral appt scheduled on April 24th @ 1000.  Pt notified.

## 2016-01-05 NOTE — Progress Notes (Signed)
Patient ID: Janice Roberts, female   DOB: [birth year], 40 y.o.   MRN: [id number]  Chief Complaint  Patient presents with  . Follow-up  pelvic pain for several months  HPI Janice Roberts is a 40 y.o. female.  [id number] Patient's last menstrual period was 12/08/2015 (exact date).  Patent has BTB on Micronor no bleeding currently.  HPI  Past Medical History  Diagnosis Date  . Asthma   . Obesity   . Hypertension   . Thyroid disease   . Anxiety   . Depression     Past Surgical History  Procedure Laterality Date  . Thyroidectomy    . Cesarean section    . Appendectomy    . Dilation and curettage of uterus  08/08/2008 & 03/08/2013    Family History  Problem Relation Age of Onset  . Cancer Mother 39    Breast  . Asthma Sister   . Asthma Brother   . Asthma Sister   . Birth defects Sister     Social History Social History  Substance Use Topics  . Smoking status: Never Smoker   . Smokeless tobacco: Never Used  . Alcohol Use: No    Allergies  Allergen Reactions  . Ivp Dye [Iodinated Diagnostic Agents] Anaphylaxis    "Almost died"  . Penicillins Hives    Has patient had a PCN reaction causing immediate rash, facial/tongue/throat swelling, SOB or lightheadedness with hypotension: No Has patient had a PCN reaction causing severe rash involving mucus membranes or skin necrosis: No Has patient had a PCN reaction that required hospitalization No Has patient had a PCN reaction occurring within the last 10 years: No If all of the above answers are "NO", then may proceed with Cephalosporin use.     Current Outpatient Prescriptions  Medication Sig Dispense Refill  . benazepril (LOTENSIN) 10 MG tablet Take 1 tablet (10 mg total) by mouth daily. 90 tablet 1  . ibuprofen (ADVIL,MOTRIN) 600 MG tablet Take 1 tablet (600 mg total) by mouth every 6 (six) hours as needed. 30 tablet 0  . levothyroxine (SYNTHROID, LEVOTHROID) 125 MCG tablet Take 1 tablet (125 mcg total) by mouth daily.  30 tablet 1  . norethindrone (MICRONOR,CAMILA,ERRIN) 0.35 MG tablet Take 1 tablet (0.35 mg total) by mouth daily. 1 Package 11  . amitriptyline (ELAVIL) 25 MG tablet Take 1 tablet (25 mg total) by mouth at bedtime. 30 tablet 3  . doxycycline (VIBRAMYCIN) 50 MG capsule Take 2 capsules (100 mg total) by mouth 2 (two) times daily. (Patient not taking: Reported on 01/05/2016) 40 capsule 0  . Fluticasone-Salmeterol (ADVAIR) 100-50 MCG/DOSE AEPB Inhale 1 puff into the lungs daily as needed. For asthma (Patient not taking: Reported on 03/19/2015) 60 each   . metroNIDAZOLE (FLAGYL) 500 MG tablet Take 1 tablet (500 mg total) by mouth 2 (two) times daily. (Patient not taking: Reported on 01/05/2016) 14 tablet 0   No current facility-administered medications for this visit.    Review of Systems Review of Systems  Genitourinary: Positive for dysuria, vaginal bleeding and menstrual problem. Negative for vaginal discharge. Frequency: urge and nocturia.  Psychiatric/Behavioral: Positive for sleep disturbance. The patient is nervous/anxious.     Blood pressure 118/84, pulse 79, height 5\' 2"  (1.575 m), weight 249 lb (112.946 kg), last menstrual period 12/08/2015.  Physical Exam Physical Exam  Data Reviewed CLINICAL DATA: Chronic pelvic pain.  EXAM: TRANSABDOMINAL AND TRANSVAGINAL ULTRASOUND OF PELVIS  TECHNIQUE: Both transabdominal and transvaginal ultrasound examinations of the pelvis were performed.  Transabdominal technique was performed for global imaging of the pelvis including uterus, ovaries, adnexal regions, and pelvic cul-de-sac. It was necessary to proceed with endovaginal exam following the transabdominal exam to visualize the uterus and adnexa.  COMPARISON: Ultrasound 03/07/2013.  FINDINGS: Uterus  Measurements: 9.2 x 6.3 x 6.8 cm. No fibroids or other mass visualized. Uterus is heterogeneous, adenomyosis cannot be excluded.  Endometrium  Thickness: 4 mm. No focal  abnormality visualized.  Right ovary  Measurements: 2.2 x 1.1 x 1.4 cm. Normal appearance/no adnexal mass.  Left ovary  Measurements: 2.1 x 1.4 x 1.0 cm. Normal appearance/no adnexal mass.  Other findings  No free fluid.  IMPRESSION: Heterogeneous uterus. Adenomyosis cannot be excluded. No focal uterine abnormality identified. Adnexa and ovaries unremarkable. No free pelvic fluid.   Electronically Signed  By: Marcello Moores Register  On: 10/22/2015 10:35        Assessment    Pelvic pain, urinary urge and frequency  BTB on POP Insomnia    Plan    Amitriptyline 25 mg PO HS   RTC 3 months Continue norethindrone 0.35 mg Referral to urology for suspected IC       ARNOLD,JAMES 01/05/2016, 3:56 PM

## 2016-01-09 ENCOUNTER — Other Ambulatory Visit: Payer: Self-pay | Admitting: Physician Assistant

## 2016-01-12 ENCOUNTER — Encounter: Payer: Self-pay | Admitting: Family Medicine

## 2016-01-12 NOTE — Telephone Encounter (Signed)
Medication refill for one time only.  Patient needs to be seen for repeat lab work.   Letter sent for patient to call and schedule

## 2016-02-16 ENCOUNTER — Ambulatory Visit: Payer: Medicare Other | Admitting: Physician Assistant

## 2016-02-18 ENCOUNTER — Encounter: Payer: Self-pay | Admitting: Physician Assistant

## 2016-02-18 ENCOUNTER — Ambulatory Visit (INDEPENDENT_AMBULATORY_CARE_PROVIDER_SITE_OTHER): Payer: Medicare Other | Admitting: Physician Assistant

## 2016-02-18 VITALS — BP 136/84 | HR 80 | Temp 98.6°F | Resp 20 | Ht 62.0 in | Wt 247.0 lb

## 2016-02-18 DIAGNOSIS — F329 Major depressive disorder, single episode, unspecified: Secondary | ICD-10-CM

## 2016-02-18 DIAGNOSIS — F32A Depression, unspecified: Secondary | ICD-10-CM

## 2016-02-18 DIAGNOSIS — I10 Essential (primary) hypertension: Secondary | ICD-10-CM

## 2016-02-18 DIAGNOSIS — F419 Anxiety disorder, unspecified: Secondary | ICD-10-CM | POA: Diagnosis not present

## 2016-02-18 DIAGNOSIS — E669 Obesity, unspecified: Secondary | ICD-10-CM | POA: Diagnosis not present

## 2016-02-18 DIAGNOSIS — J4520 Mild intermittent asthma, uncomplicated: Secondary | ICD-10-CM | POA: Diagnosis not present

## 2016-02-18 DIAGNOSIS — Z13220 Encounter for screening for lipoid disorders: Secondary | ICD-10-CM

## 2016-02-18 DIAGNOSIS — E079 Disorder of thyroid, unspecified: Secondary | ICD-10-CM | POA: Diagnosis not present
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
## 2016-02-19 ENCOUNTER — Encounter: Payer: Self-pay | Admitting: Family Medicine

## 2016-02-19 LAB — COMPLETE METABOLIC PANEL WITH GFR
ALT: 10 U/L (ref 6–29)
AST: 11 U/L (ref 10–30)
Albumin: 3.7 g/dL (ref 3.6–5.1)
Alkaline Phosphatase: 54 U/L (ref 33–115)
BILIRUBIN TOTAL: 0.4 mg/dL (ref 0.2–1.2)
BUN: 7 mg/dL (ref 7–25)
CALCIUM: 8.4 mg/dL — AB (ref 8.6–10.2)
CHLORIDE: 107 mmol/L (ref 98–110)
CO2: 23 mmol/L (ref 20–31)
CREATININE: 0.72 mg/dL (ref 0.50–1.10)
GFR, Est Non African American: >89 mL/min (ref >=60)
Glucose, Bld: 96 mg/dL (ref 70–99)
Potassium: 4.4 mmol/L (ref 3.5–5.3)
Sodium: 137 mmol/L (ref 135–146)
Total Protein: 6.5 g/dL (ref 6.1–8.1)

## 2016-02-19 LAB — LIPID PANEL
CHOL/HDL RATIO: 2.9 ratio (ref <=5.0)
CHOLESTEROL: 174 mg/dL (ref 125–200)
HDL: 61 mg/dL (ref >=46)
LDL Cholesterol: 97 mg/dL (ref <130)
Triglycerides: 79 mg/dL (ref <150)
VLDL: 16 mg/dL (ref <30)

## 2016-02-19 LAB — TSH: TSH: 0.74 m[IU]/L

## 2016-02-24 ENCOUNTER — Encounter: Payer: Self-pay | Admitting: *Deleted

## 2016-02-26 ENCOUNTER — Telehealth: Payer: Self-pay | Admitting: *Deleted

## 2016-02-26 MED ORDER — LEVOTHYROXINE SODIUM 125 MCG PO TABS: 125.0000 ug | ORAL_TABLET | Freq: Every day | ORAL | Status: DC

## 2016-02-26 NOTE — Telephone Encounter (Signed)
Refill sent.

## 2016-02-26 NOTE — Telephone Encounter (Signed)
Patient is out of thyroid med and would like it to be sent to [facility]

## 2016-03-01 DIAGNOSIS — Z0000 Encounter for general adult medical examination without abnormal findings: Secondary | ICD-10-CM | POA: Diagnosis not present

## 2016-03-01 DIAGNOSIS — R102 Pelvic and perineal pain: Secondary | ICD-10-CM | POA: Diagnosis not present

## 2016-03-01 DIAGNOSIS — M6289 Other specified disorders of muscle: Secondary | ICD-10-CM | POA: Diagnosis not present

## 2016-03-02 DIAGNOSIS — N942 Vaginismus: Secondary | ICD-10-CM | POA: Diagnosis not present

## 2016-03-02 DIAGNOSIS — R102 Pelvic and perineal pain: Secondary | ICD-10-CM | POA: Diagnosis not present

## 2016-03-02 DIAGNOSIS — M62838 Other muscle spasm: Secondary | ICD-10-CM | POA: Diagnosis not present

## 2016-03-02 DIAGNOSIS — R278 Other lack of coordination: Secondary | ICD-10-CM | POA: Diagnosis not present

## 2016-03-10 ENCOUNTER — Encounter: Payer: Self-pay | Admitting: *Deleted

## 2016-04-04 ENCOUNTER — Encounter: Payer: Self-pay | Admitting: *Deleted

## 2016-05-18 ENCOUNTER — Encounter: Payer: Self-pay | Admitting: *Deleted

## 2016-06-15 ENCOUNTER — Ambulatory Visit: Payer: Medicare Other | Admitting: Family Medicine

## 2016-06-18 ENCOUNTER — Other Ambulatory Visit: Payer: Self-pay | Admitting: Physician Assistant

## 2016-06-18 DIAGNOSIS — I10 Essential (primary) hypertension: Secondary | ICD-10-CM

## 2016-06-21 NOTE — Telephone Encounter (Signed)
Medication refilled per protocol. 

## 2016-07-06 ENCOUNTER — Encounter (HOSPITAL_COMMUNITY): Payer: Self-pay | Admitting: Emergency Medicine

## 2016-07-06 ENCOUNTER — Ambulatory Visit (HOSPITAL_COMMUNITY)
Admission: EM | Admit: 2016-07-06 | Discharge: 2016-07-06 | Disposition: A | Discharge status: Home / Self Care | Payer: Medicare Other | Attending: Family Medicine | Admitting: Family Medicine

## 2016-07-06 DIAGNOSIS — S8001XA Contusion of right knee, initial encounter: Secondary | ICD-10-CM | POA: Diagnosis not present

## 2016-07-06 MED ORDER — DICLOFENAC POTASSIUM 50 MG PO TABS: 50.0000 mg | ORAL_TABLET | Freq: Three times a day (TID) | ORAL | 0 refills | Status: DC

## 2016-07-06 NOTE — ED Provider Notes (Signed)
[facility]    CSN: [id number] Arrival date & time: 07/06/16  [id number]  First Provider Contact:  First MD Initiated Contact with Patient 07/06/16 1923        History   Chief Complaint Chief Complaint  Patient presents with  . Knee Pain    HPI Janice Roberts is a 40 y.o. female.   The history is provided by the patient.  Knee Pain  Location:  Knee Time since incident:  1 day Injury: no   Knee location:  R knee Pain details:    Quality:  Sharp   Radiates to:  Does not radiate   Severity:  Moderate   Onset quality:  Sudden   Progression:  Unchanged Chronicity:  New Dislocation: no   Foreign body present:  No foreign bodies Prior injury to area:  No Relieved by:  None tried Worsened by:  Nothing Ineffective treatments:  None tried Associated symptoms: decreased ROM   Associated symptoms: no back pain   Risk factors: obesity     Past Medical History:  Diagnosis Date  . Anxiety   . Asthma   . Depression   . Hypertension   . Obesity   . Thyroid disease     Patient Active Problem List   Diagnosis Date Noted  . Pelvic pain in female 11/10/2015  . Anxiety   . Depression   . Asthma   . Obesity   . Hypertension   . Thyroid disease     Past Surgical History:  Procedure Laterality Date  . APPENDECTOMY    . CESAREAN SECTION    . [place] OF UTERUS  08/08/2008 & 03/08/2013  . THYROIDECTOMY      OB History    Gravida Para Term Preterm AB Living   10 2     8 2    SAB TAB Ectopic Multiple Live Births   1 7             Home Medications    Prior to Admission medications   Medication Sig Start Date End Date Taking? Authorizing Provider  amitriptyline (ELAVIL) 25 MG tablet Take 1 tablet (25 mg total) by mouth at bedtime. 01/05/16   Woodroe Mode, MD  benazepril (LOTENSIN) 10 MG tablet TAKE ONE TABLET BY MOUTH  DAILY 06/21/16   [name], PA-C  levothyroxine (SYNTHROID, LEVOTHROID) 125 MCG tablet Take 1 tablet (125 mcg total) by  mouth daily before breakfast. 02/26/16   [name], PA-C  norethindrone (MICRONOR,CAMILA,ERRIN) 0.35 MG tablet Take 1 tablet (0.35 mg total) by mouth daily. 11/10/15   Jorje Guild, NP    Family History Family History  Problem Relation Age of Onset  . Cancer Mother 107    Breast  . Asthma Sister   . Asthma Brother   . Asthma Sister   . Birth defects Sister     Social History Social History  Substance Use Topics  . Smoking status: Never Smoker  . Smokeless tobacco: Never Used  . Alcohol use No     Allergies   Ivp dye [iodinated diagnostic agents] and Penicillins   Review of Systems Review of Systems  Constitutional: Negative.   Musculoskeletal: Positive for gait problem. Negative for back pain and joint swelling.  Skin: Negative.   All other systems reviewed and are negative.    Physical Exam Triage Vital Signs ED Triage Vitals  Enc Vitals Group     BP 07/06/16 1902 122/66     Pulse Rate  07/06/16 1902 80     Resp 07/06/16 1902 16     Temp 07/06/16 1902 98.6 F (37 C)     Temp Source 07/06/16 1902 Oral     SpO2 07/06/16 1902 100 %     Weight --      Height --      Head Circumference --      Peak Flow --      Pain Score 07/06/16 1903 9     Pain Loc --      Pain Edu? --      Excl. in [place]? --    No data found.   Updated Vital Signs BP 122/66 (BP Location: Right Arm)   Pulse 80   Temp 98.6 F (37 C) (Oral)   Resp 16   LMP 06/29/2016   SpO2 100%   Visual Acuity Right Eye Distance:   Left Eye Distance:   Bilateral Distance:    Right Eye Near:   Left Eye Near:    Bilateral Near:     Physical Exam  Constitutional: She is oriented to person, place, and time. She appears well-developed and well-nourished. No distress.  Musculoskeletal: She exhibits tenderness. She exhibits no edema or deformity.       Right knee: She exhibits decreased range of motion and abnormal patellar mobility. She exhibits no swelling, no effusion, no deformity and normal  alignment. Tenderness found. Patellar tendon tenderness noted.       Legs: Neurological: She is alert and oriented to person, place, and time.  Skin: Skin is warm and dry.  Nursing note and vitals reviewed.    UC Treatments / Results  Labs (all labs ordered are listed, but only abnormal results are displayed) Labs Reviewed - No data to display  EKG  EKG Interpretation None       Radiology No results found.  Procedures Procedures (including critical care time)  Medications Ordered in UC Medications - No data to display   Initial Impression / Assessment and Plan / UC Course  I have reviewed the triage vital signs and the nursing notes.  Pertinent labs & imaging results that were available during my care of the patient were reviewed by me and considered in my medical decision making (see chart for details).  Clinical Course      Final Clinical Impressions(s) / UC Diagnoses   Final diagnoses:  None    New Prescriptions New Prescriptions   No medications on file     Billy Fischer, MD 07/06/16 1932

## 2016-07-06 NOTE — ED Triage Notes (Signed)
Pt. Stated, I started having rt. Knee pain last night.  Today I can't hardly stand on it.

## 2016-07-10 ENCOUNTER — Encounter: Payer: Self-pay | Admitting: Physician Assistant

## 2016-07-14 ENCOUNTER — Emergency Department (HOSPITAL_COMMUNITY)
Admission: EM | Admit: 2016-07-14 | Discharge: 2016-07-15 | Disposition: A | Discharge status: Home / Self Care | Payer: Medicare Other | Attending: Emergency Medicine | Admitting: Emergency Medicine

## 2016-07-14 ENCOUNTER — Encounter (HOSPITAL_COMMUNITY): Payer: Self-pay | Admitting: Emergency Medicine

## 2016-07-14 DIAGNOSIS — Z79899 Other long term (current) drug therapy: Secondary | ICD-10-CM | POA: Diagnosis not present

## 2016-07-14 DIAGNOSIS — M5431 Sciatica, right side: Secondary | ICD-10-CM

## 2016-07-14 DIAGNOSIS — J45909 Unspecified asthma, uncomplicated: Secondary | ICD-10-CM | POA: Diagnosis not present

## 2016-07-14 DIAGNOSIS — I10 Essential (primary) hypertension: Secondary | ICD-10-CM | POA: Diagnosis not present

## 2016-07-14 DIAGNOSIS — M79604 Pain in right leg: Secondary | ICD-10-CM | POA: Diagnosis present

## 2016-07-14 LAB — URINALYSIS, ROUTINE W REFLEX MICROSCOPIC
BILIRUBIN URINE: NEGATIVE
GLUCOSE, UA: NEGATIVE mg/dL
HGB URINE DIPSTICK: NEGATIVE
Ketones, ur: NEGATIVE mg/dL
Nitrite: NEGATIVE
PH: 6 (ref 5.0–8.0)
Protein, ur: NEGATIVE mg/dL
SPECIFIC GRAVITY, URINE: 1.022 (ref 1.005–1.030)

## 2016-07-14 LAB — CBC WITH DIFFERENTIAL/PLATELET
BASOS ABS: 0 10*3/uL (ref 0.0–0.1)
BASOS PCT: 0 %
EOS ABS: 0.1 10*3/uL (ref 0.0–0.7)
EOS PCT: 1 %
HCT: 37.3 % (ref 36.0–46.0)
Hemoglobin: 12 g/dL (ref 12.0–15.0)
Lymphocytes Relative: 44 %
Lymphs Abs: 3.5 10*3/uL (ref 0.7–4.0)
MCH: 28.2 pg (ref 26.0–34.0)
MCHC: 32.2 g/dL (ref 30.0–36.0)
MCV: 87.6 fL (ref 78.0–100.0)
MONO ABS: 0.6 10*3/uL (ref 0.1–1.0)
MONOS PCT: 8 %
Neutro Abs: 3.7 10*3/uL (ref 1.7–7.7)
Neutrophils Relative %: 47 %
PLATELETS: 223 10*3/uL (ref 150–400)
RBC: 4.26 MIL/uL (ref 3.87–5.11)
RDW: 14.4 % (ref 11.5–15.5)
WBC: 7.9 10*3/uL (ref 4.0–10.5)

## 2016-07-14 LAB — BASIC METABOLIC PANEL
ANION GAP: 5 (ref 5–15)
BUN: 15 mg/dL (ref 6–20)
CALCIUM: 8.8 mg/dL — AB (ref 8.9–10.3)
CO2: 25 mmol/L (ref 22–32)
CREATININE: 0.9 mg/dL (ref 0.44–1.00)
Chloride: 107 mmol/L (ref 101–111)
GLUCOSE: 106 mg/dL — AB (ref 65–99)
Potassium: 3.9 mmol/L (ref 3.5–5.1)
Sodium: 137 mmol/L (ref 135–145)

## 2016-07-14 LAB — URINE MICROSCOPIC-ADD ON

## 2016-07-14 LAB — POC URINE PREG, ED: Preg Test, Ur: NEGATIVE

## 2016-07-14 MED ORDER — TRAMADOL HCL 50 MG PO TABS: 50.0000 mg | ORAL_TABLET | Freq: Four times a day (QID) | ORAL | 0 refills | Status: DC | PRN

## 2016-07-14 MED ORDER — PREDNISONE 20 MG PO TABS: ORAL_TABLET | ORAL | 0 refills | Status: DC

## 2016-07-14 NOTE — ED Provider Notes (Signed)
[facility] DEPT Provider Note   CSN: [id number] Arrival date & time: 07/14/16  2016   By signing my name below, I, Janice Roberts, attest that this documentation has been prepared under the direction and in the presence of Orpah Greek, MD  Electronically Signed: Delton Roberts, ED Scribe. 07/14/16. 11:28 PM.   History   Chief Complaint Chief Complaint  Patient presents with  . Leg Pain    The history is provided by the patient. No language interpreter was used.     HPI Comments:  Janice Roberts is a 40 y.o. female who presents to the Emergency Department complaining of intermittent, moderate, shooting, right leg pain that became gradually worse two weeks ago. Pt also notes associated back pain and numbness. Pt states the pain is in her right thigh and radiates her right leg. She reports the pain is exacerbated when she is relaxed.  She states she went to Urgent Care and they sent her home with medications that has not given her any relief. Pt notes that any movement modifies her leg pain. There are no other complaints.  Past Medical History:  Diagnosis Date  . Anxiety   . Asthma   . Depression   . Hypertension   . Obesity   . Thyroid disease     Patient Active Problem List   Diagnosis Date Noted  . Pelvic pain in female 11/10/2015  . Anxiety   . Depression   . Asthma   . Obesity   . Hypertension   . Thyroid disease     Past Surgical History:  Procedure Laterality Date  . APPENDECTOMY    . CESAREAN SECTION    . [place] OF UTERUS  08/08/2008 & 03/08/2013  . THYROIDECTOMY      OB History    Gravida Para Term Preterm AB Living   10 2     8 2    SAB TAB Ectopic Multiple Live Births   1 7             Home Medications    Prior to Admission medications   Medication Sig Start Date End Date Taking? Authorizing Provider  amitriptyline (ELAVIL) 25 MG tablet Take 1 tablet (25 mg total) by mouth at bedtime. 01/05/16   Woodroe Mode, MD    benazepril (LOTENSIN) 10 MG tablet TAKE ONE TABLET BY MOUTH  DAILY 06/21/16   [name], PA-C  diclofenac (CATAFLAM) 50 MG tablet Take 1 tablet (50 mg total) by mouth 3 (three) times daily. 07/06/16   Billy Fischer, MD  levothyroxine (SYNTHROID, LEVOTHROID) 125 MCG tablet Take 1 tablet (125 mcg total) by mouth daily before breakfast. 02/26/16   [name], PA-C  norethindrone (MICRONOR,CAMILA,ERRIN) 0.35 MG tablet Take 1 tablet (0.35 mg total) by mouth daily. 11/10/15   Jorje Guild, NP  predniSONE (DELTASONE) 20 MG tablet 3 tabs po daily x 3 days, then 2 tabs x 3 days, then 1.5 tabs x 3 days, then 1 tab x 3 days, then 0.5 tabs x 3 days 07/14/16   Orpah Greek, MD  traMADol (ULTRAM) 50 MG tablet Take 1 tablet (50 mg total) by mouth every 6 (six) hours as needed. 07/14/16   Orpah Greek, MD    Family History Family History  Problem Relation Age of Onset  . Cancer Mother 28    Breast  . Asthma Sister   . Asthma Brother   . Asthma Sister   . Birth defects Sister  Social History Social History  Substance Use Topics  . Smoking status: Never Smoker  . Smokeless tobacco: Never Used  . Alcohol use No     Allergies   Ivp dye [iodinated diagnostic agents] and Penicillins   Review of Systems Review of Systems  Musculoskeletal: Positive for back pain and myalgias.  Neurological: Positive for numbness.  All other systems reviewed and are negative.    Physical Exam Updated Vital Signs BP 117/85 (BP Location: Left Arm)   Pulse 79   Temp 98 F (36.7 C) (Oral)   Resp 20   Ht 5\' 2"  (1.575 m)   Wt 251 lb 7 oz (114.1 kg)   LMP 06/21/2016 (Approximate)   SpO2 99%   BMI 45.99 kg/m   Physical Exam  Constitutional: She is oriented to person, place, and time. She appears well-developed and well-nourished. No distress.  HENT:  Head: Normocephalic and atraumatic.  Right Ear: Hearing normal.  Left Ear: Hearing normal.  Nose: Nose normal.  Mouth/Throat: Oropharynx  is clear and moist and mucous membranes are normal.  Eyes: Conjunctivae and EOM are normal. Pupils are equal, round, and reactive to light.  Neck: Normal range of motion. Neck supple.  Cardiovascular: Regular rhythm, S1 normal and S2 normal.  Exam reveals no gallop and no friction rub.   No murmur heard. Pulmonary/Chest: Effort normal and breath sounds normal. No respiratory distress. She exhibits no tenderness.  Abdominal: Soft. Normal appearance and bowel sounds are normal. There is no hepatosplenomegaly. There is no tenderness. There is no rebound, no guarding, no tenderness at McBurney's point and negative Murphy's sign. No hernia.  Musculoskeletal: Normal range of motion.  +straight leg raise on R with no foot drop  Normal strength and sensations Patellar reflex 2+ DP Pulse  Neurological: She is alert and oriented to person, place, and time. She has normal strength. No cranial nerve deficit or sensory deficit. Coordination normal. GCS eye subscore is 4. GCS verbal subscore is 5. GCS motor subscore is 6.  Skin: Skin is warm, dry and intact. No rash noted. No cyanosis.  Psychiatric: She has a normal mood and affect. Her speech is normal and behavior is normal. Thought content normal.  Nursing note and vitals reviewed.    ED Treatments / Results  DIAGNOSTIC STUDIES:  Oxygen Saturation is 99% on RA, normal by my interpretation.    COORDINATION OF CARE:  11:25 PM Discussed treatment plan with pt at bedside and pt agreed to plan.  Labs (all labs ordered are listed, but only abnormal results are displayed) Labs Reviewed  BASIC METABOLIC PANEL - Abnormal; Notable for the following:       Result Value   Glucose, Bld 106 (*)    Calcium 8.8 (*)    All other components within normal limits  URINALYSIS, ROUTINE W REFLEX MICROSCOPIC (NOT AT [facility]) - Abnormal; Notable for the following:    APPearance CLOUDY (*)    Leukocytes, UA SMALL (*)    All other components within normal limits    URINE MICROSCOPIC-ADD ON - Abnormal; Notable for the following:    Squamous Epithelial / LPF 6-30 (*)    Bacteria, UA FEW (*)    All other components within normal limits  CBC WITH DIFFERENTIAL/PLATELET  POC URINE PREG, ED    EKG  EKG Interpretation None       Radiology No results found.  Procedures Procedures (including critical care time)  Medications Ordered in ED Medications - No data to display  Initial Impression / Assessment and Plan / ED Course  I have reviewed the triage vital signs and the nursing notes.  Pertinent labs & imaging results that were available during my care of the patient were reviewed by me and considered in my medical decision making (see chart for details).  Clinical Course   Patient presents to the ER with musculoskeletal back pain. Examination reveals back tenderness without any associated neurologic findings. Patient's strength, sensation and reflexes were normal. There is no evidence of saddle anesthesia. Patient does not have a foot drop. Patient has not experienced any change in bowel or bladder function. As such, patient did not require any imaging or further studies. Patient was treated with analgesia.  Final Clinical Impressions(s) / ED Diagnoses   Final diagnoses:  Sciatica of right side    New Prescriptions New Prescriptions   PREDNISONE (DELTASONE) 20 MG TABLET    3 tabs po daily x 3 days, then 2 tabs x 3 days, then 1.5 tabs x 3 days, then 1 tab x 3 days, then 0.5 tabs x 3 days   TRAMADOL (ULTRAM) 50 MG TABLET    Take 1 tablet (50 mg total) by mouth every 6 (six) hours as needed.  I personally performed the services described in this documentation, which was scribed in my presence. The recorded information has been reviewed and is accurate.     Orpah Greek, MD 07/16/16 0700

## 2016-07-14 NOTE — ED Triage Notes (Signed)
Pt. reports chronic right leg pain for several months got worse last night , pt. stated she accidentally hit her right knee against something 2 weeks ago , ambulatory /respirations unlabored .

## 2016-07-15 NOTE — ED Notes (Signed)
Pt provided with d/c instructions at this time. Pt verbalizes understanding of d/c instructions as well as follow up procedure after d/c.  Pt provided with RX for ultram and prednisone. Pt verbalizes understanding of RX directions. Pt in no apparent distress at this time. Pt ambulatory at time of d/c.

## 2016-08-19 ENCOUNTER — Ambulatory Visit: Payer: Medicare Other | Admitting: Physician Assistant

## 2016-09-22 ENCOUNTER — Other Ambulatory Visit: Payer: Self-pay | Admitting: Physician Assistant

## 2016-09-22 ENCOUNTER — Telehealth: Payer: Self-pay | Admitting: Physician Assistant

## 2016-10-07 ENCOUNTER — Encounter: Payer: Self-pay | Admitting: Physician Assistant

## 2016-10-07 ENCOUNTER — Ambulatory Visit (INDEPENDENT_AMBULATORY_CARE_PROVIDER_SITE_OTHER): Payer: Medicare Other | Admitting: Physician Assistant

## 2016-10-07 VITALS — BP 128/84 | HR 85 | Temp 98.8°F | Resp 16 | Wt 247.0 lb

## 2016-10-07 DIAGNOSIS — Z3041 Encounter for surveillance of contraceptive pills: Secondary | ICD-10-CM

## 2016-10-07 DIAGNOSIS — E6609 Other obesity due to excess calories: Secondary | ICD-10-CM

## 2016-10-07 DIAGNOSIS — I10 Essential (primary) hypertension: Secondary | ICD-10-CM | POA: Diagnosis not present

## 2016-10-07 DIAGNOSIS — E079 Disorder of thyroid, unspecified: Secondary | ICD-10-CM | POA: Diagnosis not present

## 2016-10-07 LAB — BASIC METABOLIC PANEL WITH GFR
BUN: 9 mg/dL (ref 7–25)
CALCIUM: 8.6 mg/dL (ref 8.6–10.2)
CHLORIDE: 107 mmol/L (ref 98–110)
CO2: 24 mmol/L (ref 20–31)
CREATININE: 0.73 mg/dL (ref 0.50–1.10)
GFR, Est African American: >89 mL/min (ref >=60)
GFR, Est Non African American: >89 mL/min (ref >=60)
GLUCOSE: 101 mg/dL — AB (ref 70–99)
Potassium: 3.9 mmol/L (ref 3.5–5.3)
Sodium: 138 mmol/L (ref 135–146)

## 2016-10-07 MED ORDER — CYCLOBENZAPRINE HCL 10 MG PO TABS: 10.0000 mg | ORAL_TABLET | Freq: Three times a day (TID) | ORAL | 0 refills | Status: DC | PRN
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
## 2016-10-08 ENCOUNTER — Telehealth: Payer: Self-pay

## 2016-10-08 DIAGNOSIS — E079 Disorder of thyroid, unspecified: Secondary | ICD-10-CM

## 2016-10-08 LAB — TSH: TSH: 0.04 mIU/L — ABNORMAL LOW

## 2016-10-08 MED ORDER — LEVOTHYROXINE SODIUM 112 MCG PO TABS: 112.0000 ug | ORAL_TABLET | Freq: Every day | ORAL | 1 refills | Status: DC

## 2016-10-08 NOTE — Telephone Encounter (Signed)
Ordered future labs as well as changed med dose

## 2016-10-08 NOTE — Telephone Encounter (Signed)
-----   Message from [name], PA-C sent at 10/08/2016  7:25 AM EST ----- Current thyroid dose is 125 mcg daily.  Decrease to 112 g daily. #30+1. Recheck TSH 6 weeks. Other lab normal.

## 2016-10-18 ENCOUNTER — Other Ambulatory Visit: Payer: Self-pay | Admitting: Student

## 2016-10-18 DIAGNOSIS — R102 Pelvic and perineal pain: Secondary | ICD-10-CM

## 2016-10-18 DIAGNOSIS — Z30011 Encounter for initial prescription of contraceptive pills: Secondary | ICD-10-CM

## 2016-10-19 ENCOUNTER — Telehealth: Payer: Self-pay

## 2016-10-19 NOTE — Telephone Encounter (Signed)
Pt lvm stating pharmacy never rec her Rx Lvmtcb to discuss which meds the pharmacy did not rec

## 2016-10-21 ENCOUNTER — Telehealth: Payer: Self-pay

## 2016-10-21 ENCOUNTER — Encounter: Payer: Self-pay | Admitting: Physician Assistant

## 2016-10-21 ENCOUNTER — Ambulatory Visit (INDEPENDENT_AMBULATORY_CARE_PROVIDER_SITE_OTHER): Payer: Medicare Other | Admitting: Physician Assistant

## 2016-10-21 VITALS — BP 120/86 | HR 76 | Temp 98.5°F | Resp 18 | Wt 244.0 lb

## 2016-10-21 DIAGNOSIS — Z0000 Encounter for general adult medical examination without abnormal findings: Secondary | ICD-10-CM | POA: Diagnosis not present

## 2016-10-21 DIAGNOSIS — Z23 Encounter for immunization: Secondary | ICD-10-CM | POA: Diagnosis not present

## 2016-10-21 DIAGNOSIS — I10 Essential (primary) hypertension: Secondary | ICD-10-CM | POA: Diagnosis not present

## 2016-10-21 DIAGNOSIS — R102 Pelvic and perineal pain: Secondary | ICD-10-CM

## 2016-10-21 DIAGNOSIS — Z30011 Encounter for initial prescription of contraceptive pills: Secondary | ICD-10-CM

## 2016-10-21 DIAGNOSIS — E079 Disorder of thyroid, unspecified: Secondary | ICD-10-CM

## 2016-10-21 DIAGNOSIS — Z803 Family history of malignant neoplasm of breast: Secondary | ICD-10-CM | POA: Insufficient documentation

## 2016-10-21 MED ORDER — NORETHINDRONE 0.35 MG PO TABS: 1.0000 | ORAL_TABLET | Freq: Every day | ORAL | 11 refills | Status: DC

## 2016-10-21 NOTE — Telephone Encounter (Signed)
Rx filled per protocol  

## 2016-10-21 NOTE — Progress Notes (Signed)
Patient ID: Janice Roberts MRN: [id number], DOB: January 18, 1976, 40 y.o. Date of Encounter: 10/21/2016,   Chief Complaint: Physical (CPE)  HPI: 40 y.o. y/o female  here for CPE.   She has no specific concerns to discuss today.  Reviewed her last office visit with me. At that visit she was wanting me to refill her birth control pills. At that visit we discussed that this had been started by GYN. Discussed that whoever prescribes the medicine needs to be doing pelvic exam etc. to monitor this. At that time she said that she did not want to follow-up with GYN and would return here to have pelvic exam etc. so that I could refill her birth control pills. At that visit I also discussed with her that it is recommended to use other forms of contraception as women get older. However she reported at that time-- and tells me again today-- that she has had an IUD in the past and "that did not work for her and can't do that again ". Today I also discussed her family history. Her mother had breast cancer. There is no other family history of any GYN cancer. Patient has no personal history of GYN cancers. There is no personal history or family history of any type of blood clots or clotting disorder. Patient does not smoke. Discussed that oral contraceptives can increase the risk of breast cancer and she also has family history of breast cancer. She voices understanding regarding the risk but wants to continue with the oral contraceptive.   Review of Systems: Consitutional: No fever, chills, fatigue, night sweats, lymphadenopathy. No significant/unexplained weight changes. Eyes: No visual changes, eye redness, or discharge. ENT/Mouth: No ear pain, sore throat, nasal drainage, or sinus pain. Cardiovascular: No chest pressure,heaviness, tightness or squeezing, even with exertion. No increased shortness of breath or dyspnea on exertion.No palpitations, edema, orthopnea, PND. Respiratory: No cough, hemoptysis, SOB,  or wheezing. Gastrointestinal: No anorexia, dysphagia, reflux, pain, nausea, vomiting, hematemesis, diarrhea, constipation, BRBPR, or melena. Breast: No mass, nodules, bulging, or retraction. No skin changes or inflammation. No nipple discharge. No lymphadenopathy. Genitourinary: No dysuria, hematuria, incontinence, vaginal discharge, pruritis, burning, abnormal bleeding, or pain. Musculoskeletal: No decreased ROM, No joint pain or swelling. No significant pain in neck, back, or extremities. Skin: No rash, pruritis, or concerning lesions. Neurological: No headache, dizziness, syncope, seizures, tremors, memory loss, coordination problems, or paresthesias. Psychological: She does have depression, and reports h/o hallucinations-- managed by Psych Endocrine: No polydipsia, polyphagia, polyuria, or known diabetes.No increased fatigue. No palpitations/rapid heart rate. No significant/unexplained weight change. All other systems were reviewed and are otherwise negative.  Past Medical History:  Diagnosis Date  . Anxiety   . Asthma   . Depression   . Hypertension   . Obesity   . Thyroid disease      Past Surgical History:  Procedure Laterality Date  . APPENDECTOMY    . CESAREAN SECTION    . [place] OF UTERUS  08/08/2008 & 03/08/2013  . THYROIDECTOMY      Home Meds:  Outpatient Medications Prior to Visit  Medication Sig Dispense Refill  . amitriptyline (ELAVIL) 25 MG tablet Take 1 tablet (25 mg total) by mouth at bedtime. 30 tablet 3  . benazepril (LOTENSIN) 10 MG tablet TAKE ONE TABLET BY MOUTH  DAILY 90 tablet 1  . cyclobenzaprine (FLEXERIL) 10 MG tablet Take 1 tablet (10 mg total) by mouth 3 (three) times daily as needed for muscle spasms. 30 tablet  0  . diclofenac (CATAFLAM) 50 MG tablet Take 1 tablet (50 mg total) by mouth 3 (three) times daily. 30 tablet 0  . levothyroxine (SYNTHROID, LEVOTHROID) 112 MCG tablet Take 1 tablet (112 mcg total) by mouth daily. 30 tablet 1  .  norethindrone (MICRONOR,CAMILA,ERRIN) 0.35 MG tablet Take 1 tablet (0.35 mg total) by mouth daily. 1 Package 11  . predniSONE (DELTASONE) 20 MG tablet 3 tabs po daily x 3 days, then 2 tabs x 3 days, then 1.5 tabs x 3 days, then 1 tab x 3 days, then 0.5 tabs x 3 days (Patient not taking: Reported on 10/21/2016) 27 tablet 0  . traMADol (ULTRAM) 50 MG tablet Take 1 tablet (50 mg total) by mouth every 6 (six) hours as needed. (Patient not taking: Reported on 10/21/2016) 15 tablet 0   No facility-administered medications prior to visit.     Allergies:  Allergies  Allergen Reactions  . Ivp Dye [Iodinated Diagnostic Agents] Anaphylaxis    "Almost died"  . Penicillins Hives    Has patient had a PCN reaction causing immediate rash, facial/tongue/throat swelling, SOB or lightheadedness with hypotension: No Has patient had a PCN reaction causing severe rash involving mucus membranes or skin necrosis: No Has patient had a PCN reaction that required hospitalization No Has patient had a PCN reaction occurring within the last 10 years: No If all of the above answers are "NO", then may proceed with Cephalosporin use.     Social History   Social History  . Marital status: Single    Spouse name: N/A  . Number of children: N/A  . Years of education: N/A   Occupational History  . Not on file.   Social History Main Topics  . Smoking status: Never Smoker  . Smokeless tobacco: Never Used  . Alcohol use No  . Drug use: No  . Sexual activity: Yes    Birth control/ protection: None   Other Topics Concern  . Not on file   Social History Narrative  . No narrative on file    Family History  Problem Relation Age of Onset  . Cancer Mother 77    Breast  . Asthma Sister   . Asthma Brother   . Asthma Sister   . Birth defects Sister     Physical Exam: Blood pressure 120/86, pulse 76, temperature 98.5 F (36.9 C), temperature source Oral, resp. rate 18, weight 244 lb (110.7 kg), last menstrual  period 10/04/2016, SpO2 98 %., Body mass index is 44.63 kg/m. General: Obese AAF. Appears in no acute distress. HEENT: Normocephalic, atraumatic. Conjunctiva pink, sclera non-icteric. Pupils 2 mm constricting to 1 mm, round, regular, and equally reactive to light and accomodation. EOMI. Internal auditory canal clear. TMs with good cone of light and without pathology. Nasal mucosa pink. Nares are without discharge. No sinus tenderness. Oral mucosa pink.  Pharynx without exudate.   Neck: Supple. Trachea midline. No thyromegaly. Full ROM. No lymphadenopathy.No Carotid Bruits. Lungs: Clear to auscultation bilaterally without wheezes, rales, or rhonchi. Breathing is of normal effort and unlabored. Cardiovascular: RRR with S1 S2. No murmurs, rubs, or gallops. Distal pulses 2+ symmetrically. No carotid or abdominal bruits. Breast: Symmetrical. No masses. Nipples without discharge. Abdomen: Soft, non-tender, non-distended with normoactive bowel sounds. No hepatosplenomegaly or masses. No rebound/guarding. No CVA tenderness. No hernias.  Genitourinary:  External genitalia without lesions. Vaginal mucosa pink.No discharge present. Cervix pink and without discharge. No cervical tenderness.Normal uterus size. No adnexal mass or tenderness.  Pap smear  taken. Musculoskeletal: Full range of motion and 5/5 strength throughout. Without swelling, atrophy, tenderness, crepitus, or warmth. Extremities without clubbing, cyanosis, or edema. Calves supple. Skin: Warm and moist without erythema, ecchymosis, wounds, or rash. Neuro: A+Ox3. CN II-XII grossly intact. Moves all extremities spontaneously. Full sensation throughout. Normal gait. DTR 2+ throughout upper and lower extremities.  Psych:  Responds to questions appropriately with a normal affect.   Assessment/Plan:  40 y.o. y/o female here for CPE 1. Encounter for preventive health examination  A. Screening Labs: No screening labs are due today. She had CBC checked  07/14/16 which was normal. She had TSH and BME T checked 10/07/16. She had lipid panel 02/18/16 which was good with LDL 97.  B. Pap: - PAP, Thin Prep w/HPV rflx HPV Type 16/18  C. Screening Mammogram: - MM Digital Screening; Future She is age 65. Given her family history with mother having breast cancer and patient using oral contraceptives will obtain screening mammogram.  D. DEXA/BMD:  N/A at this time  E. Colorectal Cancer Screening: No indication to require this until age 76.  F. Immunizations:  Influenza: Recommended flu vaccine but she defers. Tetanus:  She thinks that it has been greater than 10 years since her last tetanus vaccine. She is agreeable to update this today. T dap given here 10/21/2016 Pneumococcal: She has no indication to require this until age 48 Zostavax: Not indicated until age 67   Family history of breast cancer in mother    Marin Olp [place], [place], [facility] 10/21/2016 10:06 AM

## 2016-10-21 NOTE — Addendum Note (Signed)
Addended by: Vonna Kotyk A on: 10/21/2016 12:57 PM   Modules accepted: Orders

## 2016-10-26 LAB — PAP, THIN PREP W/HPV RFLX HPV TYPE 16/18: HPV DNA HIGH RISK: NOT DETECTED

## 2016-10-29 ENCOUNTER — Ambulatory Visit: Payer: Self-pay

## 2016-11-09 ENCOUNTER — Ambulatory Visit: Payer: Self-pay

## 2016-11-14 ENCOUNTER — Encounter (HOSPITAL_COMMUNITY): Payer: Self-pay

## 2016-11-14 DIAGNOSIS — N7011 Chronic salpingitis: Secondary | ICD-10-CM | POA: Diagnosis present

## 2016-11-14 DIAGNOSIS — Z3202 Encounter for pregnancy test, result negative: Secondary | ICD-10-CM | POA: Diagnosis present

## 2016-11-14 DIAGNOSIS — I10 Essential (primary) hypertension: Secondary | ICD-10-CM | POA: Diagnosis not present

## 2016-11-14 DIAGNOSIS — N730 Acute parametritis and pelvic cellulitis: Principal | ICD-10-CM | POA: Diagnosis present

## 2016-11-14 DIAGNOSIS — N800 Endometriosis of uterus: Secondary | ICD-10-CM | POA: Diagnosis present

## 2016-11-14 DIAGNOSIS — E079 Disorder of thyroid, unspecified: Secondary | ICD-10-CM | POA: Diagnosis present

## 2016-11-14 DIAGNOSIS — G8929 Other chronic pain: Secondary | ICD-10-CM | POA: Diagnosis not present

## 2016-11-14 DIAGNOSIS — R102 Pelvic and perineal pain: Secondary | ICD-10-CM | POA: Diagnosis not present

## 2016-11-14 LAB — COMPREHENSIVE METABOLIC PANEL
ALT: 18 U/L (ref 14–54)
ANION GAP: 10 (ref 5–15)
AST: 18 U/L (ref 15–41)
Albumin: 3.8 g/dL (ref 3.5–5.0)
Alkaline Phosphatase: 63 U/L (ref 38–126)
BILIRUBIN TOTAL: 0.6 mg/dL (ref 0.3–1.2)
BUN: 9 mg/dL (ref 6–20)
CHLORIDE: 105 mmol/L (ref 101–111)
CO2: 22 mmol/L (ref 22–32)
Calcium: 9.4 mg/dL (ref 8.9–10.3)
Creatinine, Ser: 0.81 mg/dL (ref 0.44–1.00)
GFR calc Af Amer: >60 mL/min (ref >60)
Glucose, Bld: 121 mg/dL — ABNORMAL HIGH (ref 65–99)
Potassium: 3.6 mmol/L (ref 3.5–5.1)
Sodium: 137 mmol/L (ref 135–145)
TOTAL PROTEIN: 7.8 g/dL (ref 6.5–8.1)

## 2016-11-14 LAB — URINALYSIS, ROUTINE W REFLEX MICROSCOPIC
BILIRUBIN URINE: NEGATIVE
Glucose, UA: NEGATIVE mg/dL
HGB URINE DIPSTICK: NEGATIVE
KETONES UR: NEGATIVE mg/dL
LEUKOCYTES UA: NEGATIVE
NITRITE: NEGATIVE
PROTEIN: 30 mg/dL — AB
Specific Gravity, Urine: 1.016 (ref 1.005–1.030)
pH: 7 (ref 5.0–8.0)

## 2016-11-14 LAB — CBC
HEMATOCRIT: 39.1 % (ref 36.0–46.0)
HEMOGLOBIN: 13.4 g/dL (ref 12.0–15.0)
MCH: 28.8 pg (ref 26.0–34.0)
MCHC: 34.3 g/dL (ref 30.0–36.0)
MCV: 83.9 fL (ref 78.0–100.0)
Platelets: 280 10*3/uL (ref 150–400)
RBC: 4.66 MIL/uL (ref 3.87–5.11)
RDW: 13.9 % (ref 11.5–15.5)
WBC: 9.9 10*3/uL (ref 4.0–10.5)

## 2016-11-14 LAB — LIPASE, BLOOD: LIPASE: 19 U/L (ref 11–51)

## 2016-11-14 MED ORDER — FENTANYL CITRATE (PF) 100 MCG/2ML IJ SOLN
INTRAMUSCULAR | Status: AC
  Filled 2016-11-14: qty 2

## 2016-11-14 MED ORDER — FENTANYL CITRATE (PF) 100 MCG/2ML IJ SOLN
50.0000 ug | INTRAMUSCULAR | Status: DC | PRN
  Administered 2016-11-14: 50 ug via NASAL

## 2016-11-14 NOTE — ED Triage Notes (Signed)
Pt complaining of lower abdominal pain and pressure since this evening. Pt denies any urinary complaints. Pt denies any N/V/D.

## 2016-11-15 ENCOUNTER — Inpatient Hospital Stay (HOSPITAL_COMMUNITY)
Admission: EM | Admit: 2016-11-15 | Discharge: 2016-11-17 | DRG: 759 | Disposition: A | Discharge status: Home / Self Care | Payer: Medicare Other | Attending: Obstetrics and Gynecology | Admitting: Obstetrics and Gynecology

## 2016-11-15 ENCOUNTER — Emergency Department (HOSPITAL_COMMUNITY): Payer: Medicare Other

## 2016-11-15 DIAGNOSIS — R102 Pelvic and perineal pain: Secondary | ICD-10-CM | POA: Diagnosis not present

## 2016-11-15 DIAGNOSIS — N730 Acute parametritis and pelvic cellulitis: Secondary | ICD-10-CM

## 2016-11-15 DIAGNOSIS — N7011 Chronic salpingitis: Secondary | ICD-10-CM | POA: Diagnosis not present

## 2016-11-15 DIAGNOSIS — R188 Other ascites: Secondary | ICD-10-CM

## 2016-11-15 LAB — I-STAT BETA HCG BLOOD, ED (MC, WL, AP ONLY)

## 2016-11-15 LAB — PREGNANCY, URINE: PREG TEST UR: NEGATIVE

## 2016-11-15 LAB — WET PREP, GENITAL
Sperm: NONE SEEN
TRICH WET PREP: NONE SEEN
WBC, Wet Prep HPF POC: NONE SEEN
YEAST WET PREP: NONE SEEN

## 2016-11-15 LAB — SEDIMENTATION RATE: SED RATE: 50 mm/h — AB (ref 0–22)

## 2016-11-15 LAB — GC/CHLAMYDIA PROBE AMP (~~LOC~~) NOT AT ARMC
CHLAMYDIA, DNA PROBE: NEGATIVE
Neisseria Gonorrhea: NEGATIVE

## 2016-11-15 MED ORDER — PRENATAL MULTIVITAMIN CH: 1.0000 | ORAL_TABLET | Freq: Every day | ORAL | Status: DC

## 2016-11-15 MED ORDER — PROMETHAZINE HCL 25 MG PO TABS: 25.0000 mg | ORAL_TABLET | Freq: Four times a day (QID) | ORAL | Status: DC | PRN

## 2016-11-15 MED ORDER — DEXTROSE 5 % IV SOLN
2.0000 g | Freq: Two times a day (BID) | INTRAVENOUS | Status: DC
  Administered 2016-11-15 – 2016-11-17 (×6): 2 g via INTRAVENOUS
  Filled 2016-11-15 (×7): qty 2

## 2016-11-15 MED ORDER — HYDROMORPHONE HCL 2 MG/ML IJ SOLN
1.0000 mg | Freq: Once | INTRAMUSCULAR | Status: AC
  Administered 2016-11-15: 1 mg via INTRAMUSCULAR
  Filled 2016-11-15: qty 1

## 2016-11-15 MED ORDER — SODIUM CHLORIDE 0.9 % IV SOLN
INTRAVENOUS | Status: DC
Start: 2016-11-15 — End: 2016-11-15
  Administered 2016-11-15: 04:00:00 via INTRAVENOUS

## 2016-11-15 MED ORDER — PROMETHAZINE HCL 25 MG/ML IJ SOLN
12.5000 mg | INTRAMUSCULAR | Status: DC | PRN
  Administered 2016-11-15 – 2016-11-17 (×4): 12.5 mg via INTRAVENOUS
  Filled 2016-11-15 (×4): qty 1

## 2016-11-15 MED ORDER — SODIUM CHLORIDE 0.9 % IV SOLN
INTRAVENOUS | Status: DC
  Administered 2016-11-15 – 2016-11-16 (×2): via INTRAVENOUS

## 2016-11-15 MED ORDER — ENOXAPARIN SODIUM 60 MG/0.6ML ~~LOC~~ SOLN
50.0000 mg | Freq: Every day | SUBCUTANEOUS | Status: DC
  Administered 2016-11-15 – 2016-11-17 (×3): 50 mg via SUBCUTANEOUS
  Filled 2016-11-15 (×3): qty 0.6

## 2016-11-15 MED ORDER — DOXYCYCLINE HYCLATE 100 MG PO TABS
100.0000 mg | ORAL_TABLET | Freq: Two times a day (BID) | ORAL | Status: DC
  Administered 2016-11-15 – 2016-11-17 (×5): 100 mg via ORAL
  Filled 2016-11-15 (×5): qty 1

## 2016-11-15 MED ORDER — HYDROMORPHONE HCL 2 MG/ML IJ SOLN: 1.0000 mg | Freq: Once | INTRAMUSCULAR | Status: DC

## 2016-11-15 MED ORDER — OXYCODONE-ACETAMINOPHEN 5-325 MG PO TABS
1.0000 | ORAL_TABLET | ORAL | Status: DC | PRN
Start: 2016-11-15 — End: 2016-11-17
  Administered 2016-11-15 – 2016-11-17 (×5): 2 via ORAL
  Filled 2016-11-15 (×5): qty 2

## 2016-11-15 MED ORDER — POLYETHYLENE GLYCOL 3350 17 G PO PACK: 17.0000 g | PACK | Freq: Every day | ORAL | Status: DC | PRN

## 2016-11-15 MED ORDER — ENOXAPARIN SODIUM 40 MG/0.4ML ~~LOC~~ SOLN: 40.0000 mg | SUBCUTANEOUS | Status: DC

## 2016-11-15 MED ORDER — HYDROMORPHONE HCL 1 MG/ML IJ SOLN
1.0000 mg | INTRAMUSCULAR | Status: DC | PRN
  Administered 2016-11-15: 1 mg via INTRAVENOUS
  Filled 2016-11-15: qty 1

## 2016-11-15 MED ORDER — KETOROLAC TROMETHAMINE 30 MG/ML IJ SOLN
30.0000 mg | Freq: Four times a day (QID) | INTRAMUSCULAR | Status: AC
  Administered 2016-11-15 – 2016-11-16 (×3): 30 mg via INTRAVENOUS
  Filled 2016-11-15 (×3): qty 1

## 2016-11-15 MED ORDER — HYDROMORPHONE HCL 2 MG/ML IJ SOLN
1.0000 mg | Freq: Once | INTRAMUSCULAR | Status: AC
  Administered 2016-11-15: 1 mg via INTRAVENOUS
  Filled 2016-11-15: qty 1

## 2016-11-15 NOTE — ED Notes (Signed)
Taken to US at this time. 

## 2016-11-15 NOTE — Progress Notes (Signed)
Received patient from [place]. Patient asleep, easily arose and oriented x 4. Pt reported that her  abdominal pain is  10/10. She also stated that although she has received a total of 2 mg of Dilaudid IV at [facility] " the pain is still the same. It only puts me to sleep". Pt was oriented  to room, call bell in close reach and settled safely in bed. Dr Glo Herring was called and made aware of her arrival.

## 2016-11-15 NOTE — H&P (Signed)
Janice Roberts is an 41 y.o. female. She is admitted for Acute exacerbation of chronic pelvic pain, which occurs around her menses x 2 yrs, perceived as severely worse since last pm. Her LMP Patient's last menstrual period was 10/14/2016. She is on progesterone only ocp's. She is transferred from [facility] ED where she presented with severe pelvic pain 10/10 and pressure, normal bowel function, afebrile, and WBC normal. Pelvic u/s shows the recent development of a right hydrosalpinx with internal debris, which was not present on pelvic u/s 10/2015 Pt is quite dramatic in pain complaints, but has not seen a gyn since the 10/2015 visit with Dr Roselie Awkward. Hospitalization will likely be brief with followup arrangement with [facility] gyn services for long term management plan Will observe for 12-24 hours of iv antitibiotics.  Pertinent Gynecological History: Menses: flow is moderate and regular every 28 days without intermenstrual spotting Bleeding:  Contraception: oral progesterone-only contraceptive DES exposure: unknown Blood transfusions: none Sexually transmitted diseases: no past history and pending GC/Chl from ED Previous GYN Procedures: multiple Elective Ab and spont AB, most recently 2 yr ago  Last mammogram:  Date:  Last pap: normal Date: 10/27/16 OB History: G10, P2   Menstrual History: Menarche age:  Patient's last menstrual period was 10/14/2016.    Past Medical History:  Diagnosis Date  . Anxiety   . Asthma   . Depression   . Hypertension   . Obesity   . Thyroid disease     Past Surgical History:  Procedure Laterality Date  . APPENDECTOMY    . CESAREAN SECTION    . [place] OF UTERUS  08/08/2008 & 03/08/2013  . THYROIDECTOMY      Family History  Problem Relation Age of Onset  . Cancer Mother 36    Breast  . Asthma Sister   . Asthma Brother   . Asthma Sister   . Birth defects Sister     Social History:  reports that she has never smoked. She has never used  smokeless tobacco. She reports that she does not drink alcohol or use drugs.  Allergies:  Allergies  Allergen Reactions  . Ivp Dye [Iodinated Diagnostic Agents] Anaphylaxis    "Almost died"  . Penicillins Hives    Has patient had a PCN reaction causing immediate rash, facial/tongue/throat swelling, SOB or lightheadedness with hypotension: No Has patient had a PCN reaction causing severe rash involving mucus membranes or skin necrosis: No Has patient had a PCN reaction that required hospitalization No Has patient had a PCN reaction occurring within the last 10 years: No If all of the above answers are "NO", then may proceed with Cephalosporin use.     Prescriptions Prior to Admission  Medication Sig Dispense Refill Last Dose  . amitriptyline (ELAVIL) 25 MG tablet Take 1 tablet (25 mg total) by mouth at bedtime. (Patient taking differently: Take 25 mg by mouth at bedtime as needed for sleep. ) 30 tablet 3 unk  . benazepril (LOTENSIN) 10 MG tablet TAKE ONE TABLET BY MOUTH  DAILY 90 tablet 1 11/14/2016 at Unknown time  . cyclobenzaprine (FLEXERIL) 10 MG tablet Take 1 tablet (10 mg total) by mouth 3 (three) times daily as needed for muscle spasms. 30 tablet 0 unk  . levothyroxine (SYNTHROID, LEVOTHROID) 112 MCG tablet Take 1 tablet (112 mcg total) by mouth daily. (Patient taking differently: Take 125 mcg by mouth daily. ) 30 tablet 1 11/14/2016 at Unknown time  . norethindrone (MICRONOR,CAMILA,ERRIN) 0.35 MG tablet Take 1 tablet (  0.35 mg total) by mouth daily. 1 Package 11 11/14/2016 at Unknown time    ROS  Blood pressure 139/90, pulse 86, temperature 97.7 F (36.5 C), temperature source Oral, resp. rate 18, height '5\' 2"'$  (1.575 m), weight 102.1 kg (225 lb), last menstrual period 10/14/2016, SpO2 96 %. Physical Exam  Constitutional: She is oriented to person, place, and time. She appears well-developed and well-nourished.  HENT:  Head: Normocephalic.  Eyes: Pupils are equal, round, and reactive  to light.  Neck: Normal range of motion.  Cardiovascular: Normal rate.   Respiratory: Effort normal.  GI: Soft. There is tenderness. There is guarding. There is no rebound.  Obese with surgical rlq scar from old appendectomy  Genitourinary:  Genitourinary Comments: Difficult exam, pt too uncomfortable to tolerate well, done by ED  Physician   Neurological: She is alert and oriented to person, place, and time. She has normal reflexes.    Results for orders placed or performed during the hospital encounter of 11/15/16 (from the past 24 hour(s))  Lipase, blood     Status: None   Collection Time: 11/14/16 10:30 PM  Result Value Ref Range   Lipase 19 11 - 51 U/L  Comprehensive metabolic panel     Status: Abnormal   Collection Time: 11/14/16 10:30 PM  Result Value Ref Range   Sodium 137 135 - 145 mmol/L   Potassium 3.6 3.5 - 5.1 mmol/L   Chloride 105 101 - 111 mmol/L   CO2 22 22 - 32 mmol/L   Glucose, Bld 121 (H) 65 - 99 mg/dL   BUN 9 6 - 20 mg/dL   Creatinine, Ser 0.81 0.44 - 1.00 mg/dL   Calcium 9.4 8.9 - 10.3 mg/dL   Total Protein 7.8 6.5 - 8.1 g/dL   Albumin 3.8 3.5 - 5.0 g/dL   AST 18 15 - 41 U/L   ALT 18 14 - 54 U/L   Alkaline Phosphatase 63 38 - 126 U/L   Total Bilirubin 0.6 0.3 - 1.2 mg/dL   GFR calc non Af Amer >60 >60 mL/min   GFR calc Af Amer >60 >60 mL/min   Anion gap 10 5 - 15  CBC     Status: None   Collection Time: 11/14/16 10:30 PM  Result Value Ref Range   WBC 9.9 4.0 - 10.5 K/uL   RBC 4.66 3.87 - 5.11 MIL/uL   Hemoglobin 13.4 12.0 - 15.0 g/dL   HCT 39.1 36.0 - 46.0 %   MCV 83.9 78.0 - 100.0 fL   MCH 28.8 26.0 - 34.0 pg   MCHC 34.3 30.0 - 36.0 g/dL   RDW 13.9 11.5 - 15.5 %   Platelets 280 150 - 400 K/uL  Urinalysis, Routine w reflex microscopic     Status: Abnormal   Collection Time: 11/14/16 10:41 PM  Result Value Ref Range   Color, Urine YELLOW YELLOW   APPearance CLEAR CLEAR   Specific Gravity, Urine 1.016 1.005 - 1.030   pH 7.0 5.0 - 8.0    Glucose, UA NEGATIVE NEGATIVE mg/dL   Hgb urine dipstick NEGATIVE NEGATIVE   Bilirubin Urine NEGATIVE NEGATIVE   Ketones, ur NEGATIVE NEGATIVE mg/dL   Protein, ur 30 (A) NEGATIVE mg/dL   Nitrite NEGATIVE NEGATIVE   Leukocytes, UA NEGATIVE NEGATIVE   RBC / HPF 0-5 0 - 5 RBC/hpf   WBC, UA 0-5 0 - 5 WBC/hpf   Bacteria, UA RARE (A) NONE SEEN   Squamous Epithelial / LPF 0-5 (A) NONE SEEN  Mucous PRESENT   Pregnancy, urine     Status: None   Collection Time: 11/14/16 10:41 PM  Result Value Ref Range   Preg Test, Ur NEGATIVE NEGATIVE  Wet prep, genital     Status: Abnormal   Collection Time: 11/15/16  1:31 AM  Result Value Ref Range   Yeast Wet Prep HPF POC NONE SEEN NONE SEEN   Trich, Wet Prep NONE SEEN NONE SEEN   Clue Cells Wet Prep HPF POC PRESENT (A) NONE SEEN   WBC, Wet Prep HPF POC NONE SEEN NONE SEEN   Sperm NONE SEEN   I-Stat Beta hCG blood, ED ([facility], [facility], [facility] only)     Status: None   Collection Time: 11/15/16  1:49 AM  Result Value Ref Range   I-stat hCG, quantitative <5.0 <5 mIU/mL   Comment 3            US Transvaginal Non-ob  Result Date: 11/15/2016 CLINICAL DATA:  Constant moderate pelvic pain starting today. EXAM: TRANSABDOMINAL AND TRANSVAGINAL ULTRASOUND OF PELVIS DOPPLER ULTRASOUND OF OVARIES TECHNIQUE: Both transabdominal and transvaginal ultrasound examinations of the pelvis were performed. Transabdominal technique was performed for global imaging of the pelvis including uterus, ovaries, adnexal regions, and pelvic cul-de-sac. It was necessary to proceed with endovaginal exam following the transabdominal exam to visualize the uterus and ovaries. Color and duplex Doppler ultrasound was utilized to evaluate blood flow to the ovaries. COMPARISON:  10/22/2015 FINDINGS: Uterus Measurements: 7 x 5.6 x 5.8 cm. Uterus is retroverted. Uterine myometrium is heterogeneous and somewhat nodular in appearance. Findings could indicate myometrial fibroids and/ or adenomyosis. Similar  appearance to previous study. Endometrium Thickness: 4 mm.  No focal abnormality visualized. Right ovary Right ovary is not visualized. There is a loculated appearing complex fluid collection in the right adnexal region. This could represent hematoma or infection. Cystic neoplasm less likely. Left ovary Measurements: 2.3 x 1.8 x 1.9 cm. Normal follicular changes in the left ovary. Complex tubular structure demonstrated in the left adnexum adjacent to the ovary. This measures about 3.5 x 1 x 1.6 cm. This may represent hydrosalpinx or salpingitis. Pulsed Doppler evaluation of the left ovary demonstrates normal low-resistance arterial and venous waveforms. Other findings Small amount of free fluid in the pelvis. IMPRESSION: Retroverted uterus with heterogeneous appearance of myometrium possibly indicating fibroids and/or adenomyosis. Right ovary not visualized. Loculated appearing complex fluid collection in the right adnexal region. This could represent hematoma or infection. Left ovary appears normal. Complex tubular structure adjacent to the left ovary may represent hydrosalpinx or salpingitis. Electronically Signed   By: Burman Nieves M.D.   On: 11/15/2016 02:44   US Pelvis Complete  Result Date: 11/15/2016 CLINICAL DATA:  Constant moderate pelvic pain starting today. EXAM: TRANSABDOMINAL AND TRANSVAGINAL ULTRASOUND OF PELVIS DOPPLER ULTRASOUND OF OVARIES TECHNIQUE: Both transabdominal and transvaginal ultrasound examinations of the pelvis were performed. Transabdominal technique was performed for global imaging of the pelvis including uterus, ovaries, adnexal regions, and pelvic cul-de-sac. It was necessary to proceed with endovaginal exam following the transabdominal exam to visualize the uterus and ovaries. Color and duplex Doppler ultrasound was utilized to evaluate blood flow to the ovaries. COMPARISON:  10/22/2015 FINDINGS: Uterus Measurements: 7 x 5.6 x 5.8 cm. Uterus is retroverted. Uterine myometrium  is heterogeneous and somewhat nodular in appearance. Findings could indicate myometrial fibroids and/ or adenomyosis. Similar appearance to previous study. Endometrium Thickness: 4 mm.  No focal abnormality visualized. Right ovary Right ovary is not visualized. There is a  loculated appearing complex fluid collection in the right adnexal region. This could represent hematoma or infection. Cystic neoplasm less likely. Left ovary Measurements: 2.3 x 1.8 x 1.9 cm. Normal follicular changes in the left ovary. Complex tubular structure demonstrated in the left adnexum adjacent to the ovary. This measures about 3.5 x 1 x 1.6 cm. This may represent hydrosalpinx or salpingitis. Pulsed Doppler evaluation of the left ovary demonstrates normal low-resistance arterial and venous waveforms. Other findings Small amount of free fluid in the pelvis. IMPRESSION: Retroverted uterus with heterogeneous appearance of myometrium possibly indicating fibroids and/or adenomyosis. Right ovary not visualized. Loculated appearing complex fluid collection in the right adnexal region. This could represent hematoma or infection. Left ovary appears normal. Complex tubular structure adjacent to the left ovary may represent hydrosalpinx or salpingitis. Electronically Signed   By: Lucienne Capers M.D.   On: 11/15/2016 02:44   [place] Art/ven Flow Abd Pelv Doppler  Result Date: 11/15/2016 CLINICAL DATA:  Constant moderate pelvic pain starting today. EXAM: TRANSABDOMINAL AND TRANSVAGINAL ULTRASOUND OF PELVIS DOPPLER ULTRASOUND OF OVARIES TECHNIQUE: Both transabdominal and transvaginal ultrasound examinations of the pelvis were performed. Transabdominal technique was performed for global imaging of the pelvis including uterus, ovaries, adnexal regions, and pelvic cul-de-sac. It was necessary to proceed with endovaginal exam following the transabdominal exam to visualize the uterus and ovaries. Color and duplex Doppler ultrasound was utilized to evaluate  blood flow to the ovaries. COMPARISON:  10/22/2015 FINDINGS: Uterus Measurements: 7 x 5.6 x 5.8 cm. Uterus is retroverted. Uterine myometrium is heterogeneous and somewhat nodular in appearance. Findings could indicate myometrial fibroids and/ or adenomyosis. Similar appearance to previous study. Endometrium Thickness: 4 mm.  No focal abnormality visualized. Right ovary Right ovary is not visualized. There is a loculated appearing complex fluid collection in the right adnexal region. This could represent hematoma or infection. Cystic neoplasm less likely. Left ovary Measurements: 2.3 x 1.8 x 1.9 cm. Normal follicular changes in the left ovary. Complex tubular structure demonstrated in the left adnexum adjacent to the ovary. This measures about 3.5 x 1 x 1.6 cm. This may represent hydrosalpinx or salpingitis. Pulsed Doppler evaluation of the left ovary demonstrates normal low-resistance arterial and venous waveforms. Other findings Small amount of free fluid in the pelvis. IMPRESSION: Retroverted uterus with heterogeneous appearance of myometrium possibly indicating fibroids and/or adenomyosis. Right ovary not visualized. Loculated appearing complex fluid collection in the right adnexal region. This could represent hematoma or infection. Left ovary appears normal. Complex tubular structure adjacent to the left ovary may represent hydrosalpinx or salpingitis. Electronically Signed   By: Lucienne Capers M.D.   On: 11/15/2016 02:44    Assessment/Plan: 1. Acute on Chronic pelvic pain , will treat as PID 2. New dx of right hydrosalpinx 3 ? Adenomyosis (heterogenous uterine tissues)  Plan BEgin iv antibiotics, assess pain needs        ESR ordered. Consider longer IV antibiotics if elevated         Short interval inpatient observation, will d/c on doxycycline + flagyl or equivalent regimen,  Felisia Balcom V 11/15/2016, 6:39 AM

## 2016-11-15 NOTE — ED Provider Notes (Signed)
By signing my name below, I, Janice Roberts, attest that this documentation has been prepared under the direction and in the presence of [place], DO . Electronically Signed: Jeanell Roberts, Scribe. 11/15/2016. 1:20 AM.  TIME SEEN: 1:20 AM  CHIEF COMPLAINT: Abdominal pain  HPI:  HPI Comments: Janice Roberts is a 41 y.o. female who presents to the Emergency Department complaining of constant moderate lower abdominal pain that started today. She has a prior hx of similar complaint (unknown cause after multiple evaluations) intermittently over the past 2 years which has worsened today. She describes the pain as a pressure sensation that is exacerbated by sitting. She reports associated chills, lightheadedness, dyspareunia, pain with bowel movements, and menstrual cycle with abnormal heavy bleeding. She admits to currently being sexually active with one partner, [id number]. She has a hx of c-section and appendectomy. She denies any hx of STDs, nausea, vomiting, diarrhea, blood in stool, melena, dysuria, vaginal bleeding/discharge, or other complaints. She is currently on birth control pills to help with the symptoms.  LMP 10/14/16.    PCP: DIXON,MARY BETH, PA-C   ROS: See HPI Constitutional: +chills, no fever  Eyes: no drainage  ENT: no runny nose   Cardiovascular:  no chest pain  Resp: no SOB  GI: no vomiting, no nausea, no diarrhea, blood in stool GU: no dysuria, no vaginal bleeding/discharge Integumentary: no rash  Allergy: no hives  Musculoskeletal: no leg swelling  Neurological: +lightheadedness, no slurred speech ROS otherwise negative  PAST MEDICAL HISTORY/PAST SURGICAL HISTORY:  Past Medical History:  Diagnosis Date  . Anxiety   . Asthma   . Depression   . Hypertension   . Obesity   . Thyroid disease     MEDICATIONS:  Prior to Admission medications   Medication Sig Start Date End Date Taking? Authorizing Provider  amitriptyline (ELAVIL) 25 MG tablet Take 1 tablet (25  mg total) by mouth at bedtime. 01/05/16   Woodroe Mode, MD  benazepril (LOTENSIN) 10 MG tablet TAKE ONE TABLET BY MOUTH  DAILY 06/21/16   [name], PA-C  cyclobenzaprine (FLEXERIL) 10 MG tablet Take 1 tablet (10 mg total) by mouth 3 (three) times daily as needed for muscle spasms. 10/07/16   Lonie Peak Dixon, PA-C  diclofenac (CATAFLAM) 50 MG tablet Take 1 tablet (50 mg total) by mouth 3 (three) times daily. 07/06/16   Billy Fischer, MD  levothyroxine (SYNTHROID, LEVOTHROID) 112 MCG tablet Take 1 tablet (112 mcg total) by mouth daily. 10/08/16   Lonie Peak Dixon, PA-C  norethindrone (MICRONOR,CAMILA,ERRIN) 0.35 MG tablet Take 1 tablet (0.35 mg total) by mouth daily. 10/21/16   Lonie Peak Dixon, PA-C  predniSONE (DELTASONE) 20 MG tablet 3 tabs po daily x 3 days, then 2 tabs x 3 days, then 1.5 tabs x 3 days, then 1 tab x 3 days, then 0.5 tabs x 3 days Patient not taking: Reported on 10/21/2016 07/14/16   Orpah Greek, MD  traMADol (ULTRAM) 50 MG tablet Take 1 tablet (50 mg total) by mouth every 6 (six) hours as needed. Patient not taking: Reported on 10/21/2016 07/14/16   Orpah Greek, MD    ALLERGIES:  Allergies  Allergen Reactions  . Ivp Dye [Iodinated Diagnostic Agents] Anaphylaxis    "Almost died"  . Penicillins Hives    Has patient had a PCN reaction causing immediate rash, facial/tongue/throat swelling, SOB or lightheadedness with hypotension: No Has patient had a PCN reaction causing severe rash involving mucus membranes or skin  necrosis: No Has patient had a PCN reaction that required hospitalization No Has patient had a PCN reaction occurring within the last 10 years: No If all of the above answers are "NO", then may proceed with Cephalosporin use.     SOCIAL HISTORY:  Social History  Substance Use Topics  . Smoking status: Never Smoker  . Smokeless tobacco: Never Used  . Alcohol use No    FAMILY HISTORY: Family History  Problem Relation Age of Onset  . Cancer Mother  28    Breast  . Asthma Sister   . Asthma Brother   . Asthma Sister   . Birth defects Sister     EXAM: BP (!) 150/101 (BP Location: Right Arm)   Pulse 120   Temp 98.6 F (37 C) (Oral)   Resp 18   Ht 5\' 2"  (1.575 m)   Wt 225 lb (102.1 kg)   LMP 10/14/2016   SpO2 98%   BMI 41.15 kg/m  CONSTITUTIONAL: Alert and oriented and responds appropriately to questions. Well-appearing; well-nourished, appears very uncomfortable, afebrile HEAD: Normocephalic EYES: Conjunctivae clear, PERRL, EOMI ENT: normal nose; no rhinorrhea; moist mucous membranes NECK: Supple, no meningismus, no nuchal rigidity, no LAD  CARD: RRR; S1 and S2 appreciated; no murmurs, no clicks, no rubs, no gallops RESP: Normal chest excursion without splinting or tachypnea; breath sounds clear and equal bilaterally; no wheezes, no rhonchi, no rales, no hypoxia or respiratory distress, speaking full sentences ABD/GI: Normal bowel sounds; non-distended; soft, tender to palpation throughout the lower abdomen and pushes my hand away during evaluation, no rebound, no guarding, no peritoneal signs, no hepatosplenomegaly GU:  Normal external genitalia. No lesions, rashes noted. Patient has no vaginal bleeding on exam. No significant vaginal discharge. Patient does have diffuse tenderness with palpation of her adnexa or cervix without mass, fullness. Cervix is not appear friable.  Cervix is closed.  Chaperone present for exam. BACK:  The back appears normal and is non-tender to palpation, there is no CVA tenderness EXT: Normal ROM in all joints; non-tender to palpation; no edema; normal capillary refill; no cyanosis, no calf tenderness or swelling    SKIN: Normal color for age and race; warm; no rash NEURO: Moves all extremities equally, sensation to light touch intact diffusely, cranial nerves II through XII intact, normal speech PSYCH: The patient's mood and manner are appropriate. Grooming and personal hygiene are  appropriate.   MEDICAL DECISION MAKING: Patient here with intermittent pelvic pain. Labs ordered in triage of been unremarkable. Urine shows no sign of infection. Pregnancy test negative.  Suspect that this could be endometriosis. Low suspicion for acute abnormality such as torsion, TOA, PID given chronicity of symptoms but will obtain transvaginal ultrasound and send pelvic cultures. We'll give Dilaudid for pain. She reports she does have an outpatient OB/GYN. Doubt appendicitis, colitis, bowel obstruction, diverticulitis.  ED PROGRESS: Patient's ultrasound shows heterogeneous uterus consistent with fibroids versus adenomyosis is similar compared to previous study in December 2016.  Right ovary not visualized on this ultrasound. There is a loculated appearing complex fluid collection in the right adnexal region that could represent hematoma versus infection. Left ovary appears normal with normal blood flow. There is a complex tubular structure adjacent to the left ovary that may represent hydrosalpinx versus salpingitis. Patient's OB/GYN is Dr. Roselie Awkward. Discussed with Dr. Glo Herring who is on call for Dr. Roselie Awkward. Given we have had a hard time controlling patient's pain and she is still on 8/10 and has significant pain with minimal  movement, we both agree patient would benefit from admission to [facility] hospital for pain control and for IV antibiotics. He recommends giving 2 g of IV Cefotan and oral doxycycline. Updated patient with this plan and she agrees. [facility]   I reviewed all nursing notes, vitals, pertinent old records, EKGs, labs, imaging (as available).     I personally performed the services described in this documentation, which was scribed in my presence. The recorded information has been reviewed and is accurate.     [place], DO 11/15/16 [phone]

## 2016-11-16 DIAGNOSIS — N7011 Chronic salpingitis: Secondary | ICD-10-CM

## 2016-11-16 DIAGNOSIS — G8929 Other chronic pain: Secondary | ICD-10-CM | POA: Diagnosis not present

## 2016-11-16 DIAGNOSIS — I10 Essential (primary) hypertension: Secondary | ICD-10-CM | POA: Diagnosis present

## 2016-11-16 DIAGNOSIS — N730 Acute parametritis and pelvic cellulitis: Secondary | ICD-10-CM | POA: Diagnosis present

## 2016-11-16 DIAGNOSIS — Z3202 Encounter for pregnancy test, result negative: Secondary | ICD-10-CM | POA: Diagnosis present

## 2016-11-16 DIAGNOSIS — N800 Endometriosis of uterus: Secondary | ICD-10-CM | POA: Diagnosis present

## 2016-11-16 DIAGNOSIS — E079 Disorder of thyroid, unspecified: Secondary | ICD-10-CM | POA: Diagnosis present

## 2016-11-16 NOTE — Progress Notes (Signed)
Subjective: Patient reports continued deep pelvic pain/pressure - worse after urinating. Improved with pain medicine. She denies fevers, chills. Has continued decreased appetite. ESR elevated to 50.   Objective: I have reviewed patient's vital signs, intake and output, medications and labs.  General: alert, cooperative and no distress Resp: clear to auscultation bilaterally Cardio: regular rate and rhythm, S1, S2 normal, no murmur, click, rub or gallop GI: Soft, tender in lower quadrants R>L. No rebound or guarding.  Extremities: Homans sign is negative, no sign of DVT and no edema, redness or tenderness in the calves or thighs   Assessment/Plan: Hydrosalpinx vs pyosalpinx Possible Adenomyosis  ESR elevated to 50 - will repeat today.  Continue Cefotetan and doxy for 24 more hours  ? D/c tomorrow.    LOS: 1 day    Janice Roberts 11/16/2016, 7:54 AM

## 2016-11-17 DIAGNOSIS — N730 Acute parametritis and pelvic cellulitis: Secondary | ICD-10-CM

## 2016-11-17 MED ORDER — ONDANSETRON 4 MG PO TBDP
4.0000 mg | ORAL_TABLET | Freq: Four times a day (QID) | ORAL | 1 refills | Status: DC | PRN
Start: 2016-11-17 — End: 2017-01-24

## 2016-11-17 MED ORDER — DOXYCYCLINE HYCLATE 100 MG PO TABS: 100.0000 mg | ORAL_TABLET | Freq: Two times a day (BID) | ORAL | 0 refills | Status: DC

## 2016-11-17 NOTE — Progress Notes (Signed)
Pt discharged home with family. Discharge instructions, paperwork, follow-up appts, and prescriptions reviewed. No questions at this time.

## 2016-11-17 NOTE — Discharge Summary (Signed)
Physician Discharge Summary  Patient ID: Janice Roberts MRN: [id number] DOB/AGE: [birth year] 41 y.o.  Admit date: 11/15/2016 Discharge date: 11/17/2016  Admission Diagnoses:PID, pelvic pain  Discharge Diagnoses: same Active Problems:   Pelvic pain   PID (acute pelvic inflammatory disease)   Discharged Condition: good  Hospital Course: Janice Roberts is an 41 y.o. female. She is admitted for Acute exacerbation of chronic pelvic pain, which occurs around her menses x 2 yrs, perceived as severely worse since last pm. Her LMP Patient's last menstrual period was 10/14/2016. She is on progesterone only ocp's. She is transferred from [facility] ED where she presented with severe pelvic pain 10/10 and pressure, normal bowel function, afebrile, and WBC normal. Pelvic u/s shows the recent development of a right hydrosalpinx with internal debris, which was not present on pelvic u/s 10/2015 Pt is quite dramatic in pain complaints, but has not seen a gyn since the 10/2015 visit with Dr Roselie Awkward. Hospitalization will likely be brief with followup arrangement with [facility] gyn services for long term management plan Will observe for 12-24 hours of iv antitibiotics.  Consults: None  Significant Diagnostic Studies: radiology: Ultrasound: pelvicvis  Treatments: IV hydration and antibiotics: doxycycline and Cefotan  Discharge Exam: Blood pressure (!) 106/59, pulse 65, temperature 98.4 F (36.9 C), temperature source Oral, resp. rate 18, height 5\' 2"  (1.575 m), weight 102.1 kg (225 lb), last menstrual period 10/14/2016, SpO2 100 %. Ultrasound:   Abdomen not tender Disposition: 01-Home or Self Care   Allergies as of 11/17/2016      Reactions   Ivp Dye [iodinated Diagnostic Agents] Anaphylaxis   "Almost died"   Penicillins Hives   Has patient had a PCN reaction causing immediate rash, facial/tongue/throat swelling, SOB or lightheadedness with hypotension: No Has patient had a PCN reaction causing severe rash  involving mucus membranes or skin necrosis: No Has patient had a PCN reaction that required hospitalization No Has patient had a PCN reaction occurring within the last 10 years: No If all of the above answers are "NO", then may proceed with Cephalosporin use.      Medication List    TAKE these medications   amitriptyline 25 MG tablet Commonly known as:  ELAVIL Take 1 tablet (25 mg total) by mouth at bedtime. What changed:  when to take this  reasons to take this   benazepril 10 MG tablet Commonly known as:  LOTENSIN TAKE ONE TABLET BY MOUTH  DAILY   cyclobenzaprine 10 MG tablet Commonly known as:  FLEXERIL Take 1 tablet (10 mg total) by mouth 3 (three) times daily as needed for muscle spasms.   doxycycline 100 MG tablet Commonly known as:  VIBRA-TABS Take 1 tablet (100 mg total) by mouth every 12 (twelve) hours.   levothyroxine 112 MCG tablet Commonly known as:  SYNTHROID, LEVOTHROID Take 1 tablet (112 mcg total) by mouth daily. What changed:  how much to take   norethindrone 0.35 MG tablet Commonly known as:  MICRONOR,CAMILA,ERRIN Take 1 tablet (0.35 mg total) by mouth daily.      Follow-up [facility] for [place] Follow up in 2 week(s).   Specialty:  Obstetrics and Gynecology Contact information: [place] [phone]          Signed: Emeterio Reeve 11/17/2016, 6:42 AM

## 2016-11-17 NOTE — Discharge Instructions (Signed)
Pelvic Inflammatory Disease Introduction Pelvic inflammatory disease (PID) is an infection in some or all of the female organs. PID can be in the uterus, ovaries, fallopian tubes, or the surrounding tissues that are inside the lower belly area (pelvis). PID can lead to lasting problems if it is not treated. To check for this disease, your doctor may:  Do a physical exam.  Do blood tests, urine tests, or a pregnancy test.  Look at your vaginal discharge.  Do tests to look inside the pelvis.  Test you for other infections. Follow these instructions at home:  Take over-the-counter and prescription medicines only as told by your doctor.  If you were prescribed an antibiotic medicine, take it as told by your doctor. Do not stop taking it even if you start to feel better.  Do not have sex until treatment is done or as told by your doctor.  Tell your sex partner if you have PID. Your partner may need to be treated.  Keep all follow-up visits as told by your doctor. This is important.  Your doctor may test you for infection again 3 months after you are treated. Contact a doctor if:  You have more fluid (discharge) coming from your vagina or fluid that is not normal.  Your pain does not improve.  You throw up (vomit).  You have a fever.  You cannot take your medicines.  Your partner has a sexually transmitted disease (STD).  You have pain when you pee (urinate). Get help right away if:  You have more belly (abdominal) or lower belly pain.  You have chills.  You are not better after 72 hours. This information is not intended to replace advice given to you by your health care provider. Make sure you discuss any questions you have with your health care provider. Document Released: 01/21/2009 Document Revised: 04/01/2016 Document Reviewed: 12/02/2014  2017 Elsevier

## 2016-11-19 ENCOUNTER — Other Ambulatory Visit: Payer: Self-pay

## 2016-12-03 ENCOUNTER — Encounter: Payer: Self-pay | Admitting: Obstetrics & Gynecology

## 2016-12-03 ENCOUNTER — Ambulatory Visit (INDEPENDENT_AMBULATORY_CARE_PROVIDER_SITE_OTHER): Payer: Medicare Other | Admitting: Obstetrics & Gynecology

## 2016-12-03 VITALS — BP 123/86 | HR 77 | Wt 245.0 lb

## 2016-12-03 DIAGNOSIS — R102 Pelvic and perineal pain: Secondary | ICD-10-CM | POA: Diagnosis not present

## 2016-12-03 MED ORDER — FLUCONAZOLE 150 MG PO TABS: 150.0000 mg | ORAL_TABLET | Freq: Once | ORAL | 0 refills | Status: AC

## 2016-12-03 MED ORDER — IBUPROFEN 600 MG PO TABS: 600.0000 mg | ORAL_TABLET | Freq: Four times a day (QID) | ORAL | 1 refills | Status: DC | PRN

## 2016-12-03 NOTE — Progress Notes (Signed)
Patient ID: Janice Roberts, female   DOB: 04-01-1976, 41 y.o.   MRN: [id number]  Chief Complaint  Patient presents with  . Follow-up    HPI Janice Roberts is a 41 y.o. female.  [id number] s/p hospitalization 11/26/16 for PID. She has a history of chronic pelvic pain and she had negative testing for STD.   HPI  Past Medical History:  Diagnosis Date  . Anxiety   . Asthma   . Depression   . Hypertension   . Obesity   . Thyroid disease     Past Surgical History:  Procedure Laterality Date  . APPENDECTOMY    . CESAREAN SECTION    . [place] OF UTERUS  08/08/2008 & 03/08/2013  . THYROIDECTOMY      Family History  Problem Relation Age of Onset  . Cancer Mother 89    Breast  . Asthma Sister   . Asthma Brother   . Asthma Sister   . Birth defects Sister     Social History Social History  Substance Use Topics  . Smoking status: Never Smoker  . Smokeless tobacco: Never Used  . Alcohol use No    Allergies  Allergen Reactions  . Ivp Dye [Iodinated Diagnostic Agents] Anaphylaxis    "Almost died"  . Penicillins Hives    Has patient had a PCN reaction causing immediate rash, facial/tongue/throat swelling, SOB or lightheadedness with hypotension: No Has patient had a PCN reaction causing severe rash involving mucus membranes or skin necrosis: No Has patient had a PCN reaction that required hospitalization No Has patient had a PCN reaction occurring within the last 10 years: No If all of the above answers are "NO", then may proceed with Cephalosporin use.     Current Outpatient Prescriptions  Medication Sig Dispense Refill  . amitriptyline (ELAVIL) 25 MG tablet Take 1 tablet (25 mg total) by mouth at bedtime. (Patient taking differently: Take 25 mg by mouth at bedtime as needed for sleep. ) 30 tablet 3  . benazepril (LOTENSIN) 10 MG tablet TAKE ONE TABLET BY MOUTH  DAILY 90 tablet 1  . cyclobenzaprine (FLEXERIL) 10 MG tablet Take 1 tablet (10 mg total) by mouth 3  (three) times daily as needed for muscle spasms. 30 tablet 0  . levothyroxine (SYNTHROID, LEVOTHROID) 112 MCG tablet Take 1 tablet (112 mcg total) by mouth daily. (Patient taking differently: Take 125 mcg by mouth daily. ) 30 tablet 1  . norethindrone (MICRONOR,CAMILA,ERRIN) 0.35 MG tablet Take 1 tablet (0.35 mg total) by mouth daily. 1 Package 11  . ondansetron (ZOFRAN ODT) 4 MG disintegrating tablet Take 1 tablet (4 mg total) by mouth every 6 (six) hours as needed for nausea. 20 tablet 1  . doxycycline (VIBRA-TABS) 100 MG tablet Take 1 tablet (100 mg total) by mouth every 12 (twelve) hours. (Patient not taking: Reported on 12/03/2016) 20 tablet 0  . fluconazole (DIFLUCAN) 150 MG tablet Take 1 tablet (150 mg total) by mouth once. 1 tablet 0  . ibuprofen (ADVIL,MOTRIN) 600 MG tablet Take 1 tablet (600 mg total) by mouth every 6 (six) hours as needed. 30 tablet 1   No current facility-administered medications for this visit.     Review of Systems Review of Systems  Constitutional: Negative.   Gastrointestinal: Negative.   Genitourinary: Positive for dysuria, frequency, menstrual problem, pelvic pain and urgency. Negative for vaginal bleeding.    Blood pressure 123/86, pulse 77, weight 245 lb (111.1 kg).  Physical Exam Physical Exam  Constitutional: She is oriented to person, place, and time. She appears well-developed. No distress.  Cardiovascular: Normal rate.   Pulmonary/Chest: Effort normal.  Abdominal: Soft. There is no tenderness.  Neurological: She is alert and oriented to person, place, and time.  Psychiatric: She has a normal mood and affect. Her behavior is normal.    Data Reviewed CLINICAL DATA:  Constant moderate pelvic pain starting today.  EXAM: TRANSABDOMINAL AND TRANSVAGINAL ULTRASOUND OF PELVIS  DOPPLER ULTRASOUND OF OVARIES  TECHNIQUE: Both transabdominal and transvaginal ultrasound examinations of the pelvis were performed. Transabdominal technique was  performed for global imaging of the pelvis including uterus, ovaries, adnexal regions, and pelvic cul-de-sac.  It was necessary to proceed with endovaginal exam following the transabdominal exam to visualize the uterus and ovaries. Color and duplex Doppler ultrasound was utilized to evaluate blood flow to the ovaries.  COMPARISON:  10/22/2015  FINDINGS: Uterus  Measurements: 7 x 5.6 x 5.8 cm. Uterus is retroverted. Uterine myometrium is heterogeneous and somewhat nodular in appearance. Findings could indicate myometrial fibroids and/ or adenomyosis. Similar appearance to previous study.  Endometrium  Thickness: 4 mm.  No focal abnormality visualized.  Right ovary  Right ovary is not visualized. There is a loculated appearing complex fluid collection in the right adnexal region. This could represent hematoma or infection. Cystic neoplasm less likely.  Left ovary  Measurements: 2.3 x 1.8 x 1.9 cm. Normal follicular changes in the left ovary. Complex tubular structure demonstrated in the left adnexum adjacent to the ovary. This measures about 3.5 x 1 x 1.6 cm. This may represent hydrosalpinx or salpingitis.  Pulsed Doppler evaluation of the left ovary demonstrates normal low-resistance arterial and venous waveforms.  Other findings  Small amount of free fluid in the pelvis.  IMPRESSION: Retroverted uterus with heterogeneous appearance of myometrium possibly indicating fibroids and/or adenomyosis.  Right ovary not visualized. Loculated appearing complex fluid collection in the right adnexal region. This could represent hematoma or infection. Left ovary appears normal. Complex tubular structure adjacent to the left ovary may represent hydrosalpinx or salpingitis.   Electronically Signed   By: Lucienne Capers M.D.   On: 11/15/2016 02:44  Assessment    Chronic pelvic pain after treatment for PID Possible endometriosis and/or IC    Plan     Diagnostic laparoscopy as OP.Marland Kitchen  The risks of surgery were discussed in detail with the patient including but not limited to: bleeding which may require transfusion or reoperation; infection which may require prolonged hospitalization or re-hospitalization and antibiotic therapy; injury to bowel, bladder, ureters and major vessels or other surrounding organs; need for additional procedures including laparotomy; thromboembolic phenomenon, incisional problems and other postoperative or anesthesia complications.  Patient was told that the likelihood that her condition and symptoms will be treated effectively with this surgical management was very high; the postoperative expectations were also discussed in detail. The patient also understands the alternative treatment options which were discussed in full. All questions were answered.  She was told that she will be contacted by our surgical scheduler regarding the time and date of her surgery; routine preoperative instructions of having nothing to eat or drink after midnight on the day prior to surgery and also coming to the hospital 1.5 hours prior to her time of surgery were also emphasized.  She was told she may be called for a preoperative appointment about a week prior to surgery and will be given further preoperative instructions at that visit. Printed patient education handouts about the procedure  were given to the patient to review at home.         Emeterio Reeve 12/03/2016, 12:00 PM

## 2016-12-06 ENCOUNTER — Other Ambulatory Visit: Payer: Self-pay | Admitting: Obstetrics & Gynecology

## 2016-12-06 ENCOUNTER — Encounter (HOSPITAL_COMMUNITY): Payer: Self-pay

## 2016-12-13 ENCOUNTER — Encounter (HOSPITAL_COMMUNITY): Payer: Self-pay

## 2016-12-30 ENCOUNTER — Other Ambulatory Visit: Payer: Self-pay | Admitting: Physician Assistant

## 2016-12-30 DIAGNOSIS — I10 Essential (primary) hypertension: Secondary | ICD-10-CM

## 2016-12-30 NOTE — Telephone Encounter (Signed)
Refill appropriate 

## 2017-01-10 NOTE — Patient Instructions (Signed)
Your procedure is scheduled on:  Monday, January 24, 2017  Enter through the [employer] [facility] at:  8:15 AM  Pick up the phone at the desk and dial [phone].  Call this number if you have problems the morning of surgery: [phone].  Remember: Do NOT eat food or drink after:  Midnight Sunday, January 23, 2017  Take these medicines the morning of surgery with a SIP OF WATER:  Benazepril, Levothyroxine  Stop ALL herbal medications at this time  Do NOT smoke the day of surgery.  Do NOT wear jewelry (body piercing), metal hair clips/bobby pins, make-up, or nail polish. Do NOT wear lotions, powders, or perfumes.  You may wear deodorant. Do NOT shave for 48 hours prior to surgery. Do NOT bring valuables to the hospital. Contacts, dentures, or bridgework may not be worn into surgery.  Have a responsible adult drive you home and stay with you for 24 hours after your procedure  Bring a copy of your healthcare power of attorney and living will documents.  **Effective Friday, Jan. 12, 2018, [place] will implement no hospital visitations from children age 73 and younger due to a steady increase in flu activity in our community and hospitals. **

## 2017-01-11 ENCOUNTER — Encounter (HOSPITAL_COMMUNITY): Payer: Self-pay

## 2017-01-11 ENCOUNTER — Encounter (HOSPITAL_COMMUNITY)
Admission: RE | Admit: 2017-01-11 | Discharge: 2017-01-11 | Disposition: A | Discharge status: Home / Self Care | Payer: Medicare Other | Source: Ambulatory Visit | Attending: Obstetrics & Gynecology | Admitting: Obstetrics & Gynecology

## 2017-01-11 DIAGNOSIS — Z01812 Encounter for preprocedural laboratory examination: Secondary | ICD-10-CM | POA: Insufficient documentation

## 2017-01-11 DIAGNOSIS — Z01810 Encounter for preprocedural cardiovascular examination: Secondary | ICD-10-CM | POA: Diagnosis not present

## 2017-01-11 HISTORY — DX: Hypothyroidism, unspecified: E03.9

## 2017-01-11 LAB — CBC
HEMATOCRIT: 36.6 % (ref 36.0–46.0)
HEMOGLOBIN: 12.3 g/dL (ref 12.0–15.0)
MCH: 28.3 pg (ref 26.0–34.0)
MCHC: 33.6 g/dL (ref 30.0–36.0)
MCV: 84.3 fL (ref 78.0–100.0)
Platelets: 272 10*3/uL (ref 150–400)
RBC: 4.34 MIL/uL (ref 3.87–5.11)
RDW: 15.1 % (ref 11.5–15.5)
WBC: 7.8 10*3/uL (ref 4.0–10.5)

## 2017-01-11 LAB — BASIC METABOLIC PANEL
Anion gap: 5 (ref 5–15)
BUN: 13 mg/dL (ref 6–20)
CHLORIDE: 107 mmol/L (ref 101–111)
CO2: 25 mmol/L (ref 22–32)
CREATININE: 0.8 mg/dL (ref 0.44–1.00)
Calcium: 8.9 mg/dL (ref 8.9–10.3)
GFR calc non Af Amer: >60 mL/min (ref >60)
GLUCOSE: 110 mg/dL — AB (ref 65–99)
Potassium: 3.8 mmol/L (ref 3.5–5.1)
Sodium: 137 mmol/L (ref 135–145)

## 2017-01-11 LAB — TYPE AND SCREEN
ABO/RH(D): B POS
Antibody Screen: NEGATIVE

## 2017-01-11 LAB — ABO/RH: ABO/RH(D): B POS

## 2017-01-24 ENCOUNTER — Ambulatory Visit (HOSPITAL_COMMUNITY)
Admission: RE | Admit: 2017-01-24 | Discharge: 2017-01-24 | Disposition: A | Discharge status: Home / Self Care | Payer: Medicare Other | Source: Ambulatory Visit | Attending: Obstetrics & Gynecology | Admitting: Obstetrics & Gynecology

## 2017-01-24 ENCOUNTER — Ambulatory Visit (HOSPITAL_COMMUNITY): Payer: Medicare Other | Admitting: Anesthesiology

## 2017-01-24 ENCOUNTER — Encounter (HOSPITAL_COMMUNITY): Payer: Self-pay | Admitting: *Deleted

## 2017-01-24 ENCOUNTER — Encounter (HOSPITAL_COMMUNITY)
Admission: RE | Disposition: A | Discharge status: Home / Self Care | Payer: Self-pay | Source: Ambulatory Visit | Attending: Obstetrics & Gynecology

## 2017-01-24 DIAGNOSIS — J45909 Unspecified asthma, uncomplicated: Secondary | ICD-10-CM | POA: Diagnosis not present

## 2017-01-24 DIAGNOSIS — G8929 Other chronic pain: Secondary | ICD-10-CM | POA: Insufficient documentation

## 2017-01-24 DIAGNOSIS — Z825 Family history of asthma and other chronic lower respiratory diseases: Secondary | ICD-10-CM | POA: Insufficient documentation

## 2017-01-24 DIAGNOSIS — Z803 Family history of malignant neoplasm of breast: Secondary | ICD-10-CM | POA: Diagnosis not present

## 2017-01-24 DIAGNOSIS — N8301 Follicular cyst of right ovary: Secondary | ICD-10-CM | POA: Insufficient documentation

## 2017-01-24 DIAGNOSIS — Z8489 Family history of other specified conditions: Secondary | ICD-10-CM | POA: Diagnosis not present

## 2017-01-24 DIAGNOSIS — E890 Postprocedural hypothyroidism: Secondary | ICD-10-CM | POA: Diagnosis not present

## 2017-01-24 DIAGNOSIS — R102 Pelvic and perineal pain: Secondary | ICD-10-CM | POA: Diagnosis present

## 2017-01-24 DIAGNOSIS — N809 Endometriosis, unspecified: Secondary | ICD-10-CM | POA: Diagnosis not present

## 2017-01-24 DIAGNOSIS — N920 Excessive and frequent menstruation with regular cycle: Secondary | ICD-10-CM | POA: Insufficient documentation

## 2017-01-24 DIAGNOSIS — F419 Anxiety disorder, unspecified: Secondary | ICD-10-CM | POA: Insufficient documentation

## 2017-01-24 DIAGNOSIS — Z79899 Other long term (current) drug therapy: Secondary | ICD-10-CM | POA: Diagnosis not present

## 2017-01-24 DIAGNOSIS — N803 Endometriosis of pelvic peritoneum: Secondary | ICD-10-CM | POA: Insufficient documentation

## 2017-01-24 DIAGNOSIS — Z91041 Radiographic dye allergy status: Secondary | ICD-10-CM | POA: Diagnosis not present

## 2017-01-24 DIAGNOSIS — I10 Essential (primary) hypertension: Secondary | ICD-10-CM | POA: Diagnosis not present

## 2017-01-24 DIAGNOSIS — F329 Major depressive disorder, single episode, unspecified: Secondary | ICD-10-CM | POA: Insufficient documentation

## 2017-01-24 DIAGNOSIS — Z880 Allergy status to penicillin: Secondary | ICD-10-CM | POA: Insufficient documentation

## 2017-01-24 HISTORY — PX: LAPAROSCOPY: SHX197

## 2017-01-24 LAB — PREGNANCY, URINE: PREG TEST UR: NEGATIVE

## 2017-01-24 SURGERY — LAPAROSCOPY, DIAGNOSTIC
Anesthesia: General | Site: Abdomen

## 2017-01-24 MED ORDER — SUGAMMADEX SODIUM 200 MG/2ML IV SOLN
INTRAVENOUS | Status: AC
  Filled 2017-01-24: qty 2

## 2017-01-24 MED ORDER — SCOPOLAMINE 1 MG/3DAYS TD PT72
MEDICATED_PATCH | TRANSDERMAL | Status: AC
  Filled 2017-01-24: qty 1

## 2017-01-24 MED ORDER — ONDANSETRON HCL 4 MG/2ML IJ SOLN
INTRAMUSCULAR | Status: AC
  Filled 2017-01-24: qty 2

## 2017-01-24 MED ORDER — MEPERIDINE HCL 25 MG/ML IJ SOLN: 6.2500 mg | INTRAMUSCULAR | Status: DC | PRN

## 2017-01-24 MED ORDER — FENTANYL CITRATE (PF) 100 MCG/2ML IJ SOLN: 25.0000 ug | INTRAMUSCULAR | Status: DC | PRN

## 2017-01-24 MED ORDER — DEXAMETHASONE SODIUM PHOSPHATE 4 MG/ML IJ SOLN
INTRAMUSCULAR | Status: AC
  Filled 2017-01-24: qty 1

## 2017-01-24 MED ORDER — ONDANSETRON HCL 4 MG/2ML IJ SOLN
INTRAMUSCULAR | Status: DC | PRN
  Administered 2017-01-24: 4 mg via INTRAVENOUS

## 2017-01-24 MED ORDER — PROPOFOL 10 MG/ML IV BOLUS
INTRAVENOUS | Status: DC | PRN
  Administered 2017-01-24: 250 mg via INTRAVENOUS

## 2017-01-24 MED ORDER — DEXAMETHASONE SODIUM PHOSPHATE 10 MG/ML IJ SOLN
INTRAMUSCULAR | Status: DC | PRN
  Administered 2017-01-24: 4 mg via INTRAVENOUS

## 2017-01-24 MED ORDER — ROCURONIUM BROMIDE 100 MG/10ML IV SOLN
INTRAVENOUS | Status: DC | PRN
  Administered 2017-01-24: 50 mg via INTRAVENOUS

## 2017-01-24 MED ORDER — MIDAZOLAM HCL 2 MG/2ML IJ SOLN
INTRAMUSCULAR | Status: AC
  Filled 2017-01-24: qty 2

## 2017-01-24 MED ORDER — MEPERIDINE HCL 25 MG/ML IJ SOLN
INTRAMUSCULAR | Status: DC | PRN
  Administered 2017-01-24: 25 mg via INTRAVENOUS

## 2017-01-24 MED ORDER — LIDOCAINE HCL (CARDIAC) 20 MG/ML IV SOLN
INTRAVENOUS | Status: DC | PRN
  Administered 2017-01-24: 50 mg via INTRAVENOUS

## 2017-01-24 MED ORDER — ROCURONIUM BROMIDE 100 MG/10ML IV SOLN
INTRAVENOUS | Status: AC
  Filled 2017-01-24: qty 1

## 2017-01-24 MED ORDER — SCOPOLAMINE 1 MG/3DAYS TD PT72
1.0000 | MEDICATED_PATCH | Freq: Once | TRANSDERMAL | Status: DC
  Administered 2017-01-24: 1.5 mg via TRANSDERMAL

## 2017-01-24 MED ORDER — FENTANYL CITRATE (PF) 100 MCG/2ML IJ SOLN
INTRAMUSCULAR | Status: DC | PRN
  Administered 2017-01-24: 50 ug via INTRAVENOUS
  Administered 2017-01-24 (×2): 100 ug via INTRAVENOUS

## 2017-01-24 MED ORDER — LIDOCAINE HCL (CARDIAC) 20 MG/ML IV SOLN
INTRAVENOUS | Status: AC
  Filled 2017-01-24: qty 5

## 2017-01-24 MED ORDER — LACTATED RINGERS IV SOLN: INTRAVENOUS | Status: DC

## 2017-01-24 MED ORDER — LACTATED RINGERS IV SOLN
INTRAVENOUS | Status: DC
  Administered 2017-01-24: 11:00:00 via INTRAVENOUS
  Administered 2017-01-24: 1000 mL via INTRAVENOUS

## 2017-01-24 MED ORDER — MEPERIDINE HCL 25 MG/ML IJ SOLN
INTRAMUSCULAR | Status: AC
  Filled 2017-01-24: qty 1

## 2017-01-24 MED ORDER — FENTANYL CITRATE (PF) 250 MCG/5ML IJ SOLN
INTRAMUSCULAR | Status: AC
  Filled 2017-01-24: qty 5

## 2017-01-24 MED ORDER — LABETALOL HCL 5 MG/ML IV SOLN
INTRAVENOUS | Status: DC | PRN
  Administered 2017-01-24 (×2): 5 mg via INTRAVENOUS
  Administered 2017-01-24: 10 mg via INTRAVENOUS

## 2017-01-24 MED ORDER — OXYCODONE-ACETAMINOPHEN 5-325 MG PO TABS: 1.0000 | ORAL_TABLET | Freq: Four times a day (QID) | ORAL | 0 refills | Status: DC | PRN

## 2017-01-24 MED ORDER — PROPOFOL 10 MG/ML IV BOLUS
INTRAVENOUS | Status: AC
  Filled 2017-01-24: qty 40

## 2017-01-24 MED ORDER — BUPIVACAINE HCL (PF) 0.25 % IJ SOLN
INTRAMUSCULAR | Status: AC
  Filled 2017-01-24: qty 30

## 2017-01-24 MED ORDER — METOCLOPRAMIDE HCL 5 MG/ML IJ SOLN: 10.0000 mg | Freq: Once | INTRAMUSCULAR | Status: DC | PRN

## 2017-01-24 MED ORDER — MIDAZOLAM HCL 2 MG/2ML IJ SOLN
INTRAMUSCULAR | Status: DC | PRN
  Administered 2017-01-24: 2 mg via INTRAVENOUS

## 2017-01-24 MED ORDER — BUPIVACAINE HCL (PF) 0.25 % IJ SOLN
INTRAMUSCULAR | Status: DC | PRN
  Administered 2017-01-24: 7 mL

## 2017-01-24 MED ORDER — MEDROXYPROGESTERONE ACETATE 10 MG PO TABS: 20.0000 mg | ORAL_TABLET | Freq: Every day | ORAL | 2 refills | Status: DC

## 2017-01-24 SURGICAL SUPPLY — 31 items
CABLE HIGH FREQUENCY MONO STRZ (ELECTRODE) IMPLANT
CLOTH BEACON ORANGE TIMEOUT ST (SAFETY) ×2 IMPLANT
DERMABOND ADVANCED (GAUZE/BANDAGES/DRESSINGS) ×1
DERMABOND ADVANCED .7 DNX12 (GAUZE/BANDAGES/DRESSINGS) ×1 IMPLANT
DRSG OPSITE POSTOP 3X4 (GAUZE/BANDAGES/DRESSINGS) IMPLANT
DURAPREP 26ML APPLICATOR (WOUND CARE) ×2 IMPLANT
GLOVE BIO SURGEON STRL SZ 6.5 (GLOVE) ×2 IMPLANT
GLOVE BIOGEL PI IND STRL 7.0 (GLOVE) ×2 IMPLANT
GLOVE BIOGEL PI INDICATOR 7.0 (GLOVE) ×2
GOWN STRL REUS W/TWL LRG LVL3 (GOWN DISPOSABLE) ×4 IMPLANT
NEEDLE INSUFFLATION 120MM (ENDOMECHANICALS) ×2 IMPLANT
NS IRRIG 1000ML POUR BTL (IV SOLUTION) IMPLANT
PACK LAPAROSCOPY BASIN (CUSTOM PROCEDURE TRAY) ×2 IMPLANT
PACK TRENDGUARD 450 HYBRID PRO (MISCELLANEOUS) IMPLANT
PACK TRENDGUARD 600 HYBRD PROC (MISCELLANEOUS) ×1 IMPLANT
PROTECTOR NERVE ULNAR (MISCELLANEOUS) ×4 IMPLANT
SET IRRIG TUBING LAPAROSCOPIC (IRRIGATION / IRRIGATOR) IMPLANT
SHEARS HARMONIC ACE PLUS 36CM (ENDOMECHANICALS) IMPLANT
SLEEVE XCEL OPT CAN 5 100 (ENDOMECHANICALS) IMPLANT
STRIP CLOSURE SKIN 1/2X4 (GAUZE/BANDAGES/DRESSINGS) IMPLANT
SUT VICRYL 0 UR6 27IN ABS (SUTURE) IMPLANT
SUT VICRYL 4-0 PS2 18IN ABS (SUTURE) ×2 IMPLANT
TOWEL OR 17X24 6PK STRL BLUE (TOWEL DISPOSABLE) ×4 IMPLANT
TRAY FOLEY CATH SILVER 16FR (SET/KITS/TRAYS/PACK) ×2 IMPLANT
TRENDGUARD 450 HYBRID PRO PACK (MISCELLANEOUS)
TRENDGUARD 600 HYBRID PROC PK (MISCELLANEOUS) ×2
TROCAR OPTI TIP 5M 100M (ENDOMECHANICALS) ×2 IMPLANT
TROCAR XCEL DIL TIP R 11M (ENDOMECHANICALS) IMPLANT
TROCAR XCEL OPT SLVE 5M 100M (ENDOMECHANICALS) ×2 IMPLANT
WARMER LAPAROSCOPE (MISCELLANEOUS) ×2 IMPLANT
WATER STERILE IRR 1000ML POUR (IV SOLUTION) ×2 IMPLANT

## 2017-01-24 NOTE — Discharge Instructions (Signed)
Diagnostic Laparoscopy, Care After Refer to this sheet in the next few weeks. These instructions provide you with information about caring for yourself after your procedure. Your health care provider may also give you more specific instructions. Your treatment has been planned according to current medical practices, but problems sometimes occur. Call your health care provider if you have any problems or questions after your procedure. What can I expect after the procedure? After your procedure, it is common to have mild discomfort in the throat and abdomen. Follow these instructions at home:  Take over-the-counter and prescription medicines only as told by your health care provider.  Do not drive for 24 hours if you received a sedative.  Return to your normal activities as told by your health care provider.   Do not take baths, swim, or use a hot tub until your health care provider approves. You may shower.  Follow instructions from your health care provider about how to take care of your incision. Make sure you:  Wash your hands with soap and water before you change your bandage (dressing). If soap and water are not available, use hand sanitizer.  Change your dressing as told by your health care provider.  Leave  skin glue in place. Do Not pick at or peel off.  Check your incision area every day for signs of infection. Check for:  More redness, swelling, or pain.  More fluid or blood.  Warmth.  Pus or a bad smell.  It is your responsibility to get the results of your procedure. Ask your health care provider or the department performing the procedure when your results will be ready. Contact a health care provider if:  There is new pain in your shoulders.  You feel light-headed or faint.  You are unable to pass gas or unable to have a bowel movement.  You feel nauseous or you vomit.  You develop a rash.  You have more redness, swelling, or pain around your  incision.  You have more fluid or blood coming from your incision.  Your incision feels warm to the touch.  You have pus or a bad smell coming from your incision.  You have a fever or chills. Get help right away if:  Your pain is getting worse.  You have ongoing vomiting.  The edges of your incision open up.  You have trouble breathing.  You have chest pain. This information is not intended to replace advice given to you by your health care provider. Make sure you discuss any questions you have with your health care provider. Document Released: 10/06/2015 Document Revised: 04/01/2016 Document Reviewed: 07/08/2015 Elsevier Interactive Patient Education  2017 [employer]  General Anesthesia, Adult, Care After These instructions provide you with information about caring for yourself after your procedure. Your health care provider may also give you more specific instructions. Your treatment has been planned according to current medical practices, but problems sometimes occur. Call your health care provider if you have any problems or questions after your procedure. What can I expect after the procedure? After the procedure, it is common to have:  Vomiting.  A sore throat.  Mental slowness. It is common to feel:  Nauseous.  Cold or shivery.  Sleepy.  Tired.  Sore or achy, even in parts of your body where you did not have surgery. Follow these instructions at home: For at least 24 hours after the procedure:   Do not:  Participate in activities where you could fall or become injured.  Drive.  Use heavy machinery.  Drink alcohol.  Take sleeping pills or medicines that cause drowsiness.  Make important decisions or sign legal documents.  Take care of children on your own.  Rest. Eating and drinking   If you vomit, drink water, juice, or soup when you can drink without vomiting.  Drink enough fluid to keep your urine clear or pale yellow.  Make sure you  have little or no nausea before eating solid foods.  Follow the diet recommended by your health care provider. General instructions   Have a responsible adult stay with you until you are awake and alert.  Return to your normal activities as told by your health care provider. Ask your health care provider what activities are safe for you.  Take over-the-counter and prescription medicines only as told by your health care provider.  If you smoke, do not smoke without supervision.  Keep all follow-up visits as told by your health care provider. This is important. Contact a health care provider if:  You continue to have nausea or vomiting at home, and medicines are not helpful.  You cannot drink fluids or start eating again.  You cannot urinate after 8-12 hours.  You develop a skin rash.  You have fever.  You have increasing redness at the site of your procedure. Get help right away if:  You have difficulty breathing.  You have chest pain.  You have unexpected bleeding.  You feel that you are having a life-threatening or urgent problem. This information is not intended to replace advice given to you by your health care provider. Make sure you discuss any questions you have with your health care provider. Document Released: 01/31/2001 Document Revised: 03/29/2016 Document Reviewed: 10/09/2015 Elsevier Interactive Patient Education  2017 [employer].

## 2017-01-24 NOTE — Anesthesia Preprocedure Evaluation (Signed)
Anesthesia Evaluation  Patient identified by MRN, date of birth, ID band Patient awake    Reviewed: Allergy & Precautions, NPO status , Patient's Chart, lab work & pertinent test results  Airway Mallampati: II  TM Distance: >3 FB Neck ROM: Full    Dental no notable dental hx. (+) Caps   Pulmonary asthma ,    Pulmonary exam normal breath sounds clear to auscultation       Cardiovascular hypertension, Pt. on medications Normal cardiovascular exam Rhythm:Regular Rate:Normal     Neuro/Psych negative neurological ROS  negative psych ROS   GI/Hepatic negative GI ROS, Neg liver ROS,   Endo/Other  Hypothyroidism Morbid obesity  Renal/GU negative Renal ROS  negative genitourinary   Musculoskeletal negative musculoskeletal ROS (+)   Abdominal   Peds negative pediatric ROS (+)  Hematology negative hematology ROS (+)   Anesthesia Other Findings   Reproductive/Obstetrics negative OB ROS                             Anesthesia Physical Anesthesia Plan  ASA: III  Anesthesia Plan: General   Post-op Pain Management:    Induction: Intravenous  Airway Management Planned: Oral ETT  Additional Equipment:   Intra-op Plan:   Post-operative Plan: Extubation in OR  Informed Consent: I have reviewed the patients History and Physical, chart, labs and discussed the procedure including the risks, benefits and alternatives for the proposed anesthesia with the patient or authorized representative who has indicated his/her understanding and acceptance.   Dental advisory given  Plan Discussed with: CRNA  Anesthesia Plan Comments:         Anesthesia Quick Evaluation

## 2017-01-24 NOTE — Anesthesia Postprocedure Evaluation (Signed)
Anesthesia Post Note  Patient: Janice Roberts  Procedure(s) Performed: Procedure(s) (LRB): LAPAROSCOPY DIAGNOSTIC (N/A)  Patient location during evaluation: PACU Anesthesia Type: General Level of consciousness: awake and alert Pain management: pain level controlled Vital Signs Assessment: post-procedure vital signs reviewed and stable Respiratory status: spontaneous breathing, nonlabored ventilation, respiratory function stable and patient connected to nasal cannula oxygen Cardiovascular status: blood pressure returned to baseline and stable Postop Assessment: no signs of nausea or vomiting Anesthetic complications: no        Last Vitals:  Vitals:   01/24/17 1215 01/24/17 1250  BP:  121/80  Pulse: 79 80  Resp: 14 16  Temp:  37 C    Last Pain:  Vitals:   01/24/17 1250  TempSrc:   PainSc: 3    Pain Goal: Patients Stated Pain Goal: 4 (01/24/17 0824)               Montez Hageman

## 2017-01-24 NOTE — H&P (Signed)
Chief Complaint  Patient presents with  Chronic pelvic pain  HPI Janice Roberts is a 41 y.o. female.  [id number] s/p hospitalization 11/26/16 for PID. She has a history of chronic pelvic pain and she had negative testing for STD. She is scheduled for dx laparoscopy today, and I explained that operative procedures may be done if indicated and she agrees.  HPI      Past Medical History:  Diagnosis Date  . Anxiety   . Asthma   . Depression   . Hypertension   . Obesity   . Thyroid disease          Past Surgical History:  Procedure Laterality Date  . APPENDECTOMY    . CESAREAN SECTION    . [place] OF UTERUS  08/08/2008 & 03/08/2013  . THYROIDECTOMY            Family History  Problem Relation Age of Onset  . Cancer Mother 24    Breast  . Asthma Sister   . Asthma Brother   . Asthma Sister   . Birth defects Sister     Social History     Social History  Substance Use Topics  . Smoking status: Never Smoker  . Smokeless tobacco: Never Used  . Alcohol use No         Allergies  Allergen Reactions  . Ivp Dye [Iodinated Diagnostic Agents] Anaphylaxis    "Almost died"  . Penicillins Hives    Has patient had a PCN reaction causing immediate rash, facial/tongue/throat swelling, SOB or lightheadedness with hypotension: No Has patient had a PCN reaction causing severe rash involving mucus membranes or skin necrosis: No Has patient had a PCN reaction that required hospitalization No Has patient had a PCN reaction occurring within the last 10 years: No If all of the above answers are "NO", then may proceed with Cephalosporin use.           Current Outpatient Prescriptions  Medication Sig Dispense Refill  . amitriptyline (ELAVIL) 25 MG tablet Take 1 tablet (25 mg total) by mouth at bedtime. (Patient taking differently: Take 25 mg by mouth at bedtime as needed for sleep. ) 30 tablet 3  . benazepril (LOTENSIN) 10 MG tablet TAKE  ONE TABLET BY MOUTH  DAILY 90 tablet 1  . cyclobenzaprine (FLEXERIL) 10 MG tablet Take 1 tablet (10 mg total) by mouth 3 (three) times daily as needed for muscle spasms. 30 tablet 0  . levothyroxine (SYNTHROID, LEVOTHROID) 112 MCG tablet Take 1 tablet (112 mcg total) by mouth daily. (Patient taking differently: Take 125 mcg by mouth daily. ) 30 tablet 1  . norethindrone (MICRONOR,CAMILA,ERRIN) 0.35 MG tablet Take 1 tablet (0.35 mg total) by mouth daily. 1 Package 11  . ondansetron (ZOFRAN ODT) 4 MG disintegrating tablet Take 1 tablet (4 mg total) by mouth every 6 (six) hours as needed for nausea. 20 tablet 1  . doxycycline (VIBRA-TABS) 100 MG tablet Take 1 tablet (100 mg total) by mouth every 12 (twelve) hours. (Patient not taking: Reported on 12/03/2016) 20 tablet 0  . fluconazole (DIFLUCAN) 150 MG tablet Take 1 tablet (150 mg total) by mouth once. 1 tablet 0  . ibuprofen (ADVIL,MOTRIN) 600 MG tablet Take 1 tablet (600 mg total) by mouth every 6 (six) hours as needed. 30 tablet 1   No current facility-administered medications for this visit.     Review of Systems Review of Systems  Constitutional: Negative.   Gastrointestinal: Negative.   Genitourinary: Positive  for dysuria, frequency, menstrual problem, pelvic pain and urgency. Negative for vaginal bleeding.    Blood pressure (!) 135/96, pulse 80, temperature 98.4 F (36.9 C), temperature source Oral, resp. rate 16, SpO2 98 %.   Physical Exam Physical Exam  Constitutional: She is oriented to person, place, and time. She appears well-developed. No distress.  Cardiovascular: Normal rate.   Pulmonary/Chest: Effort normal.  Abdominal: Soft. There is no tenderness.  Neurological: She is alert and oriented to person, place, and time.  Psychiatric: She has a normal mood and affect. Her behavior is normal.    Data Reviewed CLINICAL DATA: Constant moderate pelvic pain starting today.  EXAM: TRANSABDOMINAL AND TRANSVAGINAL  ULTRASOUND OF PELVIS  DOPPLER ULTRASOUND OF OVARIES  TECHNIQUE: Both transabdominal and transvaginal ultrasound examinations of the pelvis were performed. Transabdominal technique was performed for global imaging of the pelvis including uterus, ovaries, adnexal regions, and pelvic cul-de-sac.  It was necessary to proceed with endovaginal exam following the transabdominal exam to visualize the uterus and ovaries. Color and duplex Doppler ultrasound was utilized to evaluate blood flow to the ovaries.  COMPARISON: 10/22/2015  FINDINGS: Uterus  Measurements: 7 x 5.6 x 5.8 cm. Uterus is retroverted. Uterine myometrium is heterogeneous and somewhat nodular in appearance. Findings could indicate myometrial fibroids and/ or adenomyosis. Similar appearance to previous study.  Endometrium  Thickness: 4 mm. No focal abnormality visualized.  Right ovary  Right ovary is not visualized. There is a loculated appearing complex fluid collection in the right adnexal region. This could represent hematoma or infection. Cystic neoplasm less likely.  Left ovary  Measurements: 2.3 x 1.8 x 1.9 cm. Normal follicular changes in the left ovary. Complex tubular structure demonstrated in the left adnexum adjacent to the ovary. This measures about 3.5 x 1 x 1.6 cm. This may represent hydrosalpinx or salpingitis.  Pulsed Doppler evaluation of the left ovary demonstrates normal low-resistance arterial and venous waveforms.  Other findings  Small amount of free fluid in the pelvis.  IMPRESSION: Retroverted uterus with heterogeneous appearance of myometrium possibly indicating fibroids and/or adenomyosis.  Right ovary not visualized. Loculated appearing complex fluid collection in the right adnexal region. This could represent hematoma or infection. Left ovary appears normal. Complex tubular structure adjacent to the left ovary may represent hydrosalpinx  or salpingitis.   Electronically Signed By: Lucienne Capers M.D. On: 11/15/2016 02:44  Assessment    Chronic pelvic pain after treatment for PID Possible endometriosis and/or IC  Plan    Diagnostic laparoscopy as OP.Marland Kitchen  The risks of surgery were discussed in detail with the patient including but not limited to: bleeding which may require transfusion or reoperation; infection which may require prolonged hospitalization or re-hospitalization and antibiotic therapy; injury to bowel, bladder, ureters and major vessels or other surrounding organs; need for additional procedures including laparotomy; thromboembolic phenomenon, incisional problems and other postoperative or anesthesia complications.  Patient was told that the likelihood that her condition and symptoms will be treated effectively with this surgical management was very high; the postoperative expectations were also discussed in detail. The patient also understands the alternative treatment options which were discussed in full. All questions were answered.          Emeterio Reeve 01/24/2017 9:20 AM

## 2017-01-24 NOTE — Transfer of Care (Signed)
Immediate Anesthesia Transfer of Care Note  Patient: Janice Roberts  Procedure(s) Performed: Procedure(s): LAPAROSCOPY DIAGNOSTIC (N/A)  Patient Location: PACU  Anesthesia Type:General  Level of Consciousness: awake, alert  and oriented  Airway & Oxygen Therapy: Patient Spontanous Breathing and Patient connected to nasal cannula oxygen  Post-op Assessment: Report given to RN and Post -op Vital signs reviewed and stable  Post vital signs: Reviewed and stable  Last Vitals:  Vitals:   01/24/17 0824 01/24/17 1041  BP: (!) 135/96 (!) 141/95  Pulse: 80 92  Resp: 16 (!) 27  Temp: 36.9 C 36.8 C    Last Pain:  Vitals:   01/24/17 0824  TempSrc: Oral  PainSc: 7       Patients Stated Pain Goal: 4 (02/40/97 3532)  Complications: No apparent anesthesia complications

## 2017-01-24 NOTE — Op Note (Signed)
Diagnostic Laparoscopy Procedure Note  Indications: The patient is a 41 y.o. female with chronic pelvic pain and menorrhagia.  Pre-operative Diagnosis: Chronic pelvic pain  Post-operative Diagnosis: suspected endometriosis  Surgeon: Emeterio Reeve   Assistants: n/a  Anesthesia: General endotracheal anesthesia  ASA Class: 2  Procedure Details  The patient was seen in the Holding Room. The risks, benefits, complications, treatment options, and expected outcomes were discussed with the patient. The possibilities of reaction to medication, pulmonary aspiration, perforation of viscus, bleeding, recurrent infection, the need for additional procedures, failure to diagnose a condition, and creating a complication requiring transfusion or operation were discussed with the patient. The patient concurred with the proposed plan, giving informed consent. The patient was taken to the Operating Room, identified as Janice Roberts and the procedure verified as Diagnostic Laparoscopy. A Time Out was held and the above information confirmed.  After induction of general anesthesia, the patient was placed in modified dorsal lithotomy position where she was prepped, draped, and catheterized in the normal, sterile fashion.  The cervix was visualized and an intrauterine manipulator was placed. A 5 mm cm umbilical incision was then performed. Veress needle was passed and pneumoperitoneum was established. A 5 mm trocar was placed in the right lower quadrant and a probe was placed Findings: The anterior cul-de-sac and round ligaments Cesarean section scar at bladder reflection  The uterus serosal vesicles c/w endometriosis The adnexa right ovary with follicle cyst otherwise norma Cul-de-sac single endometriosis implant  Following the procedure the umbilical sheath was removed after intra-abdominal carbon dioxide was expressed. The incision was closed with subcutaneous and subcuticular sutures of 4-0 Vicryl. The  intrauterine manipulator was then removed.  Instrument, sponge, and needle counts were correct prior to abdominal closure and at the conclusion of the case.    Estimated Blood Loss:  Minimal         Drains: Foley catheter         Total IV Fluids: 1065mL         Specimens: none              Complications:  None; patient tolerated the procedure well.         Disposition: PACU - hemodynamically stable.         Condition: stable  Woodroe Mode, MD 01/24/2017 10:37 AM

## 2017-01-24 NOTE — Anesthesia Procedure Notes (Signed)
Procedure Name: Intubation Date/Time: 01/24/2017 9:46 AM Performed by: Jonna Munro Pre-anesthesia Checklist: Patient identified, Emergency Drugs available, Suction available, Patient being monitored and Timeout performed Patient Re-evaluated:Patient Re-evaluated prior to inductionOxygen Delivery Method: Circle system utilized Preoxygenation: Pre-oxygenation with 100% oxygen Intubation Type: IV induction Ventilation: Mask ventilation without difficulty Laryngoscope Size: Mac and 3 Grade View: Grade I Tube type: Oral Tube size: 7.0 mm Number of attempts: 1 Airway Equipment and Method: Stylet Placement Confirmation: ETT inserted through vocal cords under direct vision,  positive ETCO2 and breath sounds checked- equal and bilateral Secured at: 21 cm Tube secured with: Tape Dental Injury: Teeth and Oropharynx as per pre-operative assessment

## 2017-01-25 ENCOUNTER — Encounter (HOSPITAL_COMMUNITY): Payer: Self-pay | Admitting: Obstetrics & Gynecology

## 2017-01-31 ENCOUNTER — Ambulatory Visit: Payer: Medicare Other | Admitting: Obstetrics & Gynecology

## 2017-02-11 ENCOUNTER — Ambulatory Visit (INDEPENDENT_AMBULATORY_CARE_PROVIDER_SITE_OTHER): Payer: Medicare Other | Admitting: Obstetrics & Gynecology

## 2017-02-11 ENCOUNTER — Ambulatory Visit (INDEPENDENT_AMBULATORY_CARE_PROVIDER_SITE_OTHER): Payer: Medicare Other | Admitting: Clinical

## 2017-02-11 ENCOUNTER — Encounter: Payer: Self-pay | Admitting: Obstetrics & Gynecology

## 2017-02-11 VITALS — BP 121/81 | HR 86 | Ht 62.0 in | Wt 242.9 lb

## 2017-02-11 DIAGNOSIS — N809 Endometriosis, unspecified: Secondary | ICD-10-CM | POA: Insufficient documentation

## 2017-02-11 DIAGNOSIS — F4310 Post-traumatic stress disorder, unspecified: Secondary | ICD-10-CM

## 2017-02-11 DIAGNOSIS — R102 Pelvic and perineal pain: Secondary | ICD-10-CM

## 2017-02-11 MED ORDER — MEDROXYPROGESTERONE ACETATE 10 MG PO TABS: 20.0000 mg | ORAL_TABLET | Freq: Every day | ORAL | 6 refills | Status: DC

## 2017-02-11 MED ORDER — LEUPROLIDE ACETATE (3 MONTH) 11.25 MG IM KIT
11.2500 mg | PACK | Freq: Once | INTRAMUSCULAR | Status: AC
  Administered 2017-02-11: 11.25 mg via INTRAMUSCULAR

## 2017-02-11 NOTE — BH Specialist Note (Signed)
Integrated Behavioral Health Initial Visit  MRN: [id number] Name: ZINIA INNOCENT   Session Start time: 11:00 Session End time: 11:30 Total time: 30 minutes  Type of Service: [place] Interpretor:No. Interpretor Name and Language: n/a   Warm Hand Off Completed.       SUBJECTIVE: TRENT THEISEN is a 41 y.o. female accompanied by patient. Patient was referred by Dr Roselie Awkward for depression and anxiety. Patient reports the following symptoms/concerns: Pt states that she has not been treated for her depression and anxiety in over three years, has panic attacks stemming from childhood trauma, and is open to strategy to cope with emotions until she is established with a routine therapist again. Pt copes best when she has others around her.  Duration of problem: Years; Severity of problem: severe  OBJECTIVE: Mood: Anxious and Depressed and Affect: Depressed Risk of harm to self or others: Suicidal ideation No plan to harm self or others, no SI today, but history of SI years ago   LIFE CONTEXT: Family and Social: Lives with daughter and grandson; has 2 grown daughters and 4 grandchildren School/Work: - Self-Care: Spending time with family Life Changes: -  GOALS ADDRESSED: Patient will reduce symptoms of: anxiety and depression and increase knowledge and/or ability of: self-management skills    INTERVENTIONS: Mindfulness or Psychologist, educational and Psychoeducation and/or Health Education  Standardized Assessments completed: GAD-7 and PHQ 9  ASSESSMENT: Patient currently experiencing Post-traumatic stress disorder. Patient may benefit from psychoeducation and brief therapeutic interventions regarding coping with symptoms of anxiety and depression, stemming from PTSD, along with referral to psychiatry and outpatient therapy.  PLAN: 1. Follow up with behavioral health clinician on : One week via phone follow-up 2. Behavioral recommendations:   -Re-establish care with psychiatry and therapy at [facility] -Daily CALM relaxation breathing exercises, as needed, throughout the day -Spend time with family daily -Follow Contract for Safety 3. Referral(s): [facility] (In Clinic) and Psychiatrist 4. "From scale of 1-10, how likely are you to follow plan?": [place] McMannes, LCSWA

## 2017-02-11 NOTE — Progress Notes (Signed)
Subjective:s/p L/S     Janice Roberts is a 41 y.o. female who presents to the clinic 3 weeks status post laparoscopy for pelvic pain. Eating a regular diet without difficulty. Bowel movements are normal. Pain is controlled without any medications.  The following portions of the patient's history were reviewed and updated as appropriate: allergies, current medications, past family history, past medical history, past social history, past surgical history and problem list.  Review of Systems Pertinent items are noted in HPI.    Objective:    BP 121/81   Pulse 86   Ht 5\' 2"  (1.575 m)   Wt 242 lb 14.4 oz (110.2 kg)   LMP 01/18/2017 (Exact Date)   BMI 44.43 kg/m  General:  alert, cooperative and no distress  Abdomen: soft, non-tender  Incision:   healing well, no drainage, no erythema, no hernia, no seroma, no swelling, no dehiscence, incision well approximated     Assessment:    Doing well postoperatively. Operative findings again reviewed. Pathology report discussed.    Patient Active Problem List   Diagnosis Date Noted  . Endometriosis determined by laparoscopy 02/11/2017  . PID (acute pelvic inflammatory disease) 11/17/2016  . Pelvic pain 11/15/2016  . Family history of breast cancer in mother 10/21/2016  . Pelvic pain in female 11/10/2015  . Anxiety   . Depression   . Asthma   . Obesity   . Hypertension   . Thyroid disease     Plan:    1. Continue any current medications. Lupron depot 11.25 mg for endometriosis for 6 months 2. Wound care discussed. 3. Activity restrictions: none 4. Anticipated return to work: now. 5. Follow up: mo  for Lupron  Woodroe Mode, MD 02/11/2017

## 2017-02-11 NOTE — Patient Instructions (Addendum)
My Safety Plan:   Step 1: Warning signs (thoughts, images, mood, situation, behavior) that a crisis may be developing: Starting to cry  Step 2: Internal coping strategies: Things I can do to take my mind off my problems without contacting another person (music, relaxation technique, physical activity): Take a walk, listen to Roe Rutherford  Step 3: People and social settings that provide distraction:  Name and contact: Gerald Dexter Name and contact: Graciella Belton, [phone]  Step 4: People I can ask for help:  Name, relationship, contact: Graciella Belton, [phone] Name, relationship, contact: Daughters Autie and Gar Gibbon, [phone] Name, relationship, contact: [facility](Randy) [phone]  Local urgent care services:      1. 9-1-1     2. [facility] (24/7 walk-in) [address] [place], [place]     3. [facility]: Intake- [phone]/ [phone]  Closest Urgent Care/Emergency Room Address:  Su[facility] Phone: [phone]  Step 6: Making the environment safe- Have friend/family member remove from home: Daughter Lind * Weapons in the home * Medication in the home (including Tylenol)  Step 7: The one thing that is most important to me and worth living for is: children and grandchildren  Signature of Patient: _________________________________________________  Signature of Provider: ________________________________________________

## 2017-02-18 ENCOUNTER — Telehealth: Payer: Self-pay | Admitting: Clinical

## 2017-02-18 NOTE — Telephone Encounter (Signed)
Left HIPPA-Compliant message to return call to [facility] from [facility] at [phone].   Attempt to f/u on patient, to confirm she has re-established psychiatric/therapeutic care with [facility], [address] [place], [place], [place], [place].

## 2017-02-24 ENCOUNTER — Telehealth: Payer: Self-pay | Admitting: Clinical

## 2017-02-24 NOTE — Telephone Encounter (Signed)
Janice Roberts says she is waiting for an appointment with [name], and agrees to answer phone in one week, from [facility] at [facility] for [facility], at [phone], to confirm that she has re-established care with psychiatry. Pt feels "a little groggy" today, but feels fine, and has no current concerns.

## 2017-03-04 ENCOUNTER — Telehealth: Payer: Self-pay | Admitting: Clinical

## 2017-03-04 NOTE — Telephone Encounter (Signed)
Neither pt, nor [facility], has received call-back from [place], so Ms. Pavlock agrees to call [facility] of the [place] at [phone] and/or go to walk-in clinic at [address] [place], [place], to establish care as soon as she is able. [facility] will call back in one week to follow-up; pt agrees to this plan.

## 2017-03-10 ENCOUNTER — Telehealth: Payer: Self-pay | Admitting: Clinical

## 2017-03-10 NOTE — Telephone Encounter (Signed)
Follow-up with patient concerning establishing care with [facility]. Ms. Kandler was unable to establish care with [facility] in the past week because she was [place], tending to a family loss, and in grieving, but agrees to continue plan to establish care in upcoming week.

## 2017-03-24 ENCOUNTER — Telehealth: Payer: Self-pay | Admitting: Clinical

## 2017-03-24 NOTE — Telephone Encounter (Signed)
Left HIPPA-compliant message to return call to [place] [facility] at [facility] at [phone].      

## 2017-04-03 ENCOUNTER — Inpatient Hospital Stay (HOSPITAL_COMMUNITY)
Admission: AD | Admit: 2017-04-03 | Discharge: 2017-04-03 | Disposition: A | Discharge status: Home / Self Care | Payer: Medicare Other | Source: Ambulatory Visit | Attending: Obstetrics and Gynecology | Admitting: Obstetrics and Gynecology

## 2017-04-03 ENCOUNTER — Encounter (HOSPITAL_COMMUNITY): Payer: Self-pay | Admitting: *Deleted

## 2017-04-03 DIAGNOSIS — Z9889 Other specified postprocedural states: Secondary | ICD-10-CM | POA: Insufficient documentation

## 2017-04-03 DIAGNOSIS — Z825 Family history of asthma and other chronic lower respiratory diseases: Secondary | ICD-10-CM | POA: Insufficient documentation

## 2017-04-03 DIAGNOSIS — J45909 Unspecified asthma, uncomplicated: Secondary | ICD-10-CM | POA: Diagnosis not present

## 2017-04-03 DIAGNOSIS — Z79891 Long term (current) use of opiate analgesic: Secondary | ICD-10-CM | POA: Diagnosis not present

## 2017-04-03 DIAGNOSIS — Z803 Family history of malignant neoplasm of breast: Secondary | ICD-10-CM | POA: Diagnosis not present

## 2017-04-03 DIAGNOSIS — F329 Major depressive disorder, single episode, unspecified: Secondary | ICD-10-CM | POA: Insufficient documentation

## 2017-04-03 DIAGNOSIS — Z79899 Other long term (current) drug therapy: Secondary | ICD-10-CM | POA: Diagnosis not present

## 2017-04-03 DIAGNOSIS — Z6841 Body Mass Index (BMI) 40.0 and over, adult: Secondary | ICD-10-CM | POA: Insufficient documentation

## 2017-04-03 DIAGNOSIS — R102 Pelvic and perineal pain: Secondary | ICD-10-CM | POA: Diagnosis not present

## 2017-04-03 DIAGNOSIS — G8929 Other chronic pain: Secondary | ICD-10-CM | POA: Insufficient documentation

## 2017-04-03 DIAGNOSIS — Z880 Allergy status to penicillin: Secondary | ICD-10-CM | POA: Insufficient documentation

## 2017-04-03 DIAGNOSIS — F419 Anxiety disorder, unspecified: Secondary | ICD-10-CM | POA: Insufficient documentation

## 2017-04-03 DIAGNOSIS — I10 Essential (primary) hypertension: Secondary | ICD-10-CM | POA: Insufficient documentation

## 2017-04-03 DIAGNOSIS — E669 Obesity, unspecified: Secondary | ICD-10-CM | POA: Diagnosis not present

## 2017-04-03 LAB — CBC
HCT: 36.7 % (ref 36.0–46.0)
Hemoglobin: 12.4 g/dL (ref 12.0–15.0)
MCH: 28.8 pg (ref 26.0–34.0)
MCHC: 33.8 g/dL (ref 30.0–36.0)
MCV: 85.2 fL (ref 78.0–100.0)
PLATELETS: 257 10*3/uL (ref 150–400)
RBC: 4.31 MIL/uL (ref 3.87–5.11)
RDW: 14.9 % (ref 11.5–15.5)
WBC: 8.4 10*3/uL (ref 4.0–10.5)

## 2017-04-03 LAB — URINALYSIS, ROUTINE W REFLEX MICROSCOPIC
Bilirubin Urine: NEGATIVE
Glucose, UA: NEGATIVE mg/dL
Hgb urine dipstick: NEGATIVE
KETONES UR: NEGATIVE mg/dL
LEUKOCYTES UA: NEGATIVE
NITRITE: NEGATIVE
PH: 6 (ref 5.0–8.0)
Protein, ur: NEGATIVE mg/dL
SPECIFIC GRAVITY, URINE: 1.012 (ref 1.005–1.030)

## 2017-04-03 LAB — POCT PREGNANCY, URINE: PREG TEST UR: NEGATIVE

## 2017-04-03 MED ORDER — KETOROLAC TROMETHAMINE 60 MG/2ML IM SOLN
60.0000 mg | Freq: Once | INTRAMUSCULAR | Status: AC
  Administered 2017-04-03: 60 mg via INTRAMUSCULAR
  Filled 2017-04-03: qty 2

## 2017-04-03 MED ORDER — TRAMADOL HCL 50 MG PO TABS: 100.0000 mg | ORAL_TABLET | Freq: Once | ORAL | 0 refills | Status: DC

## 2017-04-03 MED ORDER — TRAMADOL HCL 50 MG PO TABS
100.0000 mg | ORAL_TABLET | Freq: Once | ORAL | Status: AC
  Administered 2017-04-03: 100 mg via ORAL
  Filled 2017-04-03: qty 2

## 2017-04-03 MED ORDER — TRAMADOL HCL 50 MG PO TABS: 100.0000 mg | ORAL_TABLET | Freq: Four times a day (QID) | ORAL | 0 refills | Status: DC | PRN

## 2017-04-03 NOTE — MAU Note (Signed)
Dx with endometriosis a month ago. Have had abd pain a wk but worse today. Pressure in lower abd worse when I pee. Given a shot about 2 months ago to help with pain that I am to get every 3 months. No vag d/c

## 2017-04-03 NOTE — Progress Notes (Signed)
Pain went to a 10 when had bimanual exam and at 7 now.

## 2017-04-03 NOTE — Progress Notes (Signed)
Noni Saupe NP notified of pt's admission and status. Aware of hx of endometriosis dx in March 2018 by laparoscopy. U/A and upt negative. Will see pt

## 2017-04-03 NOTE — Discharge Instructions (Signed)
Pelvic Pain, Female °Pelvic pain is pain in your lower belly (abdomen), below your belly button and between your hips. The pain may start suddenly (acute), keep coming back (recurring), or last a long time (chronic). Pelvic pain that lasts longer than six months is considered chronic. There are many causes of pelvic pain. Sometimes the cause of your pelvic pain is not known. °Follow these instructions at home: °· Take over-the-counter and prescription medicines only as told by your doctor. °· Rest as told by your doctor. °· Do not have sex it if hurts. °· Keep a journal of your pelvic pain. Write down: °¨ When the pain started. °¨ Where the pain is located. °¨ What seems to make the pain better or worse, such as food or your menstrual cycle. °¨ Any symptoms you have along with the pain. °· Keep all follow-up visits as told by your doctor. This is important. °Contact a doctor if: °· Medicine does not help your pain. °· Your pain comes back. °· You have new symptoms. °· You have unusual vaginal discharge or bleeding. °· You have a fever or chills. °· You are having a hard time pooping (constipation). °· You have blood in your pee (urine) or poop (stool). °· Your pee smells bad. °· You feel weak or lightheaded. °Get help right away if: °· You have sudden pain that is very bad. °· Your pain continues to get worse. °· You have very bad pain and also have any of the following symptoms: °¨ A fever. °¨ Feeling stick to your stomach (nausea). °¨ Throwing up (vomiting). °¨ Being very sweaty. °· You pass out (lose consciousness). °This information is not intended to replace advice given to you by your health care provider. Make sure you discuss any questions you have with your health care provider. °Document Released: 04/12/2008 Document Revised: 11/19/2015 Document Reviewed: 08/15/2015 °Elsevier Interactive Patient Education © 2017 [employer] ° °

## 2017-04-03 NOTE — Progress Notes (Signed)
Bimanual only  

## 2017-04-03 NOTE — MAU Provider Note (Signed)
History     CSN: [id number]  Arrival date and time: 04/03/17 0020   First Provider Initiated Contact with Patient 04/03/17 0116      Chief Complaint  Patient presents with  . Abdominal Pain   HPI    Ms.Janice Roberts is a 41 y.o. female [phone] non pregnant female with a history of endometriosis, appendectomy and fibroids here in MAU with pelvic pain. The pain starts in her lower abdomen and shoots down into her vagina. The pain worsened over 1 week ago, however she had had this pain for a long time.  She took ibuprofen 600 mg 2 days ago which only helped some. Denies vaginal bleeding. Denies vaginal discharge. Dr. Roselie Awkward did her surgery.  Not sexually active, last intercourse was December 2017.  OB History    Gravida Para Term Preterm AB Living   10 2     8 2    SAB TAB Ectopic Multiple Live Births   1 7     2       Past Medical History:  Diagnosis Date  . Anxiety   . Asthma   . Depression   . Hypertension   . Hypothyroidism   . Obesity   . Thyroid disease     Past Surgical History:  Procedure Laterality Date  . APPENDECTOMY    . CESAREAN SECTION    . [facility] OF UTERUS  08/08/2008 & 03/08/2013  . LAPAROSCOPY N/A 01/24/2017   Procedure: LAPAROSCOPY DIAGNOSTIC;  Surgeon: Woodroe Mode, MD;  Location: [facility];  Service: Gynecology;  Laterality: N/A;  . SHOULDER SURGERY Left   . THYROIDECTOMY      Family History  Problem Relation Age of Onset  . Cancer Mother 60       Breast  . Asthma Sister   . Asthma Brother   . Asthma Sister   . Birth defects Sister     Social History  Substance Use Topics  . Smoking status: Never Smoker  . Smokeless tobacco: Never Used  . Alcohol use No    Allergies:  Allergies  Allergen Reactions  . Ivp Dye [Iodinated Diagnostic Agents] Anaphylaxis    "Almost died"  . Penicillins Hives    Has patient had a PCN reaction causing immediate rash, facial/tongue/throat swelling, SOB or lightheadedness with hypotension:  No Has patient had a PCN reaction causing severe rash involving mucus membranes or skin necrosis: Yes Has patient had a PCN reaction that required hospitalization No Has patient had a PCN reaction occurring within the last 10 years: No If all of the above answers are "NO", then may proceed with Cephalosporin use.     Prescriptions Prior to Admission  Medication Sig Dispense Refill Last Dose  . amitriptyline (ELAVIL) 25 MG tablet Take 1 tablet (25 mg total) by mouth at bedtime. 30 tablet 3 Past Week at Unknown time  . benazepril (LOTENSIN) 10 MG tablet TAKE ONE TABLET BY MOUTH  DAILY 90 tablet 1 04/02/2017 at Unknown time  . ibuprofen (ADVIL,MOTRIN) 600 MG tablet Take 1 tablet (600 mg total) by mouth every 6 (six) hours as needed. (Patient taking differently: Take 600 mg by mouth every 6 (six) hours as needed for moderate pain. ) 30 tablet 1 Past Week at Unknown time  . levothyroxine (SYNTHROID, LEVOTHROID) 112 MCG tablet Take 1 tablet (112 mcg total) by mouth daily. 30 tablet 1 04/02/2017 at Unknown time  . medroxyPROGESTERone (PROVERA) 10 MG tablet Take 2 tablets (20 mg total) by mouth daily.  60 tablet 6 04/02/2017 at Unknown time  . oxyCODONE-acetaminophen (PERCOCET/ROXICET) 5-325 MG tablet Take 1-2 tablets by mouth every 6 (six) hours as needed. (Patient not taking: Reported on 02/11/2017) 12 tablet 0 Not Taking   Results for orders placed or performed during the hospital encounter of 04/03/17 (from the past 48 hour(s))  Urinalysis, Routine w reflex microscopic     Status: None   Collection Time: 04/03/17 12:35 AM  Result Value Ref Range   Color, Urine YELLOW YELLOW   APPearance CLEAR CLEAR   Specific Gravity, Urine 1.012 1.005 - 1.030   pH 6.0 5.0 - 8.0   Glucose, UA NEGATIVE NEGATIVE mg/dL   Hgb urine dipstick NEGATIVE NEGATIVE   Bilirubin Urine NEGATIVE NEGATIVE   Ketones, ur NEGATIVE NEGATIVE mg/dL   Protein, ur NEGATIVE NEGATIVE mg/dL   Nitrite NEGATIVE NEGATIVE   Leukocytes, UA  NEGATIVE NEGATIVE  Pregnancy, urine POC     Status: None   Collection Time: 04/03/17 12:56 AM  Result Value Ref Range   Preg Test, Ur NEGATIVE NEGATIVE    Comment:        THE SENSITIVITY OF THIS METHODOLOGY IS >24 mIU/mL   CBC     Status: None   Collection Time: 04/03/17  1:28 AM  Result Value Ref Range   WBC 8.4 4.0 - 10.5 K/uL   RBC 4.31 3.87 - 5.11 MIL/uL   Hemoglobin 12.4 12.0 - 15.0 g/dL   HCT 36.7 36.0 - 46.0 %   MCV 85.2 78.0 - 100.0 fL   MCH 28.8 26.0 - 34.0 pg   MCHC 33.8 30.0 - 36.0 g/dL   RDW 14.9 11.5 - 15.5 %   Platelets 257 150 - 400 K/uL    Review of Systems  Constitutional: Negative for fever.  Gastrointestinal: Negative for constipation, diarrhea, nausea and vomiting.  Genitourinary: Positive for pelvic pain. Negative for dysuria, vaginal bleeding and vaginal discharge.   Physical Exam   Blood pressure (!) 146/90, pulse 97, temperature 99.1 F (37.3 C), resp. rate 18, height 5\' 2"  (1.575 m), weight 242 lb (109.8 kg), last menstrual period 02/06/2017, SpO2 100 %.  Physical Exam  Constitutional: She is oriented to person, place, and time. She appears well-developed and well-nourished.  Non-toxic appearance. She does not have a sickly appearance. She does not appear ill. No distress.  Genitourinary:  Genitourinary Comments: Bimanual exam: Cervix closed, no CMT  Uterus with tenderness, normal size Difficult exam due to patient's discomfort  Generalized Adnexal tenderness  GC/Chlam, wet prep done Chaperone present for exam.   Neurological: She is alert and oriented to person, place, and time.  Skin: Skin is warm. She is not diaphoretic.  Psychiatric: Her behavior is normal.   MAU Course  Procedures  None  MDM  Patient declines STI testing> No intercourse in over one year.  Toradol 60 mg IM> no significant relief  Ultram 100 mg PO  Pain down to 3/10 Patient with history of uterine fibroids, likely degenerating fibroid at this time. WBC WNL, pain  decreased with above medication.   Assessment and Plan   A:  1. Pelvic pain   2. Chronic pelvic pain in female     P:  Discharge home in stable condition Pelvic US ordered outpatient Patient to call the [facility] to schedule follow up appointment after the [facility] calls to schedule [place] appointment/ time Rx: Ultram #6 no refill Continue Ibuprofen as directed on the bottle  Return to MAU if symptoms worsen  Lillyan Hitson, Artist Pais,  NP 04/03/2017 3:11 AM

## 2017-04-11 ENCOUNTER — Telehealth: Payer: Self-pay | Admitting: *Deleted

## 2017-04-11 ENCOUNTER — Ambulatory Visit (HOSPITAL_COMMUNITY)
Admission: RE | Admit: 2017-04-11 | Discharge: 2017-04-11 | Disposition: A | Discharge status: Home / Self Care | Payer: Medicare Other | Source: Ambulatory Visit | Attending: Obstetrics and Gynecology | Admitting: Obstetrics and Gynecology

## 2017-04-11 ENCOUNTER — Other Ambulatory Visit (HOSPITAL_COMMUNITY): Payer: Self-pay | Admitting: Obstetrics and Gynecology

## 2017-04-11 DIAGNOSIS — R102 Pelvic and perineal pain: Secondary | ICD-10-CM | POA: Diagnosis not present

## 2017-04-11 DIAGNOSIS — R109 Unspecified abdominal pain: Secondary | ICD-10-CM | POA: Diagnosis not present

## 2017-04-11 NOTE — Telephone Encounter (Signed)
-----   Message from Mallie Darting sent at 04/06/2017 11:17 AM EDT -----   ----- Message ----- From: Lezlie Lye, NP Sent: 04/03/2017   2:22 AM To: [facility] Admin Pool  Patient will need pelvic US, she has been seen in the [facility] by Dr. Roselie Awkward, and recently had surgery.

## 2017-04-11 NOTE — Telephone Encounter (Signed)
Per chart review [place] and follow up already scheduled. I called [facility] and verified she was aware of [place] appt and follow up . She states she is aware of both appointments.

## 2017-04-13 ENCOUNTER — Ambulatory Visit (INDEPENDENT_AMBULATORY_CARE_PROVIDER_SITE_OTHER): Payer: Medicare Other | Admitting: Obstetrics & Gynecology

## 2017-04-13 ENCOUNTER — Encounter: Payer: Self-pay | Admitting: Obstetrics & Gynecology

## 2017-04-13 VITALS — BP 138/83 | HR 67 | Wt 241.1 lb

## 2017-04-13 DIAGNOSIS — N809 Endometriosis, unspecified: Secondary | ICD-10-CM | POA: Diagnosis present

## 2017-04-13 DIAGNOSIS — R102 Pelvic and perineal pain: Secondary | ICD-10-CM

## 2017-04-13 MED ORDER — MEDROXYPROGESTERONE ACETATE 10 MG PO TABS: 20.0000 mg | ORAL_TABLET | Freq: Every day | ORAL | 6 refills | Status: DC

## 2017-04-13 MED ORDER — IBUPROFEN 800 MG PO TABS: 800.0000 mg | ORAL_TABLET | Freq: Three times a day (TID) | ORAL | 2 refills | Status: DC | PRN

## 2017-04-13 MED ORDER — AMITRIPTYLINE HCL 50 MG PO TABS: 50.0000 mg | ORAL_TABLET | Freq: Every day | ORAL | 5 refills | Status: DC

## 2017-04-13 NOTE — Patient Instructions (Signed)
Endometriosis Endometriosis is a condition in which the tissue that lines the uterus (endometrium) grows outside of its normal location. The tissue may grow in many locations close to the uterus, but it commonly grows on the ovaries, fallopian tubes, vagina, or bowel. When the uterus sheds the endometrium every menstrual cycle, there is bleeding wherever the endometrial tissue is located. This can cause pain because blood is irritating to tissues that are not normally exposed to it. What are the causes? The cause of endometriosis is not known. What increases the risk? You may be more likely to develop endometriosis if you:  Have a family history of endometriosis.  Have never given birth.  Started your period at age 10 or younger.  Have high levels of estrogen in your body.  Were exposed to a certain medicine (diethylstilbestrol) before you were born (in utero).  Had low birth weight.  Were born as a twin, triplet, or other multiple.  Have a BMI of less than 25. BMI is an estimate of body fat and is calculated from height and weight.  What are the signs or symptoms? Often, there are no symptoms of this condition. If you do have symptoms, they may:  Vary depending on where your endometrial tissue is growing.  Occur during your menstrual period (most common) or midcycle.  Come and go, or you may go months with no symptoms at all.  Stop with menopause.  Symptoms may include:  Pain in the back or abdomen.  Heavier bleeding during periods.  Pain during sex.  Painful bowel movements.  Infertility.  Pelvic pain.  Bleeding more than once a month.  How is this diagnosed? This condition is diagnosed based on your symptoms and a physical exam. You may have tests, such as:  Blood tests and urine tests. These may be done to help rule out other possible causes of your symptoms.  Ultrasound, to look for abnormal tissues.  An X-ray of the lower bowel (barium enema).  An  ultrasound that is done through the vagina (transvaginally).  CT scan.  MRI.  Laparoscopy. In this procedure, a lighted, pencil-sized instrument called a laparoscope is inserted into your abdomen through an incision. The laparoscope allows your health care provider to look at the organs inside your body and check for abnormal tissue to confirm the diagnosis. If abnormal tissue is found, your health care provider may remove a small piece of tissue (biopsy) to be examined under a microscope.  How is this treated? Treatment for this condition may include:  Medicines to relieve pain, such as NSAIDs.  Hormone therapy. This involves using artificial (synthetic) hormones to reduce endometrial tissue growth. Your health care provider may recommend using a hormonal form of birth control, or other medicines.  Surgery. This may be done to remove abnormal endometrial tissue. ? In some cases, tissue may be removed using a laparoscope and a laser (laparoscopic laser treatment). ? In severe cases, surgery may be done to remove the fallopian tubes, uterus, and ovaries (hysterectomy).  Follow these instructions at home:  Take over-the-counter and prescription medicines only as told by your health care provider.  Do not drive or use heavy machinery while taking prescription pain medicine.  Try to avoid activities that cause pain, including sexual activity.  Keep all follow-up visits as told by your health care provider. This is important. Contact a health care provider if:  You have pain in the area between your hip bones (pelvic area) that occurs: ? Before, during, or   after your period. ? In between your period and gets worse during your period. ? During or after sex. ? With bowel movements or urination, especially during your period.  You have problems getting pregnant.  You have a fever. Get help right away if:  You have severe pain that does not get better with medicine.  You have severe  nausea and vomiting, or you cannot eat without vomiting.  You have pain that affects only the lower, right side of your abdomen.  You have abdominal pain that gets worse.  You have abdominal swelling.  You have blood in your stool. This information is not intended to replace advice given to you by your health care provider. Make sure you discuss any questions you have with your health care provider. Document Released: 10/22/2000 Document Revised: 07/30/2016 Document Reviewed: 03/27/2016 Elsevier Interactive Patient Education  2018 Elsevier Inc.  

## 2017-04-13 NOTE — Progress Notes (Signed)
Subjective:     Patient ID: Janice Roberts, female   DOB: 05/26/1976, 41 y.o.   MRN: [id number] Cc: daily pelvic pain [id number] No LMP recorded. H/o laparoscopically diagnosed endometriosis who received Lupron 4/6, still with daily pelvic pain and sleeps poorly, not much relief from ibuprofen or Ultram. Allergies  Allergen Reactions  . Ivp Dye [Iodinated Diagnostic Agents] Anaphylaxis    "Almost died"  . Penicillins Hives    Has patient had a PCN reaction causing immediate rash, facial/tongue/throat swelling, SOB or lightheadedness with hypotension: No Has patient had a PCN reaction causing severe rash involving mucus membranes or skin necrosis: Yes Has patient had a PCN reaction that required hospitalization No Has patient had a PCN reaction occurring within the last 10 years: No If all of the above answers are "NO", then may proceed with Cephalosporin use.    Current Outpatient Prescriptions on File Prior to Visit  Medication Sig Dispense Refill  . benazepril (LOTENSIN) 10 MG tablet TAKE ONE TABLET BY MOUTH  DAILY 90 tablet 1  . levothyroxine (SYNTHROID, LEVOTHROID) 112 MCG tablet Take 1 tablet (112 mcg total) by mouth daily. 30 tablet 1  . traMADol (ULTRAM) 50 MG tablet Take 2 tablets (100 mg total) by mouth every 6 (six) hours as needed. (Patient not taking: Reported on 04/13/2017) 6 tablet 0   No current facility-administered medications on file prior to visit.       Review of Systems  Constitutional: Positive for fatigue.  Gastrointestinal: Positive for rectal pain.  Genitourinary: Positive for dysuria and pelvic pain. Negative for frequency.  Psychiatric/Behavioral: Positive for sleep disturbance.       Objective:   Physical Exam  Constitutional: She is oriented to person, place, and time. She appears well-developed. No distress.  Cardiovascular: Normal rate.   Pulmonary/Chest: Effort normal. No respiratory distress.  Abdominal: Soft. There is no tenderness.   Neurological: She is alert and oriented to person, place, and time.  Psychiatric: She has a normal mood and affect. Her behavior is normal.  Vitals reviewed.      Assessment:     Second month of hormonal therapy for endometriosis without relief     Plan:     Agrees to modify medical therapy and return for Lupron in 1 month Discussed surgical management and extent of pain relief long tern is not guaranteed  Woodroe Mode, MD 04/13/2017

## 2017-05-12 ENCOUNTER — Telehealth: Payer: Self-pay | Admitting: General Practice

## 2017-05-12 NOTE — Telephone Encounter (Signed)
Patient called and left message stating she is calling to confirm her appointment for Friday. Called patient, no answer- left message stating we are calling to confirm her appt for tomorrow at 1015.

## 2017-05-13 ENCOUNTER — Encounter (INDEPENDENT_AMBULATORY_CARE_PROVIDER_SITE_OTHER): Payer: Medicare Other | Admitting: Obstetrics & Gynecology

## 2017-05-13 VITALS — BP 128/89 | HR 92 | Ht 62.0 in | Wt 239.0 lb

## 2017-05-13 DIAGNOSIS — N809 Endometriosis, unspecified: Secondary | ICD-10-CM

## 2017-05-13 MED ORDER — LEUPROLIDE ACETATE (3 MONTH) 11.25 MG IM KIT
11.2500 mg | PACK | Freq: Once | INTRAMUSCULAR | Status: AC
  Administered 2017-05-13: 11.25 mg via INTRAMUSCULAR

## 2017-05-14 NOTE — Progress Notes (Signed)
This encounter was created in error - please disregard.

## 2017-05-18 ENCOUNTER — Other Ambulatory Visit: Payer: Self-pay | Admitting: Physician Assistant

## 2017-05-18 DIAGNOSIS — E079 Disorder of thyroid, unspecified: Secondary | ICD-10-CM

## 2017-05-18 DIAGNOSIS — I10 Essential (primary) hypertension: Secondary | ICD-10-CM

## 2017-05-19 NOTE — Telephone Encounter (Signed)
Refill appropriate 

## 2017-06-29 ENCOUNTER — Encounter: Payer: Self-pay | Admitting: Obstetrics & Gynecology

## 2017-06-29 ENCOUNTER — Ambulatory Visit (INDEPENDENT_AMBULATORY_CARE_PROVIDER_SITE_OTHER): Payer: Medicare Other | Admitting: Obstetrics & Gynecology

## 2017-06-29 VITALS — BP 147/91 | HR 71 | Wt 243.5 lb

## 2017-06-29 DIAGNOSIS — N809 Endometriosis, unspecified: Secondary | ICD-10-CM | POA: Diagnosis not present

## 2017-06-29 MED ORDER — LEUPROLIDE ACETATE (3 MONTH) 11.25 MG IM KIT
11.2500 mg | PACK | Freq: Once | INTRAMUSCULAR | Status: AC
  Administered 2017-08-12: 11.25 mg via INTRAMUSCULAR

## 2017-06-29 NOTE — Patient Instructions (Signed)
Endometriosis Endometriosis is a condition in which the tissue that lines the uterus (endometrium) grows outside of its normal location. The tissue may grow in many locations close to the uterus, but it commonly grows on the ovaries, fallopian tubes, vagina, or bowel. When the uterus sheds the endometrium every menstrual cycle, there is bleeding wherever the endometrial tissue is located. This can cause pain because blood is irritating to tissues that are not normally exposed to it. What are the causes? The cause of endometriosis is not known. What increases the risk? You may be more likely to develop endometriosis if you:  Have a family history of endometriosis.  Have never given birth.  Started your period at age 10 or younger.  Have high levels of estrogen in your body.  Were exposed to a certain medicine (diethylstilbestrol) before you were born (in utero).  Had low birth weight.  Were born as a twin, triplet, or other multiple.  Have a BMI of less than 25. BMI is an estimate of body fat and is calculated from height and weight.  What are the signs or symptoms? Often, there are no symptoms of this condition. If you do have symptoms, they may:  Vary depending on where your endometrial tissue is growing.  Occur during your menstrual period (most common) or midcycle.  Come and go, or you may go months with no symptoms at all.  Stop with menopause.  Symptoms may include:  Pain in the back or abdomen.  Heavier bleeding during periods.  Pain during sex.  Painful bowel movements.  Infertility.  Pelvic pain.  Bleeding more than once a month.  How is this diagnosed? This condition is diagnosed based on your symptoms and a physical exam. You may have tests, such as:  Blood tests and urine tests. These may be done to help rule out other possible causes of your symptoms.  Ultrasound, to look for abnormal tissues.  An X-ray of the lower bowel (barium enema).  An  ultrasound that is done through the vagina (transvaginally).  CT scan.  MRI.  Laparoscopy. In this procedure, a lighted, pencil-sized instrument called a laparoscope is inserted into your abdomen through an incision. The laparoscope allows your health care provider to look at the organs inside your body and check for abnormal tissue to confirm the diagnosis. If abnormal tissue is found, your health care provider may remove a small piece of tissue (biopsy) to be examined under a microscope.  How is this treated? Treatment for this condition may include:  Medicines to relieve pain, such as NSAIDs.  Hormone therapy. This involves using artificial (synthetic) hormones to reduce endometrial tissue growth. Your health care provider may recommend using a hormonal form of birth control, or other medicines.  Surgery. This may be done to remove abnormal endometrial tissue. ? In some cases, tissue may be removed using a laparoscope and a laser (laparoscopic laser treatment). ? In severe cases, surgery may be done to remove the fallopian tubes, uterus, and ovaries (hysterectomy).  Follow these instructions at home:  Take over-the-counter and prescription medicines only as told by your health care provider.  Do not drive or use heavy machinery while taking prescription pain medicine.  Try to avoid activities that cause pain, including sexual activity.  Keep all follow-up visits as told by your health care provider. This is important. Contact a health care provider if:  You have pain in the area between your hip bones (pelvic area) that occurs: ? Before, during, or   after your period. ? In between your period and gets worse during your period. ? During or after sex. ? With bowel movements or urination, especially during your period.  You have problems getting pregnant.  You have a fever. Get help right away if:  You have severe pain that does not get better with medicine.  You have severe  nausea and vomiting, or you cannot eat without vomiting.  You have pain that affects only the lower, right side of your abdomen.  You have abdominal pain that gets worse.  You have abdominal swelling.  You have blood in your stool. This information is not intended to replace advice given to you by your health care provider. Make sure you discuss any questions you have with your health care provider. Document Released: 10/22/2000 Document Revised: 07/30/2016 Document Reviewed: 03/27/2016 Elsevier Interactive Patient Education  2018 Elsevier Inc.  

## 2017-06-29 NOTE — Progress Notes (Signed)
Subjective:     Patient ID: Janice Roberts, female   DOB: 14-Nov-1975, 41 y.o.   MRN: [id number] Cc: f/u endometriosis [id number] No LMP recorded. Amenorrhea for 4-5 months on Lupron. Still noted cyclic pain but much better. She has low back pain after sitting long periods and low abdominal pain from prolonged standing. Past Medical History:  Diagnosis Date  . Anxiety   . Asthma   . Depression   . Hypertension   . Hypothyroidism   . Obesity   . Thyroid disease    Past Surgical History:  Procedure Laterality Date  . APPENDECTOMY    . CESAREAN SECTION    . [facility] OF UTERUS  08/08/2008 & 03/08/2013  . LAPAROSCOPY N/A 01/24/2017   Procedure: LAPAROSCOPY DIAGNOSTIC;  Surgeon: Woodroe Mode, MD;  Location: [facility];  Service: Gynecology;  Laterality: N/A;  . SHOULDER SURGERY Left   . THYROIDECTOMY     Allergies  Allergen Reactions  . Ivp Dye [Iodinated Diagnostic Agents] Anaphylaxis    "Almost died"  . Penicillins Hives    Has patient had a PCN reaction causing immediate rash, facial/tongue/throat swelling, SOB or lightheadedness with hypotension: No Has patient had a PCN reaction causing severe rash involving mucus membranes or skin necrosis: Yes Has patient had a PCN reaction that required hospitalization No Has patient had a PCN reaction occurring within the last 10 years: No If all of the above answers are "NO", then may proceed with Cephalosporin use.      Review of Systems  Constitutional: Negative.   Gastrointestinal: Positive for abdominal pain.  Genitourinary: Positive for pelvic pain. Negative for menstrual problem.  Musculoskeletal: Positive for back pain.       Objective:   Physical Exam  Constitutional: She is oriented to person, place, and time. She appears well-developed. No distress.  obese  Abdominal: She exhibits no distension.  Musculoskeletal: Normal range of motion. She exhibits no tenderness or deformity.  Neurological: She is alert and  oriented to person, place, and time.  Psychiatric: She has a normal mood and affect. Her behavior is normal.  Vitals reviewed.  Blood pressure (!) 147/91, pulse 71, weight 243 lb 8 oz (110.5 kg).     Assessment:     Patient Active Problem List   Diagnosis Date Noted  . Endometriosis determined by laparoscopy 02/11/2017  . PID (acute pelvic inflammatory disease) 11/17/2016  . Pelvic pain 11/15/2016  . Family history of breast cancer in mother 10/21/2016  . Pelvic pain in female 11/10/2015  . Anxiety   . Depression   . Asthma   . Obesity   . Hypertension   . Thyroid disease    Back pain    Plan:     Consider chiropractic for MS pain RTC for Lupron in 6 weeks for last injection  Woodroe Mode, MD 06/29/2017

## 2017-07-06 ENCOUNTER — Ambulatory Visit (INDEPENDENT_AMBULATORY_CARE_PROVIDER_SITE_OTHER): Payer: Medicare Other | Admitting: Physician Assistant

## 2017-07-06 ENCOUNTER — Encounter: Payer: Self-pay | Admitting: Physician Assistant

## 2017-07-06 VITALS — BP 138/100 | HR 92 | Temp 98.4°F | Resp 16 | Ht 62.0 in | Wt 244.4 lb

## 2017-07-06 DIAGNOSIS — E079 Disorder of thyroid, unspecified: Secondary | ICD-10-CM

## 2017-07-06 DIAGNOSIS — I10 Essential (primary) hypertension: Secondary | ICD-10-CM

## 2017-07-06 MED ORDER — BENAZEPRIL HCL 20 MG PO TABS: 20.0000 mg | ORAL_TABLET | Freq: Every day | ORAL | 0 refills | Status: DC

## 2017-07-06 NOTE — Progress Notes (Signed)
Patient ID: Janice Roberts MRN: [id number], DOB: 07-20-1976, 41 y.o. Date of Encounter: @DATE @  Chief Complaint:  Chief Complaint  Patient presents with  . Headache    on and off   . Hypertension  . Fatigue    HPI: 41 y.o. year old female  presents with above.   She states that she thinks that her high blood pressure is causing her to have this mild/dull headache. Says that when she went to her OB/GYN they noted the blood pressure was high. She has not had it checked elsewhere/has not checked at home. She reports that she is taking the benazepril 10 mg daily and is compliant with this. She reports that she is also taking the thyroid medication daily and is also compliant with this.  Also reviewed that depression screening today did show significant depression score. Reviewed that we had discussed long history of depression/psych disorder at prior visit. Today I asked her if she ever had follow-up with psychiatry. I reviewed that she did have a behavioral health note by staff at her OB/GYN office on 02/11/17 and that note also stated to follow-up with a psychiatrist and a psychologist. Today I asked patient where things stood with that and whether she did have follow-up. Says that they had given her a number to contact for a psychiatrist but she then had a contact that person again to find somebody that will take Medicare. Says that she is still waiting to hear back from them to find someone who takes Medicare. Says that if I can provide her someone takes Medicare that would be helpful. Otherwise says that her depression symptoms are stable. She denies any suicidal or homicidal ideation.   Past Medical History:  Diagnosis Date  . Anxiety   . Asthma   . Depression   . Hypertension   . Hypothyroidism   . Obesity   . Thyroid disease      Home Meds: Outpatient Medications Prior to Visit  Medication Sig Dispense Refill  . amitriptyline (ELAVIL) 50 MG tablet Take 1 tablet (50 mg  total) by mouth at bedtime. 30 tablet 5  . ibuprofen (ADVIL,MOTRIN) 800 MG tablet Take 1 tablet (800 mg total) by mouth every 8 (eight) hours as needed. 90 tablet 2  . levothyroxine (SYNTHROID, LEVOTHROID) 112 MCG tablet TAKE ONE TABLET BY MOUTH ONCE DAILY 30 tablet 0  . medroxyPROGESTERone (PROVERA) 10 MG tablet Take 2 tablets (20 mg total) by mouth daily. 60 tablet 6  . benazepril (LOTENSIN) 10 MG tablet TAKE 1 TABLET BY MOUTH ONCE DAILY 90 tablet 1  . traMADol (ULTRAM) 50 MG tablet Take 2 tablets (100 mg total) by mouth every 6 (six) hours as needed. (Patient not taking: Reported on 06/29/2017) 6 tablet 0   Facility-Administered Medications Prior to Visit  Medication Dose Route Frequency Provider Last Rate Last Dose  . leuprolide (LUPRON) injection 11.25 mg  11.25 mg Intramuscular Once Woodroe Mode, MD        Allergies:  Allergies  Allergen Reactions  . Ivp Dye [Iodinated Diagnostic Agents] Anaphylaxis    "Almost died"  . Penicillins Hives    Has patient had a PCN reaction causing immediate rash, facial/tongue/throat swelling, SOB or lightheadedness with hypotension: No Has patient had a PCN reaction causing severe rash involving mucus membranes or skin necrosis: Yes Has patient had a PCN reaction that required hospitalization No Has patient had a PCN reaction occurring within the last 10 years: No If all of the  above answers are "NO", then may proceed with Cephalosporin use.     Social History   Social History  . Marital status: Single    Spouse name: N/A  . Number of children: N/A  . Years of education: N/A   Occupational History  . Not on file.   Social History Main Topics  . Smoking status: Never Smoker  . Smokeless tobacco: Never Used  . Alcohol use No  . Drug use: No  . Sexual activity: Yes    Birth control/ protection: None   Other Topics Concern  . Not on file   Social History Narrative  . No narrative on file    Family History  Problem Relation Age  of Onset  . Cancer Mother 78       Breast  . Asthma Sister   . Asthma Brother   . Asthma Sister   . Birth defects Sister      Review of Systems:  See HPI for pertinent ROS. All other ROS negative.    Physical Exam: Blood pressure (!) 138/100, pulse 92, temperature 98.4 F (36.9 C), temperature source Oral, resp. rate 16, height 5\' 2"  (1.575 m), weight 244 lb 6.4 oz (110.9 kg), last menstrual period 03/02/2017, SpO2 98 %., Body mass index is 44.7 kg/m. General: Obese AAF. Appears in no acute distress. Neck: Supple. No thyromegaly. No lymphadenopathy. No carotid bruits. Lungs: Clear bilaterally to auscultation without wheezes, rales, or rhonchi. Breathing is unlabored. Heart: RRR with S1 S2. No murmurs, rubs, or gallops. Musculoskeletal:  Strength and tone normal for age. Extremities/Skin: Warm and dry. No lower extremity edema. Neuro: Alert and oriented X 3. Moves all extremities spontaneously. Gait is normal. CNII-XII grossly in tact. Psych:  Responds to questions appropriately with a normal affect.     ASSESSMENT AND PLAN:  41 y.o. year old female with  1. Essential hypertension Will check lab to monitor.  Will have her increase benazepril to 20 mg daily.  Will have her return to for office visit in 2 weeks to recheck BP and bmet after increasing medication dose. - BASIC METABOLIC PANEL WITH GFR - benazepril (LOTENSIN) 20 MG tablet; Take 1 tablet (20 mg total) by mouth daily.  Dispense: 30 tablet; Refill: 0  2. Thyroid disease She reports that she is taking her thyroid medication daily. Check lab to monitor. - TSH  3.Depression, PTSD Today I gave her phone number for [facility] [phone]. They accept Medicare. When patient returns for follow-up visit in 2 weeks I will follow up to see if she has scheduled this appointment and encouraged her to follow up on this.  Signed, [address] [place], [place], [facility] 07/06/2017 11:59 AM

## 2017-07-07 LAB — BASIC METABOLIC PANEL WITH GFR
BUN: 15 mg/dL (ref 7–25)
CALCIUM: 8.9 mg/dL (ref 8.6–10.2)
CO2: 21 mmol/L (ref 20–32)
CREATININE: 0.99 mg/dL (ref 0.50–1.10)
Chloride: 107 mmol/L (ref 98–110)
GFR, Est African American: 81 mL/min (ref >=60)
GFR, Est Non African American: 71 mL/min (ref >=60)
Glucose, Bld: 98 mg/dL (ref 70–99)
Potassium: 4.2 mmol/L (ref 3.5–5.3)
Sodium: 141 mmol/L (ref 135–146)

## 2017-07-07 LAB — TSH: TSH: 0.73 m[IU]/L

## 2017-07-13 ENCOUNTER — Other Ambulatory Visit: Payer: Self-pay | Admitting: Physician Assistant

## 2017-07-13 DIAGNOSIS — E079 Disorder of thyroid, unspecified: Secondary | ICD-10-CM

## 2017-07-20 ENCOUNTER — Ambulatory Visit (INDEPENDENT_AMBULATORY_CARE_PROVIDER_SITE_OTHER): Payer: Medicare Other | Admitting: Physician Assistant

## 2017-07-20 ENCOUNTER — Encounter: Payer: Self-pay | Admitting: Physician Assistant

## 2017-07-20 VITALS — BP 140/100 | HR 100 | Temp 99.2°F | Resp 18 | Ht 62.0 in | Wt 243.0 lb

## 2017-07-20 DIAGNOSIS — E079 Disorder of thyroid, unspecified: Secondary | ICD-10-CM

## 2017-07-20 DIAGNOSIS — I10 Essential (primary) hypertension: Secondary | ICD-10-CM | POA: Diagnosis not present

## 2017-07-20 LAB — BASIC METABOLIC PANEL WITH GFR
BUN: 11 mg/dL (ref 7–25)
CALCIUM: 9.4 mg/dL (ref 8.6–10.2)
CHLORIDE: 107 mmol/L (ref 98–110)
CO2: 26 mmol/L (ref 20–32)
Creat: 0.97 mg/dL (ref 0.50–1.10)
GFR, EST AFRICAN AMERICAN: 83 mL/min/{1.73_m2} (ref >=60)
GFR, Est Non African American: 72 mL/min/{1.73_m2} (ref >=60)
Glucose, Bld: 103 mg/dL — ABNORMAL HIGH (ref 65–99)
Potassium: 4.3 mmol/L (ref 3.5–5.3)
Sodium: 141 mmol/L (ref 135–146)

## 2017-07-20 MED ORDER — AMLODIPINE BESYLATE 5 MG PO TABS: 5.0000 mg | ORAL_TABLET | Freq: Every day | ORAL | 0 refills | Status: DC

## 2017-07-20 MED ORDER — LEVOTHYROXINE SODIUM 112 MCG PO TABS: ORAL_TABLET | ORAL | 1 refills | Status: DC

## 2017-07-20 NOTE — Progress Notes (Signed)
Patient ID: Janice Roberts MRN: [id number], DOB: [birth year], 41 y.o. Date of Encounter: @DATE @  Chief Complaint:  Chief Complaint  Patient presents with  . Hypertension    HPI: 41 y.o. year old female  presents with above.    07/06/2017:  She states that she thinks that her high blood pressure is causing her to have this mild/dull headache.  Says that when she went to her OB/GYN they noted the blood pressure was high.  She has not had it checked elsewhere/has not checked at home. She reports that she is taking the benazepril 10 mg daily and is compliant with this. She reports that she is also taking the thyroid medication daily and is also compliant with this.  Also reviewed that depression screening today did show significant depression score. Reviewed that we had discussed long history of depression/psych disorder at prior visit. Today I asked her if she ever had follow-up with psychiatry. I reviewed that she did have a behavioral health note by staff at her OB/GYN office on 02/11/17 and that note also stated to follow-up with a psychiatrist and a psychologist. Today I asked patient where things stood with that and whether she did have follow-up. Says that they had given her a number to contact for a psychiatrist but she then had a contact that person again to find somebody that will take Medicare. Says that she is still waiting to hear back from them to find someone who takes Medicare. Says that if I can provide her someone takes Medicare that would be helpful. Otherwise says that her depression symptoms are stable. She denies any suicidal or homicidal ideation.  AT THAT OV: --Increased benazepril to 20 mg daily.  --Planned F/U 2 weeks to recheck BP and bmet after increasing medication dose. --Checked TSH--was WNL--conted current dose -- I gave her phone number for [facility] [phone]. They accept Medicare. When patient returns for follow-up visit in 2 weeks I will  follow up to see if she has scheduled this appointment and encouraged her to follow up on this.   07/20/2017: Today she reports that she did increase the benazepril to 20 mg. She reports that she has not checked her blood pressure anywhere since last visit here.  Says that she has been out-of-town.  Later during the visit says that she was in [place].  Says she was visiting her fianc.  Says that it is a childhood sweetheart and "they recently found each other again".  Says that she was considering for her and her children to go move there but he thinks he may move here. Says that he recently retired from [employer]. At the end of the visit she even brings him to meet me--- she had told me that he was out in the lobby. Asked her if she followed up with the psychiatrist office. She says that this past Friday she called there and left them a message but has not heard back yet. If she does not hear from them in the next several days I have told her to call them back.   Past Medical History:  Diagnosis Date  . Anxiety   . Asthma   . Depression   . Hypertension   . Hypothyroidism   . Obesity   . Thyroid disease      Home Meds: Outpatient Medications Prior to Visit  Medication Sig Dispense Refill  . amitriptyline (ELAVIL) 50 MG tablet Take 1 tablet (50 mg total) by mouth at bedtime.  30 tablet 5  . benazepril (LOTENSIN) 20 MG tablet Take 1 tablet (20 mg total) by mouth daily. 30 tablet 0  . ibuprofen (ADVIL,MOTRIN) 800 MG tablet Take 1 tablet (800 mg total) by mouth every 8 (eight) hours as needed. 90 tablet 2  . levothyroxine (SYNTHROID, LEVOTHROID) 112 MCG tablet TAKE 1 TABLET BY MOUTH ONCE DAILY (NEED  OFFICE  VISIT  BEFORE  ANYMORE  REFILLS) 30 tablet 0  . medroxyPROGESTERone (PROVERA) 10 MG tablet Take 2 tablets (20 mg total) by mouth daily. 60 tablet 6   Facility-Administered Medications Prior to Visit  Medication Dose Route Frequency Provider Last Rate Last Dose  . leuprolide  (LUPRON) injection 11.25 mg  11.25 mg Intramuscular Once Woodroe Mode, MD        Allergies:  Allergies  Allergen Reactions  . Ivp Dye [Iodinated Diagnostic Agents] Anaphylaxis    "Almost died"  . Penicillins Hives    Has patient had a PCN reaction causing immediate rash, facial/tongue/throat swelling, SOB or lightheadedness with hypotension: No Has patient had a PCN reaction causing severe rash involving mucus membranes or skin necrosis: Yes Has patient had a PCN reaction that required hospitalization No Has patient had a PCN reaction occurring within the last 10 years: No If all of the above answers are "NO", then may proceed with Cephalosporin use.     Social History   Social History  . Marital status: Single    Spouse name: N/A  . Number of children: N/A  . Years of education: N/A   Occupational History  . Not on file.   Social History Main Topics  . Smoking status: Never Smoker  . Smokeless tobacco: Never Used  . Alcohol use No  . Drug use: No  . Sexual activity: Yes    Birth control/ protection: None   Other Topics Concern  . Not on file   Social History Narrative  . No narrative on file    Family History  Problem Relation Age of Onset  . Cancer Mother 85       Breast  . Asthma Sister   . Asthma Brother   . Asthma Sister   . Birth defects Sister      Review of Systems:  See HPI for pertinent ROS. All other ROS negative.    Physical Exam: Blood pressure (!) 140/100, pulse 100, temperature 99.2 F (37.3 C), temperature source Oral, resp. rate 18, height 5\' 2"  (1.575 m), weight 243 lb (110.2 kg), SpO2 98 %., Body mass index is 44.45 kg/m. General: Obese AAF. Appears in no acute distress. Neck: Supple. No thyromegaly. No lymphadenopathy. No carotid bruits. Lungs: Clear bilaterally to auscultation without wheezes, rales, or rhonchi. Breathing is unlabored. Heart: RRR with S1 S2. No murmurs, rubs, or gallops. Musculoskeletal:  Strength and tone normal  for age. Extremities/Skin: Warm and dry. No lower extremity edema. Neuro: Alert and oriented X 3. Moves all extremities spontaneously. Gait is normal. CNII-XII grossly in tact. Psych:  Responds to questions appropriately with a normal affect.     ASSESSMENT AND PLAN:  41 y.o. year old female with   1. Essential hypertension 07/20/2017: I rechecked blood pressure myself and I'm getting even higher than nurse blood pressure check. I'm getting right around 150/104.                   Will have her continue the benazepril 20 mg daily.        Will add Norvasc 5 mg daily.  Today will check bmet since increasing the benazepril dose at last visit.        Have her return for follow-up in 2 weeks to recheck blood pressure after addition of Norvasc.       - amLODipine (NORVASC) 5 MG tablet; Take 1 tablet (5 mg total) by mouth daily.  Dispense: 30 tablet; Refill: 0        - BASIC METABOLIC PANEL WITH GFR  2. Thyroid disease 07/20/2017: TSH was normal at recent lab. Continue current dose.           - levothyroxine (SYNTHROID, LEVOTHROID) 112 MCG tablet; TAKE 1 TABLET BY MOUTH ONCE DAILY  Dispense: 90 tablet; Refill: 1  3.Depression, PTSD 06/2017: Today I gave her phone number for [facility] [phone]. They accept Medicare. When patient returns for follow-up visit in 2 weeks I will follow up to see if she has scheduled this appointment and encouraged her to follow up on this. 07/20/2017: She reports that she did call there and left a message. Today told her that if she does not hear anything from them within 1 week to call them back again.   Signed, [address] [place], [place], [facility] 07/20/2017 11:22 AM

## 2017-08-03 ENCOUNTER — Ambulatory Visit (INDEPENDENT_AMBULATORY_CARE_PROVIDER_SITE_OTHER): Payer: Medicare Other | Admitting: Physician Assistant

## 2017-08-03 ENCOUNTER — Encounter: Payer: Self-pay | Admitting: Physician Assistant

## 2017-08-03 VITALS — BP 124/90 | HR 79 | Temp 98.4°F | Resp 16 | Ht 62.0 in | Wt 240.8 lb

## 2017-08-03 DIAGNOSIS — I10 Essential (primary) hypertension: Secondary | ICD-10-CM | POA: Diagnosis not present

## 2017-08-03 MED ORDER — AMLODIPINE BESYLATE 5 MG PO TABS: 5.0000 mg | ORAL_TABLET | Freq: Every day | ORAL | 2 refills | Status: DC

## 2017-08-03 NOTE — Progress Notes (Signed)
Patient ID: Janice Roberts MRN: [id number], DOB: Jan 09, 1976, 41 y.o. Date of Encounter: @DATE @  Chief Complaint:  Chief Complaint  Patient presents with  . 2week f/u    HPI: 41 y.o. year old female  presents with above.    07/06/2017:  She states that she thinks that her high blood pressure is causing her to have this mild/dull headache.  Says that when she went to her OB/GYN they noted the blood pressure was high.  She has not had it checked elsewhere/has not checked at home. She reports that she is taking the benazepril 10 mg daily and is compliant with this. She reports that she is also taking the thyroid medication daily and is also compliant with this.  Also reviewed that depression screening today did show significant depression score. Reviewed that we had discussed long history of depression/psych disorder at prior visit. Today I asked her if she ever had follow-up with psychiatry. I reviewed that she did have a behavioral health note by staff at her OB/GYN office on 02/11/17 and that note also stated to follow-up with a psychiatrist and a psychologist. Today I asked patient where things stood with that and whether she did have follow-up. Says that they had given her a number to contact for a psychiatrist but she then had a contact that person again to find somebody that will take Medicare. Says that she is still waiting to hear back from them to find someone who takes Medicare. Says that if I can provide her someone takes Medicare that would be helpful. Otherwise says that her depression symptoms are stable. She denies any suicidal or homicidal ideation.  AT THAT OV: --Increased benazepril to 20 mg daily.  --Planned F/U 2 weeks to recheck BP and bmet after increasing medication dose. --Checked TSH--was WNL--conted current dose -- I gave her phone number for [facility] [phone]. They accept Medicare. When patient returns for follow-up visit in 2 weeks I will follow  up to see if she has scheduled this appointment and encouraged her to follow up on this.   07/20/2017: Today she reports that she did increase the benazepril to 20 mg. She reports that she has not checked her blood pressure anywhere since last visit here.  Says that she has been out-of-town.  Later during the visit says that she was in [place].  Says she was visiting her fianc.  Says that it is a childhood sweetheart and "they recently found each other again".  Says that she was considering for her and her children to go move there but he thinks he may move here. Says that he recently retired from [employer]. At the end of the visit she even brings him to meet me--- she had told me that he was out in the lobby. Asked her if she followed up with the psychiatrist office. She says that this past Friday she called there and left them a message but has not heard back yet. If she does not hear from them in the next several days I have told her to call them back.  --------A/P AT THAT OV:----------------- 1. Essential hypertension 07/20/2017: I rechecked blood pressure myself and I'm getting even higher than nurse blood pressure check. I'm getting right around 150/104.                   Will have her continue the benazepril 20 mg daily.        Will add Norvasc 5 mg daily.  Today will check bmet since increasing the benazepril dose at last visit.        Have her return for follow-up in 2 weeks to recheck blood pressure after addition of Norvasc.       - amLODipine (NORVASC) 5 MG tablet; Take 1 tablet (5 mg total) by mouth daily.  Dispense: 30 tablet; Refill: 0        - BASIC METABOLIC PANEL WITH GFR  2. Thyroid disease 07/20/2017: TSH was normal at recent lab. Continue current dose.           - levothyroxine (SYNTHROID, LEVOTHROID) 112 MCG tablet; TAKE 1 TABLET BY MOUTH ONCE DAILY  Dispense: 90 tablet; Refill: 1  3.Depression, PTSD 06/2017: Today I gave her phone number for [facility]  [phone]. They accept Medicare. When patient returns for follow-up visit in 2 weeks I will follow up to see if she has scheduled this appointment and encouraged her to follow up on this. 07/20/2017: She reports that she did call there and left a message. Today told her that if she does not hear anything from them within 1 week to call them back again.    08/03/2017: Today she reports that she did add the Norvasc 5 mg daily and has been taking this every day as directed. Is having no lower extremity edema or other adverse effects. Continues to take the benazepril daily as well. Her boyfriend is with her during visit today-- the one who recently came from [place]. Patient is in good spirits and seems very happy. Asked if she had gotten the appointment with psychiatry she says that she actually lost that number so I have written down that phone number on her AVS for her again today and she is to call to schedule that appointment. She has no other concerns to address today.   Past Medical History:  Diagnosis Date  . Anxiety   . Asthma   . Depression   . Hypertension   . Hypothyroidism   . Obesity   . Thyroid disease      Home Meds: Outpatient Medications Prior to Visit  Medication Sig Dispense Refill  . amitriptyline (ELAVIL) 50 MG tablet Take 1 tablet (50 mg total) by mouth at bedtime. 30 tablet 5  . amLODipine (NORVASC) 5 MG tablet Take 1 tablet (5 mg total) by mouth daily. 30 tablet 0  . benazepril (LOTENSIN) 20 MG tablet Take 1 tablet (20 mg total) by mouth daily. 30 tablet 0  . ibuprofen (ADVIL,MOTRIN) 800 MG tablet Take 1 tablet (800 mg total) by mouth every 8 (eight) hours as needed. 90 tablet 2  . levothyroxine (SYNTHROID, LEVOTHROID) 112 MCG tablet TAKE 1 TABLET BY MOUTH ONCE DAILY 90 tablet 1  . medroxyPROGESTERone (PROVERA) 10 MG tablet Take 2 tablets (20 mg total) by mouth daily. 60 tablet 6   Facility-Administered Medications Prior to Visit  Medication Dose Route Frequency  Provider Last Rate Last Dose  . leuprolide (LUPRON) injection 11.25 mg  11.25 mg Intramuscular Once Woodroe Mode, MD        Allergies:  Allergies  Allergen Reactions  . Ivp Dye [Iodinated Diagnostic Agents] Anaphylaxis    "Almost died"  . Penicillins Hives    Has patient had a PCN reaction causing immediate rash, facial/tongue/throat swelling, SOB or lightheadedness with hypotension: No Has patient had a PCN reaction causing severe rash involving mucus membranes or skin necrosis: Yes Has patient had a PCN reaction that required hospitalization No Has patient had a  PCN reaction occurring within the last 10 years: No If all of the above answers are "NO", then may proceed with Cephalosporin use.     Social History   Social History  . Marital status: Single    Spouse name: N/A  . Number of children: N/A  . Years of education: N/A   Occupational History  . Not on file.   Social History Main Topics  . Smoking status: Never Smoker  . Smokeless tobacco: Never Used  . Alcohol use No  . Drug use: No  . Sexual activity: Yes    Birth control/ protection: None   Other Topics Concern  . Not on file   Social History Narrative  . No narrative on file    Family History  Problem Relation Age of Onset  . Cancer Mother 20       Breast  . Asthma Sister   . Asthma Brother   . Asthma Sister   . Birth defects Sister      Review of Systems:  See HPI for pertinent ROS. All other ROS negative.    Physical Exam: Blood pressure 124/90, pulse 79, temperature 98.4 F (36.9 C), temperature source Oral, resp. rate 16, height 5\' 2"  (1.575 m), weight 109.2 kg (240 lb 12.8 oz), SpO2 98 %., Body mass index is 44.04 kg/m. General: Obese AAF. Appears in no acute distress. Neck: Supple. No thyromegaly. No lymphadenopathy. No carotid bruits. Lungs: Clear bilaterally to auscultation without wheezes, rales, or rhonchi. Breathing is unlabored. Heart: RRR with S1 S2. No murmurs, rubs, or  gallops. Musculoskeletal:  Strength and tone normal for age. Extremities/Skin: Warm and dry. No lower extremity edema. Neuro: Alert and oriented X 3. Moves all extremities spontaneously. Gait is normal. CNII-XII grossly in tact. Psych:  Responds to questions appropriately with a normal affect.     ASSESSMENT AND PLAN:  41 y.o. year old female with   1. Essential hypertension 07/20/2017: I rechecked blood pressure myself and I'm getting even higher than nurse blood pressure check. I'm getting right around 150/104.                   Will have her continue the benazepril 20 mg daily.        Will add Norvasc 5 mg daily.        Today will check bmet since increasing the benazepril dose at last visit.        Have her return for follow-up in 2 weeks to recheck blood pressure after addition of Norvasc.       - amLODipine (NORVASC) 5 MG tablet; Take 1 tablet (5 mg total) by mouth daily.  Dispense: 30 tablet; Refill: 0        - BASIC METABOLIC PANEL WITH GFR 04/14/3709: Blood pressure is now controlled. She will continue current medications without any additional change. Astepro 20 mg daily. Continue Norvasc 5 mg daily.  2. Thyroid disease 07/20/2017: TSH was normal at recent lab. Continue current dose.           - levothyroxine (SYNTHROID, LEVOTHROID) 112 MCG tablet; TAKE 1 TABLET BY MOUTH ONCE DAILY  Dispense: 90 tablet; Refill: 1  3.Depression, PTSD 06/2017: Today I gave her phone number for [facility] [phone]. They accept Medicare. When patient returns for follow-up visit in 2 weeks I will follow up to see if she has scheduled this appointment and encouraged her to follow up on this. 07/20/2017: She reports that she did call there and left a  message. Today told her that if she does not hear anything from them within 1 week to call them back again. 08/03/17: She reports that she actually lost that phone number and has not followed up with getting that appointment. Day I have written down  the phone number and contact information for her again. ----------------Today I gave her phone number for [facility] [phone]. They accept Medicare.Signed,    [address] [place], [place], [facility] 08/03/2017 10:20 AM

## 2017-08-12 ENCOUNTER — Ambulatory Visit: Payer: Medicare Other

## 2017-08-12 VITALS — BP 118/82 | HR 75 | Wt 238.2 lb

## 2017-08-12 DIAGNOSIS — Z79818 Long term (current) use of other agents affecting estrogen receptors and estrogen levels: Secondary | ICD-10-CM

## 2017-08-12 MED ORDER — LEUPROLIDE ACETATE 3.75 MG IM KIT: 11.2500 mg | PACK | Freq: Once | INTRAMUSCULAR | Status: DC

## 2017-08-12 NOTE — Progress Notes (Signed)
Agree with nursing staff's documentation of this patient's clinic encounter.  Kerry Hough, PA-C 08/12/2017 11:41 AM

## 2017-08-12 NOTE — Progress Notes (Signed)
Pt here today for last Depo Lupron injection for endometriosis per Dr. Roselie Awkward recommendations.  Pt tolerated well. I advised pt to make a f/u appt to discuss with the provider the next step in her management due to this injection being last.  Pt stated understanding with no further questions.

## 2017-08-18 ENCOUNTER — Encounter: Payer: Self-pay | Admitting: Physician Assistant

## 2017-08-25 ENCOUNTER — Inpatient Hospital Stay (HOSPITAL_COMMUNITY): Payer: Medicare Other

## 2017-08-25 ENCOUNTER — Inpatient Hospital Stay (HOSPITAL_COMMUNITY)
Admission: AD | Admit: 2017-08-25 | Discharge: 2017-08-25 | Disposition: A | Discharge status: Home / Self Care | Payer: Medicare Other | Source: Ambulatory Visit | Attending: Emergency Medicine | Admitting: Emergency Medicine

## 2017-08-25 ENCOUNTER — Encounter (HOSPITAL_COMMUNITY): Payer: Self-pay

## 2017-08-25 DIAGNOSIS — J45909 Unspecified asthma, uncomplicated: Secondary | ICD-10-CM | POA: Insufficient documentation

## 2017-08-25 DIAGNOSIS — I10 Essential (primary) hypertension: Secondary | ICD-10-CM | POA: Diagnosis not present

## 2017-08-25 DIAGNOSIS — R0602 Shortness of breath: Secondary | ICD-10-CM | POA: Insufficient documentation

## 2017-08-25 DIAGNOSIS — R102 Pelvic and perineal pain: Secondary | ICD-10-CM

## 2017-08-25 DIAGNOSIS — E039 Hypothyroidism, unspecified: Secondary | ICD-10-CM | POA: Insufficient documentation

## 2017-08-25 DIAGNOSIS — R079 Chest pain, unspecified: Secondary | ICD-10-CM | POA: Diagnosis not present

## 2017-08-25 DIAGNOSIS — Z79899 Other long term (current) drug therapy: Secondary | ICD-10-CM | POA: Insufficient documentation

## 2017-08-25 LAB — TROPONIN I: Troponin I: <0.03 ng/mL (ref <0.03)

## 2017-08-25 LAB — COMPREHENSIVE METABOLIC PANEL
ALBUMIN: 4 g/dL (ref 3.5–5.0)
ALK PHOS: 71 U/L (ref 38–126)
ALT: 17 U/L (ref 14–54)
AST: 14 U/L — AB (ref 15–41)
Anion gap: 9 (ref 5–15)
BILIRUBIN TOTAL: 0.6 mg/dL (ref 0.3–1.2)
BUN: 14 mg/dL (ref 6–20)
CALCIUM: 8.9 mg/dL (ref 8.9–10.3)
CO2: 25 mmol/L (ref 22–32)
Chloride: 106 mmol/L (ref 101–111)
Creatinine, Ser: 1.27 mg/dL — ABNORMAL HIGH (ref 0.44–1.00)
GFR calc Af Amer: 59 mL/min — ABNORMAL LOW (ref >60)
GFR calc non Af Amer: 51 mL/min — ABNORMAL LOW (ref >60)
GLUCOSE: 102 mg/dL — AB (ref 65–99)
POTASSIUM: 3.6 mmol/L (ref 3.5–5.1)
Sodium: 140 mmol/L (ref 135–145)
TOTAL PROTEIN: 8.2 g/dL — AB (ref 6.5–8.1)

## 2017-08-25 LAB — URINALYSIS, ROUTINE W REFLEX MICROSCOPIC
Bilirubin Urine: NEGATIVE
Glucose, UA: NEGATIVE mg/dL
Hgb urine dipstick: NEGATIVE
Ketones, ur: NEGATIVE mg/dL
LEUKOCYTES UA: NEGATIVE
Nitrite: NEGATIVE
PROTEIN: NEGATIVE mg/dL
SPECIFIC GRAVITY, URINE: 1.026 (ref 1.005–1.030)
pH: 6 (ref 5.0–8.0)

## 2017-08-25 LAB — CBC
HEMATOCRIT: 37.4 % (ref 36.0–46.0)
HEMOGLOBIN: 12.6 g/dL (ref 12.0–15.0)
MCH: 29.2 pg (ref 26.0–34.0)
MCHC: 33.7 g/dL (ref 30.0–36.0)
MCV: 86.6 fL (ref 78.0–100.0)
Platelets: 270 10*3/uL (ref 150–400)
RBC: 4.32 MIL/uL (ref 3.87–5.11)
RDW: 14.3 % (ref 11.5–15.5)
WBC: 8.5 10*3/uL (ref 4.0–10.5)

## 2017-08-25 LAB — D-DIMER, QUANTITATIVE: D-Dimer, Quant: 0.39 ug/mL-FEU (ref 0.00–0.50)

## 2017-08-25 LAB — POCT PREGNANCY, URINE: Preg Test, Ur: NEGATIVE

## 2017-08-25 MED ORDER — ASPIRIN 81 MG PO CHEW
324.0000 mg | CHEWABLE_TABLET | Freq: Once | ORAL | Status: AC
  Administered 2017-08-25: 324 mg via ORAL

## 2017-08-25 MED ORDER — NIFEDIPINE 10 MG PO CAPS
10.0000 mg | ORAL_CAPSULE | Freq: Once | ORAL | Status: AC
  Administered 2017-08-25: 10 mg via ORAL
  Filled 2017-08-25: qty 1

## 2017-08-25 MED ORDER — NIFEDIPINE 10 MG PO CAPS
10.0000 mg | ORAL_CAPSULE | Freq: Once | ORAL | Status: DC
  Filled 2017-08-25: qty 1

## 2017-08-25 MED ORDER — LIDOCAINE 5 % EX PTCH
1.0000 | MEDICATED_PATCH | CUTANEOUS | Status: DC
  Administered 2017-08-25: 1 via TRANSDERMAL
  Filled 2017-08-25: qty 1

## 2017-08-25 MED ORDER — SODIUM CHLORIDE 0.9 % IV BOLUS (SEPSIS)
1000.0000 mL | Freq: Once | INTRAVENOUS | Status: AC
  Administered 2017-08-25: 1000 mL via INTRAVENOUS

## 2017-08-25 MED ORDER — ASPIRIN 81 MG PO CHEW
CHEWABLE_TABLET | ORAL | Status: AC
  Administered 2017-08-25: 324 mg via ORAL
  Filled 2017-08-25: qty 4

## 2017-08-25 MED ORDER — NITROGLYCERIN 0.4 MG SL SUBL
0.4000 mg | SUBLINGUAL_TABLET | SUBLINGUAL | Status: DC | PRN
  Administered 2017-08-25: 0.4 mg via SUBLINGUAL

## 2017-08-25 MED ORDER — LIDOCAINE 5 % EX PTCH: 1.0000 | MEDICATED_PATCH | CUTANEOUS | 0 refills | Status: DC

## 2017-08-25 MED ORDER — NITROGLYCERIN 0.4 MG SL SUBL
SUBLINGUAL_TABLET | SUBLINGUAL | Status: AC
  Administered 2017-08-25: 0.4 mg via SUBLINGUAL
  Filled 2017-08-25: qty 1

## 2017-08-25 MED ORDER — IBUPROFEN 800 MG PO TABS: 800.0000 mg | ORAL_TABLET | Freq: Three times a day (TID) | ORAL | 0 refills | Status: DC | PRN

## 2017-08-25 MED ORDER — KETOROLAC TROMETHAMINE 30 MG/ML IJ SOLN
30.0000 mg | Freq: Once | INTRAMUSCULAR | Status: AC
  Administered 2017-08-25: 30 mg via INTRAVENOUS
  Filled 2017-08-25: qty 1

## 2017-08-25 NOTE — MAU Note (Signed)
Dr Ihor Dow at bedside

## 2017-08-25 NOTE — MAU Note (Signed)
Pt was calling out and saying her chest was hurting "like a bone is breaking". She was grabbing the center of her chest and curled over in pain.

## 2017-08-25 NOTE — Discharge Instructions (Signed)
Your workup today was reassuring and did not reveal any concerning cause of your chest pain. You may ice areas of pain to limit discomfort and inflammation. Take ibuprofen as needed for pain control. Continue with your daily medications as prescribed.

## 2017-08-25 NOTE — MAU Provider Note (Signed)
History     CSN: [id number]  Arrival date and time: 08/25/17 1728   Chief Complaint  Patient presents with  . Chest Pain   41 y.o. Non-pregnant female here with chest pain and SOB. Describes chest pain as pressure. Sx started 1 hr ago after she got out of the shower. Associated sx are dizziness. No syncope. Denies jaw pain or pain radiating into arm. No cardiac hx. Hx of HTN, took meds today.    Past Medical History:  Diagnosis Date  . Anxiety   . Asthma   . Depression   . Hypertension   . Hypothyroidism   . Obesity   . Thyroid disease     Past Surgical History:  Procedure Laterality Date  . APPENDECTOMY    . CESAREAN SECTION    . [place] OF UTERUS  08/08/2008 & 03/08/2013  . LAPAROSCOPY N/A 01/24/2017   Procedure: LAPAROSCOPY DIAGNOSTIC;  Surgeon: Woodroe Mode, MD;  Location: [facility];  Service: Gynecology;  Laterality: N/A;  . SHOULDER SURGERY Left   . THYROIDECTOMY      Family History  Problem Relation Age of Onset  . Cancer Mother 81       Breast  . Depression Mother   . Hearing loss Mother   . Hypertension Mother   . Stroke Mother   . Asthma Brother   . Birth defects Sister     Social History  Substance Use Topics  . Smoking status: Never Smoker  . Smokeless tobacco: Never Used  . Alcohol use No    Allergies:  Allergies  Allergen Reactions  . Ivp Dye [Iodinated Diagnostic Agents] Anaphylaxis    "Almost died"  . Penicillins Hives    Has patient had a PCN reaction causing immediate rash, facial/tongue/throat swelling, SOB or lightheadedness with hypotension: No Has patient had a PCN reaction causing severe rash involving mucus membranes or skin necrosis: Yes Has patient had a PCN reaction that required hospitalization No Has patient had a PCN reaction occurring within the last 10 years: No If all of the above answers are "NO", then may proceed with Cephalosporin use.     Prescriptions Prior to Admission  Medication Sig Dispense  Refill Last Dose  . amitriptyline (ELAVIL) 50 MG tablet Take 1 tablet (50 mg total) by mouth at bedtime. 30 tablet 5 Taking  . amLODipine (NORVASC) 5 MG tablet Take 1 tablet (5 mg total) by mouth daily. 90 tablet 2   . benazepril (LOTENSIN) 20 MG tablet Take 1 tablet (20 mg total) by mouth daily. 30 tablet 0 Taking  . ibuprofen (ADVIL,MOTRIN) 800 MG tablet Take 1 tablet (800 mg total) by mouth every 8 (eight) hours as needed. 90 tablet 2 Taking  . levothyroxine (SYNTHROID, LEVOTHROID) 112 MCG tablet TAKE 1 TABLET BY MOUTH ONCE DAILY 90 tablet 1 Taking  . medroxyPROGESTERone (PROVERA) 10 MG tablet Take 2 tablets (20 mg total) by mouth daily. 60 tablet 6 Taking    Review of Systems  Respiratory: Positive for shortness of breath.   Cardiovascular: Positive for chest pain.  Gastrointestinal: Negative for abdominal pain.  Neurological: Positive for dizziness.   Physical Exam   Blood pressure (!) 152/108, pulse (!) 110, temperature 98.7 F (37.1 C), temperature source Oral, resp. rate 18, height 5\' 2"  (1.575 m), weight 239 lb (108.4 kg), last menstrual period 03/02/2017, SpO2 98 %.  Patient Vitals for the past 24 hrs:  BP Temp Temp src Pulse Resp SpO2 Height Weight  08/25/17 1830 Marland Kitchen)  152/108 - - (!) 110 - - - -  08/25/17 1829 - - - - - 98 % - -  08/25/17 1825 - - - (!) 124 - 100 % - -  08/25/17 1824 (!) 190/126 - - (!) 108 - 99 % - -  08/25/17 1738 (!) 130/94 98.7 F (37.1 C) Oral 85 18 100 % 5\' 2"  (1.575 m) 239 lb (108.4 kg)    Physical Exam  Nursing note and vitals reviewed. Constitutional: She is oriented to person, place, and time. She appears well-developed and well-nourished. No distress.  HENT:  Head: Normocephalic and atraumatic.  Neck: Normal range of motion.  Cardiovascular: Normal rate, regular rhythm and normal heart sounds.   Respiratory: Effort normal and breath sounds normal. No respiratory distress. She has no wheezes. She has no rales. She exhibits tenderness  (exquisite).  Musculoskeletal: Normal range of motion.  Neurological: She is alert and oriented to person, place, and time.  Skin: Skin is warm and dry.  Psychiatric: She has a normal mood and affect.   Results for orders placed or performed during the hospital encounter of 08/25/17 (from the past 24 hour(s))  Urinalysis, Routine w reflex microscopic     Status: None   Collection Time: 08/25/17  5:40 PM  Result Value Ref Range   Color, Urine YELLOW YELLOW   APPearance CLEAR CLEAR   Specific Gravity, Urine 1.026 1.005 - 1.030   pH 6.0 5.0 - 8.0   Glucose, UA NEGATIVE NEGATIVE mg/dL   Hgb urine dipstick NEGATIVE NEGATIVE   Bilirubin Urine NEGATIVE NEGATIVE   Ketones, ur NEGATIVE NEGATIVE mg/dL   Protein, ur NEGATIVE NEGATIVE mg/dL   Nitrite NEGATIVE NEGATIVE   Leukocytes, UA NEGATIVE NEGATIVE  Pregnancy, urine POC     Status: None   Collection Time: 08/25/17  5:46 PM  Result Value Ref Range   Preg Test, Ur NEGATIVE NEGATIVE  CBC     Status: None   Collection Time: 08/25/17  5:48 PM  Result Value Ref Range   WBC 8.5 4.0 - 10.5 K/uL   RBC 4.32 3.87 - 5.11 MIL/uL   Hemoglobin 12.6 12.0 - 15.0 g/dL   HCT 37.4 36.0 - 46.0 %   MCV 86.6 78.0 - 100.0 fL   MCH 29.2 26.0 - 34.0 pg   MCHC 33.7 30.0 - 36.0 g/dL   RDW 14.3 11.5 - 15.5 %   Platelets 270 150 - 400 K/uL  Comprehensive metabolic panel     Status: Abnormal   Collection Time: 08/25/17  5:48 PM  Result Value Ref Range   Sodium 140 135 - 145 mmol/L   Potassium 3.6 3.5 - 5.1 mmol/L   Chloride 106 101 - 111 mmol/L   CO2 25 22 - 32 mmol/L   Glucose, Bld 102 (H) 65 - 99 mg/dL   BUN 14 6 - 20 mg/dL   Creatinine, Ser 1.27 (H) 0.44 - 1.00 mg/dL   Calcium 8.9 8.9 - 10.3 mg/dL   Total Protein 8.2 (H) 6.5 - 8.1 g/dL   Albumin 4.0 3.5 - 5.0 g/dL   AST 14 (L) 15 - 41 U/L   ALT 17 14 - 54 U/L   Alkaline Phosphatase 71 38 - 126 U/L   Total Bilirubin 0.6 0.3 - 1.2 mg/dL   GFR calc non Af Amer 51 (L) >60 mL/min   GFR calc Af Amer 59  (L) >60 mL/min   Anion gap 9 5 - 15   MAU Course  Procedures EKG ASA  Nitroglycerin Procardia  MDM EKG and labs ordered. Awaiting cardiology for EKG read. Dr. Tomi Bamberger consulted at [facility] ED, will accept transfer for further evaluation. EMS transport notified. Pt became very uncomfortable and started screaming from pain. Reassessment of pt: hunched over, clutching chest, c/o worsening pain, pt admits to hx of panic attacks in the past with similar pain but not as severe. Dr. Ihor Dow at bedside, requests Procardia for HTN. Dr. Tomi Bamberger updated with findings.   Assessment and Plan  Chest pain Transfer to [facility] ED  Julianne Handler, CNM 08/25/2017, 6:35 PM

## 2017-08-25 NOTE — MAU Note (Signed)
Pt reports she started having chest pain about an hour ago and then started feeling SOB and dizzy.

## 2017-08-25 NOTE — ED Provider Notes (Signed)
[facility] EMERGENCY DEPARTMENT Provider Note   CSN: [id number] Arrival date & time: 08/25/17  1728     History   Chief Complaint Chief Complaint  Patient presents with  . Chest Pain    HPI Janice Roberts is a 41 y.o. female.  41 year old female with a history of asthma, hypertension, hypothyroid, and anxiety presents to the emergency department for evaluation of central chest pain. She reports that pain began 5 hours ago. It has been constant, nonradiating, and without alleviating factors. She notes some worsening of her pain with movements and palpation to her central chest. She was given sublingual nitroglycerin and tablets of 81 mg aspirin at [facility] prior to transfer without relief of her pain. She states that she has felt short of breath at times, but this is not constant. She denies shortness of breath currently. No worsening chest pain with deep breathing. She further denies associated nausea, lightheadedness, extremity numbness or paresthesias, extremity weakness, hemoptysis, leg swelling. No recent fevers. She denies any recent surgeries or hospitalizations as well as the use of birth control. No PHx or FHx of ACS, DVT, PE. Denies any recent medication changes. No heavy lifting.   The history is provided by the patient. No language interpreter was used.  Chest Pain      Past Medical History:  Diagnosis Date  . Anxiety   . Asthma   . Depression   . Hypertension   . Hypothyroidism   . Obesity   . Thyroid disease     Patient Active Problem List   Diagnosis Date Noted  . Endometriosis determined by laparoscopy 02/11/2017  . PID (acute pelvic inflammatory disease) 11/17/2016  . Pelvic pain 11/15/2016  . Family history of breast cancer in mother 10/21/2016  . Pelvic pain in female 11/10/2015  . Anxiety   . Depression   . Asthma   . Obesity   . Hypertension   . Thyroid disease     Past Surgical History:  Procedure Laterality Date    . APPENDECTOMY    . CESAREAN SECTION    . [place] OF UTERUS  08/08/2008 & 03/08/2013  . LAPAROSCOPY N/A 01/24/2017   Procedure: LAPAROSCOPY DIAGNOSTIC;  Surgeon: Woodroe Mode, MD;  Location: [facility];  Service: Gynecology;  Laterality: N/A;  . SHOULDER SURGERY Left   . THYROIDECTOMY      OB History    Gravida Para Term Preterm AB Living   10 2 2  0 8 2   SAB TAB Ectopic Multiple Live Births   1 7     2        Home Medications    Prior to Admission medications   Medication Sig Start Date End Date Taking? Authorizing Provider  amitriptyline (ELAVIL) 50 MG tablet Take 1 tablet (50 mg total) by mouth at bedtime. 04/13/17  Yes Woodroe Mode, MD  amLODipine (NORVASC) 5 MG tablet Take 1 tablet (5 mg total) by mouth daily. 08/03/17  Yes Dena Billet B, PA-C  benazepril (LOTENSIN) 20 MG tablet Take 1 tablet (20 mg total) by mouth daily. 07/06/17  Yes [name], PA-C  ibuprofen (ADVIL,MOTRIN) 800 MG tablet Take 1 tablet (800 mg total) by mouth every 8 (eight) hours as needed. 04/13/17  Yes Woodroe Mode, MD  levothyroxine (SYNTHROID, LEVOTHROID) 112 MCG tablet TAKE 1 TABLET BY MOUTH ONCE DAILY Patient taking differently: Take 112 mcg by mouth daily before breakfast. TAKE 1 TABLET BY MOUTH ONCE DAILY 07/20/17  Yes [name], PA-C  medroxyPROGESTERone (PROVERA) 10 MG tablet Take 2 tablets (20 mg total) by mouth daily. 04/13/17  Yes Woodroe Mode, MD    Family History Family History  Problem Relation Age of Onset  . Cancer Mother 12       Breast  . Depression Mother   . Hearing loss Mother   . Hypertension Mother   . Stroke Mother   . Asthma Brother   . Birth defects Sister     Social History Social History  Substance Use Topics  . Smoking status: Never Smoker  . Smokeless tobacco: Never Used  . Alcohol use No     Allergies   Ivp dye [iodinated diagnostic agents] and Penicillins   Review of Systems Review of Systems  Cardiovascular: Positive for chest  pain.  Ten systems reviewed and are negative for acute change, except as noted in the HPI.    Physical Exam Updated Vital Signs BP (!) 131/96   Pulse 88   Temp 98.7 F (37.1 C) (Oral)   Resp (!) 22   Ht 5\' 2"  (1.575 m)   Wt 108.4 kg (239 lb)   LMP 03/02/2017 Comment: on oral contraceptive to stop period  SpO2 97%   BMI 43.71 kg/m   Physical Exam  Constitutional: She is oriented to person, place, and time. She appears well-developed and well-nourished. No distress.  Nontoxic and in NAD  HENT:  Head: Normocephalic and atraumatic.  Eyes: Conjunctivae and EOM are normal. No scleral icterus.  Neck: Normal range of motion.  Cardiovascular: Normal rate, regular rhythm and intact distal pulses.   Pulmonary/Chest: Effort normal. No respiratory distress. She has no wheezes. She has no rales. She exhibits tenderness. She exhibits no crepitus, no deformity and no swelling.    Lungs CTAB. Respirations even and unlabored  Musculoskeletal: Normal range of motion.  No BLE pitting edema  Neurological: She is alert and oriented to person, place, and time. She exhibits normal muscle tone. Coordination normal.  Skin: Skin is warm and dry. No rash noted. She is not diaphoretic. No erythema. No pallor.  Psychiatric: She has a normal mood and affect. Her behavior is normal.  Nursing note and vitals reviewed.    ED Treatments / Results  Labs (all labs ordered are listed, but only abnormal results are displayed) Labs Reviewed  COMPREHENSIVE METABOLIC PANEL - Abnormal; Notable for the following:       Result Value   Glucose, Bld 102 (*)    Creatinine, Ser 1.27 (*)    Total Protein 8.2 (*)    AST 14 (*)    GFR calc non Af Amer 51 (*)    GFR calc Af Amer 59 (*)    All other components within normal limits  URINALYSIS, ROUTINE W REFLEX MICROSCOPIC  CBC  TROPONIN I  D-DIMER, QUANTITATIVE (NOT AT [facility])  TROPONIN I  POCT PREGNANCY, URINE    EKG  EKG Interpretation None        Radiology Dg Chest 2 View  Result Date: 08/25/2017 CLINICAL DATA:  Chest pain and shortness of breath for 1 day. EXAM: CHEST  2 VIEW COMPARISON:  08/13/2012 FINDINGS: The heart size and mediastinal contours are within normal limits. Both lungs are clear. The visualized skeletal structures are unremarkable. IMPRESSION: No active cardiopulmonary disease. Electronically Signed   By: Earle Gell M.D.   On: 08/25/2017 20:23    Procedures Procedures (including critical care time)  Medications Ordered in ED Medications  nitroGLYCERIN (  NITROSTAT) SL tablet 0.4 mg (0.4 mg Sublingual Given 08/25/17 1823)  NIFEdipine (PROCARDIA) capsule 10 mg (not administered)  aspirin chewable tablet 324 mg (324 mg Oral Given 08/25/17 1830)  NIFEdipine (PROCARDIA) capsule 10 mg (10 mg Oral Given 08/25/17 1834)     Initial Impression / Assessment and Plan / ED Course  I have reviewed the triage vital signs and the nursing notes.  Pertinent labs & imaging results that were available during my care of the patient were reviewed by me and considered in my medical decision making (see chart for details).     41 year old female presents to the emergency department for evaluation of chest pain. Pain began at approximately 3:30 PM. Patient notes it to be constant since onset. It is located in her central chest and is nonradiating. Pain aggravated with palpation to the chest wall suggesting musculoskeletal etiology.   Patient with a reassuring cardiac workup today including negative troponin 2. Heart score 1 consistent with low risk of acute coronary event. Chest x-ray obtained which shows no pneumonia, pneumothorax, pleural effusion. No mediastinal widening to suggest dissection. Patient was tachycardiac during evaluation at [facility] today. Low pretest probability for PE, though d-dimer was drawn and found to be negative. Doubt pulmonary embolus as cause of chest pain.  Based on reassuring evaluation, I do  not believe further emergent workup is indicated. The patient is followed by a primary care doctor with whom she can follow-up on an outpatient basis. Supportive care advised with return if symptoms worsen. Patient discharged in stable condition with no unaddressed concerns.   Final Clinical Impressions(s) / ED Diagnoses   Final diagnoses:  Chest pain, unspecified type    New Prescriptions New Prescriptions   No medications on file     Antonietta Breach, PA-C 08/25/17 2315    Forde Dandy, MD 08/27/17 [phone]

## 2017-08-25 NOTE — ED Notes (Addendum)
Pt sts pain worse on inspiration & palpation.

## 2017-09-12 ENCOUNTER — Encounter: Payer: Self-pay | Admitting: Obstetrics & Gynecology

## 2017-09-12 ENCOUNTER — Ambulatory Visit (INDEPENDENT_AMBULATORY_CARE_PROVIDER_SITE_OTHER): Payer: Medicare Other | Admitting: Obstetrics & Gynecology

## 2017-09-12 VITALS — BP 140/99 | HR 82 | Wt 238.9 lb

## 2017-09-12 DIAGNOSIS — R102 Pelvic and perineal pain: Secondary | ICD-10-CM

## 2017-09-12 DIAGNOSIS — N809 Endometriosis, unspecified: Secondary | ICD-10-CM | POA: Diagnosis not present

## 2017-09-12 DIAGNOSIS — F329 Major depressive disorder, single episode, unspecified: Secondary | ICD-10-CM

## 2017-09-12 DIAGNOSIS — F32A Depression, unspecified: Secondary | ICD-10-CM

## 2017-09-12 NOTE — Patient Instructions (Signed)
Hysterectomy Information A hysterectomy is a surgery to remove your uterus. After surgery, you will no longer have periods. Also, you will not be able to get pregnant. Reasons for this surgery  You have bleeding that is not normal and keeps coming back.  You have lasting (chronic) lower belly (pelvic) pain.  You have a lasting infection.  The lining of your uterus grows outside your uterus.  The lining of your uterus grows in the muscle of your uterus.  Your uterus falls down into your vagina.  You have a growth in your uterus that causes problems.  You have cells that could turn into cancer (precancerous cells).  You have cancer of the uterus or cervix. Types There are 3 types of hysterectomies. Depending on the type, the surgery will:  Remove the top part of the uterus only.  Remove the uterus and the cervix.  Remove the uterus, cervix, and tissue that holds the uterus in place in the lower belly.  Ways a hysterectomy can be performed There are 5 ways this surgery can be performed.  A cut (incision) is made in the belly (abdomen). The uterus is taken out through the cut.  A cut is made in the vagina. The uterus is taken out through the cut.  Three or four cuts are made in the belly. A surgical device with a camera is put through one of the cuts. The uterus is cut into small pieces. The uterus is taken out through the cuts or the vagina.  Three or four cuts are made in the belly. A surgical device with a camera is put through one of the cuts. The uterus is taken out through the vagina.  Three or four cuts are made in the belly. A surgical device that is controlled by a computer makes a visual image. The device helps the surgeon control the surgical tools. The uterus is cut into small pieces. The pieces are taken out through the cuts or through the vagina.  What can I expect after the surgery?  You will be given pain medicine.  You will need help at home for 3-5 days  after surgery.  You will need to see your doctor in 2-4 weeks after surgery.  You may get hot flashes, have night sweats, and have trouble sleeping.  You may need to have Pap tests in the future if your surgery was related to cancer. Talk to your doctor. It is still good to have regular exams. This information is not intended to replace advice given to you by your health care provider. Make sure you discuss any questions you have with your health care provider. Document Released: 01/17/2012 Document Revised: 04/01/2016 Document Reviewed: 07/02/2013 Elsevier Interactive Patient Education  2018 Elsevier Inc.  

## 2017-09-12 NOTE — Progress Notes (Signed)
Patient ID: Janice Roberts, female   DOB: [birth year], 41 y.o.   MRN: [id number]  Chief Complaint  Patient presents with  . Pelvic Pain    HPI Janice Roberts is a 41 y.o. female.  She has received 3 Lupron depot injections and her pain has worsened in the last month HPI  Past Medical History:  Diagnosis Date  . Anxiety   . Asthma   . Depression   . Hypertension   . Hypothyroidism   . Obesity   . Thyroid disease     Past Surgical History:  Procedure Laterality Date  . APPENDECTOMY    . CESAREAN SECTION    . [place] OF UTERUS  08/08/2008 & 03/08/2013  . SHOULDER SURGERY Left   . THYROIDECTOMY      Family History  Problem Relation Age of Onset  . Cancer Mother 50       Breast  . Depression Mother   . Hearing loss Mother   . Hypertension Mother   . Stroke Mother   . Asthma Brother   . Birth defects Sister     Social History Social History   Tobacco Use  . Smoking status: Never Smoker  . Smokeless tobacco: Never Used  Substance Use Topics  . Alcohol use: No  . Drug use: No    Allergies  Allergen Reactions  . Ivp Dye [Iodinated Diagnostic Agents] Anaphylaxis    "Almost died"  . Penicillins Hives    Has patient had a PCN reaction causing immediate rash, facial/tongue/throat swelling, SOB or lightheadedness with hypotension: No Has patient had a PCN reaction causing severe rash involving mucus membranes or skin necrosis: Yes Has patient had a PCN reaction that required hospitalization No Has patient had a PCN reaction occurring within the last 10 years: No If all of the above answers are "NO", then may proceed with Cephalosporin use.     Current Outpatient Medications  Medication Sig Dispense Refill  . amitriptyline (ELAVIL) 50 MG tablet Take 1 tablet (50 mg total) by mouth at bedtime. 30 tablet 5  . amLODipine (NORVASC) 5 MG tablet Take 1 tablet (5 mg total) by mouth daily. 90 tablet 2  . benazepril (LOTENSIN) 20 MG tablet Take 1 tablet (20  mg total) by mouth daily. (Patient taking differently: Take 20 mg daily by mouth. ) 30 tablet 0  . ibuprofen (ADVIL,MOTRIN) 800 MG tablet Take 1 tablet (800 mg total) by mouth every 8 (eight) hours as needed. 30 tablet 0  . levothyroxine (SYNTHROID, LEVOTHROID) 112 MCG tablet TAKE 1 TABLET BY MOUTH ONCE DAILY (Patient taking differently: Take 112 mcg by mouth daily before breakfast. TAKE 1 TABLET BY MOUTH ONCE DAILY) 90 tablet 1  . medroxyPROGESTERone (PROVERA) 10 MG tablet Take 2 tablets (20 mg total) by mouth daily. 60 tablet 6  . lidocaine (LIDODERM) 5 % Place 1 patch onto the skin daily. Remove & Discard patch within 12 hours or as directed by MD (Patient not taking: Reported on 09/12/2017) 10 patch 0   No current facility-administered medications for this visit.     Review of Systems Review of Systems  Constitutional: Negative.   Respiratory: Negative.   Gastrointestinal: Positive for abdominal pain.  Genitourinary: Positive for pelvic pain. Negative for menstrual problem, vaginal bleeding and vaginal discharge.    Blood pressure (!) 140/99, pulse 82, weight 238 lb 14.4 oz (108.4 kg).  Physical Exam Physical Exam  Constitutional: She appears well-developed. She appears distressed.  Cardiovascular: Normal rate.  Psychiatric: She has a normal mood and affect. Her behavior is normal.  Vitals reviewed.   Data Reviewed Office notes Pap results Assessment    Endometriosis and pelvic pain not controlled with adequate trial of Lupron Does not desire to conceive in the future S/p one cesarean and one term vaginal birth     Plan    Candidate for definitive management with LAVH/BSO which she requests. She will return for endometrial biopsy Patient desires surgical management with  LAVH/BSO.  The risks of surgery were discussed in detail with the patient including but not limited to: bleeding which may require transfusion or reoperation; infection which may require prolonged  hospitalization or re-hospitalization and antibiotic therapy; injury to bowel, bladder, ureters and major vessels or other surrounding organs; need for additional procedures including laparotomy; thromboembolic phenomenon, incisional problems and other postoperative or anesthesia complications.  Patient was told that the likelihood that her condition and symptoms will be treated effectively with this surgical management was very high; the postoperative expectations were also discussed in detail. The patient also understands the alternative treatment options which were discussed in full. All questions were answered.  She was told that she will be contacted by our surgical scheduler regarding the time and date of her surgery; routine preoperative instructions of having nothing to eat or drink after midnight on the day prior to surgery and also coming to the hospital 1.5 hours prior to her time of surgery were also emphasized.  She was told she may be called for a preoperative appointment about a week prior to surgery and will be given further preoperative instructions at that visit. Printed patient education handouts about the procedure were given to the patient to review at home.         Janice Roberts 09/12/2017, 11:21 AM

## 2017-09-12 NOTE — Progress Notes (Signed)
Patient and/or legal guardian verbally consented to meet with Behavioral Health Clinician about presenting concerns.   

## 2017-09-16 ENCOUNTER — Encounter (HOSPITAL_COMMUNITY): Payer: Self-pay

## 2017-09-16 ENCOUNTER — Encounter: Payer: Self-pay | Admitting: General Practice

## 2017-10-13 ENCOUNTER — Other Ambulatory Visit (HOSPITAL_COMMUNITY)
Admission: RE | Admit: 2017-10-13 | Discharge: 2017-10-13 | Disposition: A | Discharge status: Home / Self Care | Payer: Medicare Other | Source: Ambulatory Visit | Attending: Obstetrics & Gynecology | Admitting: Obstetrics & Gynecology

## 2017-10-13 ENCOUNTER — Ambulatory Visit (INDEPENDENT_AMBULATORY_CARE_PROVIDER_SITE_OTHER): Payer: Medicare Other | Admitting: Obstetrics & Gynecology

## 2017-10-13 ENCOUNTER — Encounter: Payer: Self-pay | Admitting: Obstetrics & Gynecology

## 2017-10-13 VITALS — BP 137/107 | HR 92 | Ht 62.0 in | Wt 239.9 lb

## 2017-10-13 DIAGNOSIS — N939 Abnormal uterine and vaginal bleeding, unspecified: Secondary | ICD-10-CM | POA: Insufficient documentation

## 2017-10-13 LAB — POCT PREGNANCY, URINE: PREG TEST UR: NEGATIVE

## 2017-10-13 NOTE — Patient Instructions (Signed)
Hysterectomy Information A hysterectomy is a surgery to remove your uterus. After surgery, you will no longer have periods. Also, you will not be able to get pregnant. Reasons for this surgery  You have bleeding that is not normal and keeps coming back.  You have lasting (chronic) lower belly (pelvic) pain.  You have a lasting infection.  The lining of your uterus grows outside your uterus.  The lining of your uterus grows in the muscle of your uterus.  Your uterus falls down into your vagina.  You have a growth in your uterus that causes problems.  You have cells that could turn into cancer (precancerous cells).  You have cancer of the uterus or cervix. Types There are 3 types of hysterectomies. Depending on the type, the surgery will:  Remove the top part of the uterus only.  Remove the uterus and the cervix.  Remove the uterus, cervix, and tissue that holds the uterus in place in the lower belly.  Ways a hysterectomy can be performed There are 5 ways this surgery can be performed.  A cut (incision) is made in the belly (abdomen). The uterus is taken out through the cut.  A cut is made in the vagina. The uterus is taken out through the cut.  Three or four cuts are made in the belly. A surgical device with a camera is put through one of the cuts. The uterus is cut into small pieces. The uterus is taken out through the cuts or the vagina.  Three or four cuts are made in the belly. A surgical device with a camera is put through one of the cuts. The uterus is taken out through the vagina.  Three or four cuts are made in the belly. A surgical device that is controlled by a computer makes a visual image. The device helps the surgeon control the surgical tools. The uterus is cut into small pieces. The pieces are taken out through the cuts or through the vagina.  What can I expect after the surgery?  You will be given pain medicine.  You will need help at home for 3-5 days  after surgery.  You will need to see your doctor in 2-4 weeks after surgery.  You may get hot flashes, have night sweats, and have trouble sleeping.  You may need to have Pap tests in the future if your surgery was related to cancer. Talk to your doctor. It is still good to have regular exams. This information is not intended to replace advice given to you by your health care provider. Make sure you discuss any questions you have with your health care provider. Document Released: 01/17/2012 Document Revised: 04/01/2016 Document Reviewed: 07/02/2013 Elsevier Interactive Patient Education  2018 Elsevier Inc.  

## 2017-10-18 ENCOUNTER — Other Ambulatory Visit: Payer: Self-pay | Admitting: Obstetrics & Gynecology

## 2017-11-21 NOTE — Patient Instructions (Addendum)
Your procedure is scheduled on:  Tuesday, Jan 29  Enter through the Main Entrance of [facility] at: 6 am  Pick up the phone at the desk and dial [phone].  Call this number if you have problems the morning of surgery: [phone].  Remember: Do NOT eat or Do NOT drink clear liquids (including water) after midnight Monday  Take these medicines the morning of surgery with a SIP OF WATER:  Amlodipine and levothyroxine.    Bring your albuterol inhaler with you on day of surgery.  Stop herbal medications and supplements 1 week prior to surgery.  Do NOT wear jewelry (body piercing), metal hair clips/bobby pins, make-up, or nail polish. Do NOT wear lotions, powders, or perfumes.  You may wear deoderant. Do NOT shave for 48 hours prior to surgery. Do NOT bring valuables to the hospital.  Leave suitcase in car.  After surgery it may be brought to your room.  For patients admitted to the hospital, checkout time is 11:00 AM the day of discharge. Have a responsible adult drive you home and stay with you for 24 hours after your procedure.  Home with Daughter Princessa cell [phone] or Daughter Deneise Lever cell [phone].

## 2017-11-28 ENCOUNTER — Encounter (HOSPITAL_COMMUNITY)
Admission: RE | Admit: 2017-11-28 | Discharge: 2017-11-28 | Disposition: A | Discharge status: Home / Self Care | Payer: Medicare Other | Source: Ambulatory Visit | Attending: Obstetrics & Gynecology | Admitting: Obstetrics & Gynecology

## 2017-11-28 ENCOUNTER — Other Ambulatory Visit: Payer: Self-pay

## 2017-11-28 ENCOUNTER — Encounter (HOSPITAL_COMMUNITY): Payer: Self-pay | Admitting: *Deleted

## 2017-11-28 DIAGNOSIS — Z01818 Encounter for other preprocedural examination: Secondary | ICD-10-CM | POA: Diagnosis not present

## 2017-11-28 HISTORY — DX: Other seasonal allergic rhinitis: J30.2

## 2017-11-28 LAB — BASIC METABOLIC PANEL
ANION GAP: 7 (ref 5–15)
BUN: 15 mg/dL (ref 6–20)
CHLORIDE: 106 mmol/L (ref 101–111)
CO2: 25 mmol/L (ref 22–32)
CREATININE: 0.96 mg/dL (ref 0.44–1.00)
Calcium: 8.8 mg/dL — ABNORMAL LOW (ref 8.9–10.3)
GFR calc non Af Amer: >60 mL/min (ref >60)
Glucose, Bld: 104 mg/dL — ABNORMAL HIGH (ref 65–99)
Potassium: 3.5 mmol/L (ref 3.5–5.1)
SODIUM: 138 mmol/L (ref 135–145)

## 2017-11-28 LAB — CBC
HCT: 37.4 % (ref 36.0–46.0)
HEMOGLOBIN: 12.6 g/dL (ref 12.0–15.0)
MCH: 29.2 pg (ref 26.0–34.0)
MCHC: 33.7 g/dL (ref 30.0–36.0)
MCV: 86.8 fL (ref 78.0–100.0)
Platelets: 255 10*3/uL (ref 150–400)
RBC: 4.31 MIL/uL (ref 3.87–5.11)
RDW: 14.7 % (ref 11.5–15.5)
WBC: 6.9 10*3/uL (ref 4.0–10.5)

## 2017-11-28 LAB — TYPE AND SCREEN
ABO/RH(D): B POS
Antibody Screen: NEGATIVE

## 2017-12-05 ENCOUNTER — Other Ambulatory Visit: Payer: Self-pay | Admitting: Obstetrics & Gynecology

## 2017-12-06 ENCOUNTER — Observation Stay (HOSPITAL_COMMUNITY)
Admission: AD | Admit: 2017-12-06 | Discharge: 2017-12-07 | Disposition: A | Discharge status: Home / Self Care | Payer: Medicare Other | Source: Ambulatory Visit | Attending: Obstetrics & Gynecology | Admitting: Obstetrics & Gynecology

## 2017-12-06 ENCOUNTER — Other Ambulatory Visit: Payer: Self-pay

## 2017-12-06 ENCOUNTER — Encounter (HOSPITAL_COMMUNITY): Payer: Self-pay | Admitting: Anesthesiology

## 2017-12-06 ENCOUNTER — Ambulatory Visit (HOSPITAL_COMMUNITY): Payer: Medicare Other

## 2017-12-06 ENCOUNTER — Encounter (HOSPITAL_COMMUNITY)
Admission: AD | Disposition: A | Discharge status: Home / Self Care | Payer: Self-pay | Source: Ambulatory Visit | Attending: Obstetrics & Gynecology

## 2017-12-06 DIAGNOSIS — Z79890 Hormone replacement therapy (postmenopausal): Secondary | ICD-10-CM | POA: Diagnosis not present

## 2017-12-06 DIAGNOSIS — I10 Essential (primary) hypertension: Secondary | ICD-10-CM | POA: Diagnosis not present

## 2017-12-06 DIAGNOSIS — N888 Other specified noninflammatory disorders of cervix uteri: Secondary | ICD-10-CM | POA: Insufficient documentation

## 2017-12-06 DIAGNOSIS — Z823 Family history of stroke: Secondary | ICD-10-CM | POA: Diagnosis not present

## 2017-12-06 DIAGNOSIS — Z8249 Family history of ischemic heart disease and other diseases of the circulatory system: Secondary | ICD-10-CM | POA: Insufficient documentation

## 2017-12-06 DIAGNOSIS — Z803 Family history of malignant neoplasm of breast: Secondary | ICD-10-CM | POA: Diagnosis not present

## 2017-12-06 DIAGNOSIS — Z79899 Other long term (current) drug therapy: Secondary | ICD-10-CM | POA: Insufficient documentation

## 2017-12-06 DIAGNOSIS — Z6841 Body Mass Index (BMI) 40.0 and over, adult: Secondary | ICD-10-CM | POA: Diagnosis not present

## 2017-12-06 DIAGNOSIS — E039 Hypothyroidism, unspecified: Secondary | ICD-10-CM | POA: Diagnosis not present

## 2017-12-06 DIAGNOSIS — N800 Endometriosis of uterus: Secondary | ICD-10-CM | POA: Diagnosis not present

## 2017-12-06 DIAGNOSIS — Z880 Allergy status to penicillin: Secondary | ICD-10-CM | POA: Diagnosis not present

## 2017-12-06 DIAGNOSIS — N809 Endometriosis, unspecified: Secondary | ICD-10-CM | POA: Diagnosis not present

## 2017-12-06 DIAGNOSIS — R102 Pelvic and perineal pain: Secondary | ICD-10-CM

## 2017-12-06 DIAGNOSIS — F329 Major depressive disorder, single episode, unspecified: Secondary | ICD-10-CM | POA: Diagnosis not present

## 2017-12-06 DIAGNOSIS — J45909 Unspecified asthma, uncomplicated: Secondary | ICD-10-CM | POA: Diagnosis not present

## 2017-12-06 DIAGNOSIS — N730 Acute parametritis and pelvic cellulitis: Secondary | ICD-10-CM | POA: Diagnosis not present

## 2017-12-06 DIAGNOSIS — D271 Benign neoplasm of left ovary: Secondary | ICD-10-CM | POA: Diagnosis not present

## 2017-12-06 DIAGNOSIS — E890 Postprocedural hypothyroidism: Secondary | ICD-10-CM | POA: Diagnosis not present

## 2017-12-06 DIAGNOSIS — Z91041 Radiographic dye allergy status: Secondary | ICD-10-CM | POA: Insufficient documentation

## 2017-12-06 DIAGNOSIS — N736 Female pelvic peritoneal adhesions (postinfective): Secondary | ICD-10-CM | POA: Diagnosis not present

## 2017-12-06 DIAGNOSIS — F419 Anxiety disorder, unspecified: Secondary | ICD-10-CM | POA: Diagnosis not present

## 2017-12-06 DIAGNOSIS — Z825 Family history of asthma and other chronic lower respiratory diseases: Secondary | ICD-10-CM | POA: Diagnosis not present

## 2017-12-06 HISTORY — PX: LAPAROSCOPIC VAGINAL HYSTERECTOMY WITH SALPINGO OOPHORECTOMY: SHX6681

## 2017-12-06 LAB — PREGNANCY, URINE: PREG TEST UR: NEGATIVE

## 2017-12-06 SURGERY — HYSTERECTOMY, VAGINAL, LAPAROSCOPY-ASSISTED, WITH SALPINGO-OOPHORECTOMY
Anesthesia: General | Laterality: Bilateral

## 2017-12-06 MED ORDER — METOPROLOL TARTRATE 5 MG/5ML IV SOLN
INTRAVENOUS | Status: DC | PRN
  Administered 2017-12-06: 2 mg via INTRAVENOUS

## 2017-12-06 MED ORDER — LIDOCAINE-EPINEPHRINE 1 %-1:100000 IJ SOLN
INTRAMUSCULAR | Status: DC | PRN
  Administered 2017-12-06: 10 mL

## 2017-12-06 MED ORDER — LACTATED RINGERS IV SOLN
INTRAVENOUS | Status: DC
  Administered 2017-12-06: 14:00:00 via INTRAVENOUS

## 2017-12-06 MED ORDER — FENTANYL CITRATE (PF) 250 MCG/5ML IJ SOLN
INTRAMUSCULAR | Status: AC
  Filled 2017-12-06: qty 5

## 2017-12-06 MED ORDER — ONDANSETRON HCL 4 MG/2ML IJ SOLN
4.0000 mg | Freq: Four times a day (QID) | INTRAMUSCULAR | Status: DC | PRN
  Administered 2017-12-06: 4 mg via INTRAVENOUS
  Filled 2017-12-06: qty 2

## 2017-12-06 MED ORDER — METOCLOPRAMIDE HCL 5 MG/ML IJ SOLN
10.0000 mg | Freq: Once | INTRAMUSCULAR | Status: AC | PRN
Start: 2017-12-06 — End: 2017-12-06
  Administered 2017-12-06: 10 mg via INTRAVENOUS

## 2017-12-06 MED ORDER — LACTATED RINGERS IV SOLN: INTRAVENOUS | Status: DC

## 2017-12-06 MED ORDER — SODIUM CHLORIDE 0.9 % IJ SOLN
INTRAMUSCULAR | Status: AC
  Filled 2017-12-06: qty 10

## 2017-12-06 MED ORDER — SUGAMMADEX SODIUM 200 MG/2ML IV SOLN
INTRAVENOUS | Status: DC | PRN
  Administered 2017-12-06: 215 mg via INTRAVENOUS

## 2017-12-06 MED ORDER — BUPIVACAINE HCL (PF) 0.25 % IJ SOLN
INTRAMUSCULAR | Status: AC
  Filled 2017-12-06: qty 30

## 2017-12-06 MED ORDER — LIDOCAINE-EPINEPHRINE 1 %-1:100000 IJ SOLN
INTRAMUSCULAR | Status: AC
  Filled 2017-12-06: qty 1

## 2017-12-06 MED ORDER — DEXAMETHASONE SODIUM PHOSPHATE 4 MG/ML IJ SOLN
INTRAMUSCULAR | Status: AC
  Filled 2017-12-06: qty 1

## 2017-12-06 MED ORDER — KETOROLAC TROMETHAMINE 30 MG/ML IJ SOLN
30.0000 mg | Freq: Four times a day (QID) | INTRAMUSCULAR | Status: DC
  Administered 2017-12-06 – 2017-12-07 (×4): 30 mg via INTRAVENOUS
  Filled 2017-12-06 (×4): qty 1

## 2017-12-06 MED ORDER — PROPOFOL 10 MG/ML IV BOLUS
INTRAVENOUS | Status: DC | PRN
  Administered 2017-12-06: 200 mg via INTRAVENOUS

## 2017-12-06 MED ORDER — MIDAZOLAM HCL 2 MG/2ML IJ SOLN
INTRAMUSCULAR | Status: AC
  Filled 2017-12-06: qty 2

## 2017-12-06 MED ORDER — MIDAZOLAM HCL 2 MG/2ML IJ SOLN
INTRAMUSCULAR | Status: DC | PRN
  Administered 2017-12-06: 2 mg via INTRAVENOUS

## 2017-12-06 MED ORDER — SUGAMMADEX SODIUM 200 MG/2ML IV SOLN
INTRAVENOUS | Status: AC
  Filled 2017-12-06: qty 2

## 2017-12-06 MED ORDER — SCOPOLAMINE 1 MG/3DAYS TD PT72
1.0000 | MEDICATED_PATCH | Freq: Once | TRANSDERMAL | Status: DC
  Administered 2017-12-06: 1.5 mg via TRANSDERMAL

## 2017-12-06 MED ORDER — LACTATED RINGERS IV SOLN
INTRAVENOUS | Status: DC
  Administered 2017-12-06 (×3): via INTRAVENOUS

## 2017-12-06 MED ORDER — LIDOCAINE HCL (CARDIAC) 20 MG/ML IV SOLN
INTRAVENOUS | Status: AC
Start: 2017-12-06 — End: ?
  Filled 2017-12-06: qty 5

## 2017-12-06 MED ORDER — DEXAMETHASONE SODIUM PHOSPHATE 10 MG/ML IJ SOLN
INTRAMUSCULAR | Status: DC | PRN
  Administered 2017-12-06: 4 mg via INTRAVENOUS

## 2017-12-06 MED ORDER — HYDROMORPHONE HCL 1 MG/ML IJ SOLN
1.0000 mg | INTRAMUSCULAR | Status: DC | PRN
  Administered 2017-12-06: 2 mg via INTRAVENOUS
  Filled 2017-12-06 (×2): qty 2

## 2017-12-06 MED ORDER — BUPIVACAINE HCL (PF) 0.25 % IJ SOLN
INTRAMUSCULAR | Status: DC | PRN
  Administered 2017-12-06: 9 mL

## 2017-12-06 MED ORDER — GENTAMICIN SULFATE 40 MG/ML IJ SOLN
INTRAVENOUS | Status: AC
  Administered 2017-12-06: 100 mL via INTRAVENOUS
  Filled 2017-12-06: qty 9.25

## 2017-12-06 MED ORDER — PHENYLEPHRINE HCL 10 MG/ML IJ SOLN
INTRAMUSCULAR | Status: DC | PRN
  Administered 2017-12-06: 40 ug via INTRAVENOUS

## 2017-12-06 MED ORDER — ONDANSETRON HCL 4 MG PO TABS: 4.0000 mg | ORAL_TABLET | Freq: Four times a day (QID) | ORAL | Status: DC | PRN

## 2017-12-06 MED ORDER — KETOROLAC TROMETHAMINE 30 MG/ML IJ SOLN: 30.0000 mg | Freq: Four times a day (QID) | INTRAMUSCULAR | Status: DC

## 2017-12-06 MED ORDER — ROCURONIUM BROMIDE 100 MG/10ML IV SOLN
INTRAVENOUS | Status: AC
  Filled 2017-12-06: qty 1

## 2017-12-06 MED ORDER — PROPOFOL 10 MG/ML IV BOLUS
INTRAVENOUS | Status: AC
  Filled 2017-12-06: qty 20

## 2017-12-06 MED ORDER — ONDANSETRON HCL 4 MG/2ML IJ SOLN
INTRAMUSCULAR | Status: AC
  Filled 2017-12-06: qty 2

## 2017-12-06 MED ORDER — HYDROMORPHONE HCL 1 MG/ML IJ SOLN
INTRAMUSCULAR | Status: AC
  Administered 2017-12-06: 0.5 mg via INTRAVENOUS
  Filled 2017-12-06: qty 1

## 2017-12-06 MED ORDER — METOCLOPRAMIDE HCL 5 MG/ML IJ SOLN
INTRAMUSCULAR | Status: AC
  Filled 2017-12-06: qty 2

## 2017-12-06 MED ORDER — SCOPOLAMINE 1 MG/3DAYS TD PT72
MEDICATED_PATCH | TRANSDERMAL | Status: AC
  Administered 2017-12-06: 1.5 mg via TRANSDERMAL
  Filled 2017-12-06: qty 1

## 2017-12-06 MED ORDER — ROCURONIUM BROMIDE 100 MG/10ML IV SOLN
INTRAVENOUS | Status: DC | PRN
  Administered 2017-12-06: 50 mg via INTRAVENOUS
  Administered 2017-12-06: 10 mg via INTRAVENOUS

## 2017-12-06 MED ORDER — OXYCODONE-ACETAMINOPHEN 5-325 MG PO TABS: 1.0000 | ORAL_TABLET | ORAL | Status: DC | PRN

## 2017-12-06 MED ORDER — FENTANYL CITRATE (PF) 100 MCG/2ML IJ SOLN
INTRAMUSCULAR | Status: DC | PRN
  Administered 2017-12-06 (×2): 50 ug via INTRAVENOUS
  Administered 2017-12-06: 75 ug via INTRAVENOUS
  Administered 2017-12-06: 50 ug via INTRAVENOUS
  Administered 2017-12-06: 100 ug via INTRAVENOUS
  Administered 2017-12-06: 25 ug via INTRAVENOUS
  Administered 2017-12-06: 50 ug via INTRAVENOUS

## 2017-12-06 MED ORDER — MEPERIDINE HCL 25 MG/ML IJ SOLN: 6.2500 mg | INTRAMUSCULAR | Status: DC | PRN

## 2017-12-06 MED ORDER — HYDROMORPHONE HCL 1 MG/ML IJ SOLN
0.2500 mg | INTRAMUSCULAR | Status: DC | PRN
  Administered 2017-12-06 (×4): 0.5 mg via INTRAVENOUS

## 2017-12-06 MED ORDER — KETOROLAC TROMETHAMINE 30 MG/ML IJ SOLN
INTRAMUSCULAR | Status: AC
  Filled 2017-12-06: qty 1

## 2017-12-06 MED ORDER — SODIUM CHLORIDE 0.9 % IJ SOLN
INTRAMUSCULAR | Status: DC | PRN
  Administered 2017-12-06: 10 mL via INTRAVENOUS

## 2017-12-06 MED ORDER — 0.9 % SODIUM CHLORIDE (POUR BTL) OPTIME
TOPICAL | Status: DC | PRN
  Administered 2017-12-06: 1000 mL

## 2017-12-06 MED ORDER — LIDOCAINE HCL (CARDIAC) 20 MG/ML IV SOLN
INTRAVENOUS | Status: DC | PRN
  Administered 2017-12-06: 60 mg via INTRAVENOUS

## 2017-12-06 MED ORDER — ONDANSETRON HCL 4 MG/2ML IJ SOLN
INTRAMUSCULAR | Status: DC | PRN
  Administered 2017-12-06: 4 mg via INTRAVENOUS

## 2017-12-06 MED ORDER — PHENYLEPHRINE 40 MCG/ML (10ML) SYRINGE FOR IV PUSH (FOR BLOOD PRESSURE SUPPORT)
PREFILLED_SYRINGE | INTRAVENOUS | Status: AC
  Filled 2017-12-06: qty 10

## 2017-12-06 SURGICAL SUPPLY — 40 items
CABLE HIGH FREQUENCY MONO STRZ (ELECTRODE) ×3 IMPLANT
CLOSURE WOUND 1/2 X4 (GAUZE/BANDAGES/DRESSINGS) ×1
COVER MAYO STAND STRL (DRAPES) ×3 IMPLANT
DERMABOND ADVANCED (GAUZE/BANDAGES/DRESSINGS) ×2
DERMABOND ADVANCED .7 DNX12 (GAUZE/BANDAGES/DRESSINGS) ×1 IMPLANT
DRSG OPSITE POSTOP 3X4 (GAUZE/BANDAGES/DRESSINGS) ×3 IMPLANT
DURAPREP 26ML APPLICATOR (WOUND CARE) ×3 IMPLANT
FILTER SMOKE EVAC LAPAROSHD (FILTER) ×3 IMPLANT
GLOVE BIO SURGEON STRL SZ 6.5 (GLOVE) ×2 IMPLANT
GLOVE BIO SURGEONS STRL SZ 6.5 (GLOVE) ×1
GLOVE BIOGEL PI IND STRL 7.0 (GLOVE) ×4 IMPLANT
GLOVE BIOGEL PI INDICATOR 7.0 (GLOVE) ×8
LEGGING LITHOTOMY PAIR STRL (DRAPES) ×3 IMPLANT
MANIPULATOR UTERINE 4.5 ZUMI (MISCELLANEOUS) ×3 IMPLANT
NEEDLE INSUFFLATION 150MM (ENDOMECHANICALS) ×3 IMPLANT
NS IRRIG 1000ML POUR BTL (IV SOLUTION) ×3 IMPLANT
PACK LAVH (CUSTOM PROCEDURE TRAY) ×3 IMPLANT
PACK ROBOTIC GOWN (GOWN DISPOSABLE) ×3 IMPLANT
PACK TRENDGUARD 450 HYBRID PRO (MISCELLANEOUS) IMPLANT
PACK TRENDGUARD 600 HYBRD PROC (MISCELLANEOUS) ×1 IMPLANT
PROTECTOR NERVE ULNAR (MISCELLANEOUS) ×6 IMPLANT
SET IRRIG TUBING LAPAROSCOPIC (IRRIGATION / IRRIGATOR) ×3 IMPLANT
SHEARS HARMONIC ACE PLUS 36CM (ENDOMECHANICALS) ×3 IMPLANT
SLEEVE XCEL OPT CAN 5 100 (ENDOMECHANICALS) ×3 IMPLANT
STRIP CLOSURE SKIN 1/2X4 (GAUZE/BANDAGES/DRESSINGS) ×2 IMPLANT
SUT VIC AB 0 CT1 18XCR BRD8 (SUTURE) ×3 IMPLANT
SUT VIC AB 0 CT1 36 (SUTURE) IMPLANT
SUT VIC AB 0 CT1 8-18 (SUTURE) ×6
SUT VIC AB 2-0 CT1 27 (SUTURE) ×4
SUT VIC AB 2-0 CT1 TAPERPNT 27 (SUTURE) ×2 IMPLANT
SUT VIC AB 4-0 PS2 27 (SUTURE) ×6 IMPLANT
SUT VICRYL 0 TIES 12 18 (SUTURE) ×3 IMPLANT
SUT VICRYL 0 UR6 27IN ABS (SUTURE) ×3 IMPLANT
SYR 10ML LL (SYRINGE) ×3 IMPLANT
TOWEL OR 17X24 6PK STRL BLUE (TOWEL DISPOSABLE) ×6 IMPLANT
TRAY FOLEY CATH SILVER 16FR (SET/KITS/TRAYS/PACK) ×3 IMPLANT
TRENDGUARD 450 HYBRID PRO PACK (MISCELLANEOUS)
TRENDGUARD 600 HYBRID PROC PK (MISCELLANEOUS) ×3
TROCAR OPTI TIP 5M 100M (ENDOMECHANICALS) ×3 IMPLANT
WARMER LAPAROSCOPE (MISCELLANEOUS) ×3 IMPLANT

## 2017-12-06 NOTE — Anesthesia Postprocedure Evaluation (Signed)
Anesthesia Post Note  Patient: Janice Roberts  Procedure(s) Performed: LAPAROSCOPIC ASSISTED VAGINAL HYSTERECTOMY WITH SALPINGO OOPHORECTOMY (Bilateral )     Patient location during evaluation: PACU Anesthesia Type: General Level of consciousness: awake and alert and oriented Pain management: pain level controlled Vital Signs Assessment: post-procedure vital signs reviewed and stable Respiratory status: spontaneous breathing, nonlabored ventilation and respiratory function stable Cardiovascular status: blood pressure returned to baseline and stable Postop Assessment: no apparent nausea or vomiting Anesthetic complications: no    Last Vitals:  Vitals:   12/06/17 1115 12/06/17 1130  BP: 124/77 120/82  Pulse: 91 92  Resp: 14 13  Temp:    SpO2: 96% 97%    Last Pain:  Vitals:   12/06/17 1130  TempSrc:   PainSc: 9    Pain Goal: Patients Stated Pain Goal: 3 (12/06/17 8016)               Sho Salguero A.

## 2017-12-06 NOTE — H&P (Signed)
Patient ID: Janice Roberts, female   DOB: 1976-04-25, 42 y.o.   MRN: [id number]     Chief Complaint  Patient presents with  . Pelvic Pain    HPI Janice Roberts is a 42 y.o. female.  [id number], Patient's last menstrual period was 03/02/2017.  She has received 3 Lupron depot injections and her pain has worsened in the last few months. She requests definitive surgical management and LAVH/BSO is scheduled today HPI      Past Medical History:  Diagnosis Date  . Anxiety   . Asthma   . Depression   . Hypertension   . Hypothyroidism   . Obesity   . Thyroid disease          Past Surgical History:  Procedure Laterality Date  . APPENDECTOMY    . CESAREAN SECTION    . [place] OF UTERUS  08/08/2008 & 03/08/2013  . SHOULDER SURGERY Left   . THYROIDECTOMY           Family History  Problem Relation Age of Onset  . Cancer Mother 91       Breast  . Depression Mother   . Hearing loss Mother   . Hypertension Mother   . Stroke Mother   . Asthma Brother   . Birth defects Sister     Social History Social History       Tobacco Use  . Smoking status: Never Smoker  . Smokeless tobacco: Never Used  Substance Use Topics  . Alcohol use: No  . Drug use: No         Allergies  Allergen Reactions  . Ivp Dye [Iodinated Diagnostic Agents] Anaphylaxis    "Almost died"  . Penicillins Hives    Has patient had a PCN reaction causing immediate rash, facial/tongue/throat swelling, SOB or lightheadedness with hypotension: No Has patient had a PCN reaction causing severe rash involving mucus membranes or skin necrosis: Yes Has patient had a PCN reaction that required hospitalization No Has patient had a PCN reaction occurring within the last 10 years: No If all of the above answers are "NO", then may proceed with Cephalosporin use.           Current Outpatient Medications  Medication Sig Dispense Refill  . amitriptyline  (ELAVIL) 50 MG tablet Take 1 tablet (50 mg total) by mouth at bedtime. 30 tablet 5  . amLODipine (NORVASC) 5 MG tablet Take 1 tablet (5 mg total) by mouth daily. 90 tablet 2  . benazepril (LOTENSIN) 20 MG tablet Take 1 tablet (20 mg total) by mouth daily. (Patient taking differently: Take 20 mg daily by mouth. ) 30 tablet 0  . ibuprofen (ADVIL,MOTRIN) 800 MG tablet Take 1 tablet (800 mg total) by mouth every 8 (eight) hours as needed. 30 tablet 0  . levothyroxine (SYNTHROID, LEVOTHROID) 112 MCG tablet TAKE 1 TABLET BY MOUTH ONCE DAILY (Patient taking differently: Take 112 mcg by mouth daily before breakfast. TAKE 1 TABLET BY MOUTH ONCE DAILY) 90 tablet 1  . medroxyPROGESTERone (PROVERA) 10 MG tablet Take 2 tablets (20 mg total) by mouth daily. 60 tablet 6  . lidocaine (LIDODERM) 5 % Place 1 patch onto the skin daily. Remove & Discard patch within 12 hours or as directed by MD (Patient not taking: Reported on 09/12/2017) 10 patch 0   No current facility-administered medications for this visit.     Review of Systems Review of Systems  Constitutional: Negative.   Respiratory: Negative.   Gastrointestinal: Positive  for abdominal pain.  Genitourinary: Positive for pelvic pain. Negative for menstrual problem, vaginal bleeding and vaginal discharge.    Blood pressure (!) 137/95, pulse 83, temperature 98.1 F (36.7 C), temperature source Oral, resp. rate 16, height 5\' 2"  (1.575 m), weight 107.5 kg (237 lb), last menstrual period 03/02/2017, SpO2 98 %.   Physical Exam Physical Exam  Constitutional: She appears well-developed. She appears not distressed  Cardiovascular: Normal rate. No respiratory distress Abdomen non distended Psychiatric: She has a normal mood and affect. Her behavior is normal.  Vitals reviewed.   Data Reviewed Office notes Pap results EMBx benign CLINICAL DATA:  Followup previous exam.  History of C-section.  EXAM: TRANSABDOMINAL AND TRANSVAGINAL ULTRASOUND  OF PELVIS  TECHNIQUE: Both transabdominal and transvaginal ultrasound examinations of the pelvis were performed. Transabdominal technique was performed for global imaging of the pelvis including uterus, ovaries, adnexal regions, and pelvic cul-de-sac. It was necessary to proceed with endovaginal exam following the transabdominal exam to visualize the uterus, endometrium and ovaries.  COMPARISON:  11/15/2016  FINDINGS: Uterus  Measurements: 6.8 x 4.7 x 5.7 cm. No fibroids or other mass visualized.  Endometrium  Thickness: 1.9 mm.  No focal abnormality visualized.  Right ovary  Measurements: Not visualized. The previously noted loculated fluid collection in the right adnexal region is no longer visualized.  Left ovary  Measurements: 2.4 x 1.2 x 2.3 cm. Normal appearance/no adnexal mass.  Other findings  No abnormal free fluid.  IMPRESSION: 1. Interval resolution of previously noted loculated appearing fluid collection in the right adnexa. There has also been resolution of complex tubular structure adjacent to the left ovary.   Electronically Signed   By: Kerby Moors M.D.   On: 04/11/2017 16:44 Assessment    Endometriosis and pelvic pain not controlled with adequate trial of Lupron Does not desire to conceive in the future S/p one cesarean and one term vaginal birth   Plan    Candidate for definitive management with LAVH/BSO which she requests. She will return for endometrial biopsy Patient desires surgical management with  LAVH/BSO.  The risks of surgery were discussed in detail with the patient including but not limited to: continued pelvic pain,bleeding which may require transfusion or reoperation; infection which may require prolonged hospitalization or re-hospitalization and antibiotic therapy; injury to bowel, bladder, ureters and major vessels or other surrounding organs; need for additional procedures including laparotomy; thromboembolic  phenomenon, incisional problems and other postoperative or anesthesia complications.  Patient was told that the likelihood that her condition and symptoms will be treated effectively with this surgical management was very high; the postoperative expectations were also discussed in detail. The patient also understands the alternative treatment options which were discussed in full. All questions were answered.   Woodroe Mode, MD 12/06/2017

## 2017-12-06 NOTE — Anesthesia Procedure Notes (Signed)
Procedure Name: Intubation Date/Time: 12/06/2017 7:32 AM Performed by: Hewitt Blade, CRNA Pre-anesthesia Checklist: Patient identified, Emergency Drugs available, Suction available and Patient being monitored Patient Re-evaluated:Patient Re-evaluated prior to induction Oxygen Delivery Method: Circle system utilized Preoxygenation: Pre-oxygenation with 100% oxygen Induction Type: IV induction Ventilation: Mask ventilation without difficulty and Oral airway inserted - appropriate to patient size Laryngoscope Size: Mac and 3 Grade View: Grade I Tube type: Oral Tube size: 7.0 mm Number of attempts: 1 Airway Equipment and Method: Stylet Placement Confirmation: ETT inserted through vocal cords under direct vision,  positive ETCO2 and breath sounds checked- equal and bilateral Secured at: 21 cm Tube secured with: Tape Dental Injury: Teeth and Oropharynx as per pre-operative assessment

## 2017-12-06 NOTE — Anesthesia Preprocedure Evaluation (Addendum)
Anesthesia Evaluation  Patient identified by MRN, date of birth, ID band Patient awake    Reviewed: Allergy & Precautions, NPO status , Patient's Chart, lab work & pertinent test results  Airway Mallampati: II  TM Distance: >3 FB Neck ROM: Full    Dental no notable dental hx. (+) Teeth Intact   Pulmonary asthma ,    Pulmonary exam normal breath sounds clear to auscultation       Cardiovascular hypertension, Pt. on medications Normal cardiovascular exam Rhythm:Regular Rate:Normal     Neuro/Psych PSYCHIATRIC DISORDERS Anxiety negative neurological ROS     GI/Hepatic negative GI ROS, Neg liver ROS,   Endo/Other  Hypothyroidism Morbid obesity  Renal/GU negative Renal ROS  negative genitourinary   Musculoskeletal negative musculoskeletal ROS (+)   Abdominal   Peds  Hematology   Anesthesia Other Findings   Reproductive/Obstetrics Endometriosis  Pelvic pain                             Anesthesia Physical Anesthesia Plan  ASA: III  Anesthesia Plan: General   Post-op Pain Management:    Induction: Intravenous  PONV Risk Score and Plan: Ondansetron, Dexamethasone, Midazolam, Scopolamine patch - Pre-op and Treatment may vary due to age or medical condition  Airway Management Planned: Oral ETT  Additional Equipment:   Intra-op Plan:   Post-operative Plan: Extubation in OR  Informed Consent: I have reviewed the patients History and Physical, chart, labs and discussed the procedure including the risks, benefits and alternatives for the proposed anesthesia with the patient or authorized representative who has indicated his/her understanding and acceptance.   Dental advisory given  Plan Discussed with: CRNA, Anesthesiologist and Surgeon  Anesthesia Plan Comments:        Anesthesia Quick Evaluation

## 2017-12-06 NOTE — Op Note (Signed)
Janice Roberts PROCEDURE DATE: 12/06/2017   PREOPERATIVE DIAGNOSES: Endometriosis with pelvic pain POSTOPERATIVE DIAGNOSES: The same PROCEDURE: Laparoscopic assisted vaginal hysterectomy, bilateral salpingoophorectomy SURGEON:  Woodroe Mode, MD  ASSISTANT: Dr. Arvilla Meres  INDICATIONS: 42 y.o. [id number] with aforementioned preoperative diagnoses here today for definitive surgical management.   Risks of surgery were discussed with the patient including but not limited to: bleeding which may require transfusion or reoperation; infection which may require antibiotics; injury to bowel, bladder, ureters or other surrounding organs; need for additional procedures including laparotomy; thromboembolic phenomenon, incisional problems and other postoperative/anesthesia complications. Written informed consent was obtained.    FINDINGS:  Small uterus, normal adnexa bilaterally.  Evidence of endometriosis,  With omental adhesion to abdominal wall.  Normal upper abdomen.  ANESTHESIA:    General INTRAVENOUS FLUIDS: 1500 ml ESTIMATED BLOOD LOSS: 200 ml SPECIMENS: Uterus, cervix, bilateral fallopian tubes and ovaries. COMPLICATIONS: None immediate   LAVH/BSO: Laparoscopy to just take the adnexa and completing vaginally  PROCEDURE IN DETAIL:  The patient received intravenous antibiotics and had sequential compression devices applied to her lower extremities while in the preoperative area.  She was then taken to the operating room where general anesthesia was administered and was found to be adequate.  She was placed in the dorsal lithotomy position, and was prepped and draped in a sterile manner.  A Foley catheter was inserted into her bladder and attached to constant drainage and a uterine manipulator was then advanced into the uterus.  After an adequate timeout was performed, attention was turned to the abdomen where an umbilical incision was made with the scalpel.  The 5-mm trocar and sleeve were then  advanced without difficulty with the laparoscope after insufflation with the Veress needle into the abdomen.   Bilateral 5-mm lower quadrant ports were then placed under direct visualization.  A survey of the patient's pelvis and abdomen revealed the findings as above.  The round ligaments were clamped and transected with the Harmonic device on both sides. The bilateral infundibulopelvic ligaments were also clamped and transected with the Harmonic device. The bladder was dissected off the anterior uterus. Uterine vessels were transected Excellent hemostasis was noted, the decision was made to leave the trocars in place and proceed with completing the hysterectomy via the vaginal route .  Attention was then turned to her pelvis.  A weighted speculum was then placed in the vagina, and the anterior and posterior lips of the cervix were grasped bilaterally with tenaculums.  The cervix was then injected circumferentially with 0.25% Marcaine solution to maintain hemostasis.  The cervix was then circumferentially incised  The posterior cul-de-sac was entered sharply without difficulty.  A long weighted speculum was inserted into the posterior cul-de-sac.  The Heaney clamp was then used to clamp the uterosacral ligaments on either side.  They were then cut and sutured ligated with 0 Vicryl, and were held with a tag for later identification. Of note, all sutures used in this case were 0 Vicryl unless otherwise noted.   The cardinal ligaments were then clamped, cut and ligated bilaterally. The uterine vessels and broad ligaments were then serially clamped with the Heaney clamps, cut, and suture ligated on both sides.  The uterus was noted to be freed from all ligaments and was then delivered and sent to pathology.   After completion of the hysterectomy, all pedicles from the uterosacral ligament to the cornua were examined hemostasis was confirmed.   The vaginal cuff was then closed with interrupted figure  eight sutures  with 0 Vicryl with care given to incorporate the uterosacral pedicles bilaterally.  All instruments were then removed from the pelvis, and     Attention was then returned to her abdomen which was insufflated again with carbon dioxide gas.  The laparoscope was used to survey the operative site, and it was found to be hemostatic.  Arista was placed in the pelvis after irrigation. No intraoperative injury to other surrounding organs was noted.  The abdomen was desufflated and all instruments were then removed from the patient's abdomen.  All skin incisions were closed with Dermabond. The patient tolerated the procedures well.  All instruments, needles, and sponge counts were correct x 2. The patient was taken to the recovery room awake, extubated and in stable condition.    Alternate method of LAVH/BSO : Laparoscopy down to taking the uterine vessels then vaginal completion. Also going in via [place].  PROCEDURE IN DETAIL:  The patient received intravenous antibiotics and had sequential compression devices applied to her lower extremities while in the preoperative area.  She was then taken to the operating room where general anesthesia was administered and was found to be adequate.  She was placed in the dorsal lithotomy position, and was prepped and draped in a sterile manner.  A uterine manipulator was placed at this time.  A Foley catheter was inserted into her bladder and attached to constant drainage.  After an adequate timeout was performed, attention was turned to the abdomen where an umbilical incision was made with the scalpel.  The Optiview 11-mm trocar and sleeve were then advanced without difficulty with the laparoscope under direct visualization into the abdomen.  The abdomen was then insufflated with carbon dioxide gas and adequate pneumoperitoneum was obtained.  A survey of the patient's pelvis and abdomen revealed the findings above.  Bilateral 5-mm lower quadrant ports were then placed under  direct visualization.  Attention was turned to the infundibulopelvic ligament on the patient's left side, which was clamped using the Harmonic scalpel and ligated.  Good hemostasis was noted. The broad ligament was also ligated with the Harmonic scalpel working towards the round ligament.  The round and rest of the broad ligaments were then clamped and transected with the Harmonic scalpel.   The uterine artery was then skeletonized and a bladder flap was created.  The bladder was then bluntly dissected off the lower uterine segment.  At this point, attention was turned to the uterine vessels, which were clamped and ligated using the Harmonic scalpel.   Good hemostasis was noted overall.   Attention was then turned to the patient's right side, which was treated in a similar manner by taking the infundibulopelvic ligament, the round ligament and the broad ligaments and the bladder flap creation was completed.  The uterine artery was also clamped and transected in a similar fashion.  The ureters were noted to be safely away from the area of dissection.  Excellent hemostasis was noted, the decision was made to leave the trocars in place and proceed with completing the hysterectomy via the vaginal route.  Attention was then turned to her pelvis.  A weighted speculum was then placed in the vagina, and the anterior and posterior lips of the cervix were grasped bilaterally with tenaculums.  The cervix was then injected circumferentially with 0.5% Marcaine with epinephrine solution to maintain hemostasis.  The cervix was then circumferentially incised, and the anterior cul-de-sac was then entered sharply without difficulty and a retractor was placed.  The  same procedure was performed posteriorly and the posterior cul-de-sac was entered sharply without difficulty.  A long weighted speculum was inserted into the posterior cul-de-sac.  The Heaney clamp was then used to clamp the uterosacral ligaments on either side.  They  were then cut and sutured ligated with 0 Vicryl, and were held with a tag for later identification. Of note, all sutures used in this case were 0 Vicryl unless otherwise noted.   The cardinal ligaments were then clamped, cut and ligated bilaterally. The uterus was noted to be freed from all ligaments and was then delivered and sent to pathology.   After completion of the hysterectomy, all pedicles from the uterosacral ligament to the cornua were examined hemostasis was confirmed.   The inferior vaginal cuff was then closed with a reefing stitch; and the entire cuff was reapproximated with 0 Vicryl interrupted stitches with care given to incorporate the uterosacral pedicles bilaterally.  All instruments were then removed from the pelvis.   Attention was then returned to her abdomen which was insufflated again with carbon dioxide gas.  The laparoscope was used to survey the operative site, and it was found to be hemostatic.   No intraoperative injury to other surrounding organs was noted.  The abdomen was desufflated and all instruments were then removed from the patient's abdomen.  The fascial incision of the umbilicus was closed with a 0 Vicryl figure of eight stitch.  All skin incisions were closed with Dermabond. The patient tolerated the procedures well.  All instruments, needles, and sponge counts were correct x 2. The patient was taken to the recovery room awake, extubated and in stable condition.   Woodroe Mode, MD 12/06/2017 10:18 AM

## 2017-12-06 NOTE — Progress Notes (Signed)
Attempted to walk pt. Pt dangled on the side of the bed and began to feel nauseated and dizzy. Pt requested to lie back down. Will try to walk again later.

## 2017-12-06 NOTE — Transfer of Care (Signed)
Immediate Anesthesia Transfer of Care Note  Patient: Janice Roberts  Procedure(s) Performed: LAPAROSCOPIC ASSISTED VAGINAL HYSTERECTOMY WITH SALPINGO OOPHORECTOMY (Bilateral )  Patient Location: PACU  Anesthesia Type:General  Level of Consciousness: awake, alert  and oriented  Airway & Oxygen Therapy: Patient Spontanous Breathing and Patient connected to nasal cannula oxygen  Post-op Assessment: Report given to RN, Post -op Vital signs reviewed and stable and Patient moving all extremities  Post vital signs: Reviewed and stable  Last Vitals:  Vitals:   12/06/17 0632  BP: (!) 137/95  Pulse: 83  Resp: 16  Temp: 36.7 C  SpO2: 98%    Last Pain:  Vitals:   12/06/17 0632  TempSrc: Oral  PainSc: 8       Patients Stated Pain Goal: 3 (58/52/77 8242)  Complications: No apparent anesthesia complications

## 2017-12-07 ENCOUNTER — Encounter (HOSPITAL_COMMUNITY): Payer: Self-pay | Admitting: Obstetrics & Gynecology

## 2017-12-07 DIAGNOSIS — I10 Essential (primary) hypertension: Secondary | ICD-10-CM | POA: Diagnosis not present

## 2017-12-07 DIAGNOSIS — F419 Anxiety disorder, unspecified: Secondary | ICD-10-CM | POA: Diagnosis not present

## 2017-12-07 DIAGNOSIS — N809 Endometriosis, unspecified: Secondary | ICD-10-CM | POA: Diagnosis not present

## 2017-12-07 DIAGNOSIS — F329 Major depressive disorder, single episode, unspecified: Secondary | ICD-10-CM | POA: Diagnosis not present

## 2017-12-07 DIAGNOSIS — J45909 Unspecified asthma, uncomplicated: Secondary | ICD-10-CM | POA: Diagnosis not present

## 2017-12-07 DIAGNOSIS — N888 Other specified noninflammatory disorders of cervix uteri: Secondary | ICD-10-CM | POA: Diagnosis not present

## 2017-12-07 LAB — CBC
HCT: 31.6 % — ABNORMAL LOW (ref 36.0–46.0)
HEMOGLOBIN: 11 g/dL — AB (ref 12.0–15.0)
MCH: 29.6 pg (ref 26.0–34.0)
MCHC: 34.8 g/dL (ref 30.0–36.0)
MCV: 84.9 fL (ref 78.0–100.0)
Platelets: 237 10*3/uL (ref 150–400)
RBC: 3.72 MIL/uL — ABNORMAL LOW (ref 3.87–5.11)
RDW: 14.6 % (ref 11.5–15.5)
WBC: 13.4 10*3/uL — ABNORMAL HIGH (ref 4.0–10.5)

## 2017-12-07 MED ORDER — OXYCODONE-ACETAMINOPHEN 5-325 MG PO TABS: 1.0000 | ORAL_TABLET | ORAL | 0 refills | Status: DC | PRN

## 2017-12-07 NOTE — Discharge Summary (Signed)
Physician Discharge Summary  Patient ID: Janice Roberts MRN: [id number] DOB/AGE: February 23, 1976 42 y.o.  Admit date: 12/06/2017 Discharge date: 12/07/2017  Admission Diagnoses:Pelvic pain and endometriosis  Discharge Diagnoses:  Active Problems:   Endometriosis determined by laparoscopy   Discharged Condition: good  Hospital Course: HPI Janice D Jonesis a 42 y.o.female.[id number], Patient's last menstrual period was 03/02/2017.  She has received 3 Lupron depot injections and her pain has worsened in the last few months. She requests definitive surgical management and LAVH/BSO is scheduled today HPI      Past Medical History:  Diagnosis Date  . Anxiety   . Asthma   . Depression   . Hypertension   . Hypothyroidism   . Obesity   . Thyroid disease          Past Surgical History:  Procedure Laterality Date  . APPENDECTOMY    . CESAREAN SECTION    . [place] OF UTERUS  08/08/2008 & 03/08/2013  . SHOULDER SURGERY Left   . THYROIDECTOMY           Family History  Problem Relation Age of Onset  . Cancer Mother 64   Breast  . Depression Mother   . Hearing loss Mother   . Hypertension Mother   . Stroke Mother   . Asthma Brother   . Birth defects Sister     Social History Social History       Tobacco Use  . Smoking status: Never Smoker  . Smokeless tobacco: Never Used  Substance Use Topics  . Alcohol use: No  . Drug use: No         Allergies  Allergen Reactions  . Ivp Dye [Iodinated Diagnostic Agents] Anaphylaxis    "Almost died"  . Penicillins Hives    Has patient had a PCN reaction causing immediate rash, facial/tongue/throat swelling, SOB or lightheadedness with hypotension: No Has patient had a PCN reaction causing severe rash involving mucus membranes or skin necrosis: Yes Has patient had a PCN reaction that required hospitalization No Has patient had a PCN reaction occurring  within the last 10 years: No If all of the above answers are "NO", then may proceed with Cephalosporin use.           Current Outpatient Medications  Medication Sig Dispense Refill  . amitriptyline (ELAVIL) 50 MG tablet Take 1 tablet (50 mg total) by mouth at bedtime. 30 tablet 5  . amLODipine (NORVASC) 5 MG tablet Take 1 tablet (5 mg total) by mouth daily. 90 tablet 2  . benazepril (LOTENSIN) 20 MG tablet Take 1 tablet (20 mg total) by mouth daily. (Patient taking differently: Take 20 mg daily by mouth. ) 30 tablet 0  . ibuprofen (ADVIL,MOTRIN) 800 MG tablet Take 1 tablet (800 mg total) by mouth every 8 (eight) hours as needed. 30 tablet 0  . levothyroxine (SYNTHROID, LEVOTHROID) 112 MCG tablet TAKE 1 TABLET BY MOUTH ONCE DAILY (Patient taking differently: Take 112 mcg by mouth daily before breakfast. TAKE 1 TABLET BY MOUTH ONCE DAILY) 90 tablet 1  . medroxyPROGESTERone (PROVERA) 10 MG tablet Take 2 tablets (20 mg total) by mouth daily. 60 tablet 6  . lidocaine (LIDODERM) 5 % Place 1 patch onto the skin daily. Remove & Discard patch within 12 hours or as directed by MD (Patient not taking: Reported on 09/12/2017) 10 patch 0   No current facility-administered medications for this visit.    Review of Systems Review of Systems  Constitutional:Negative.  Respiratory:Negative.  Gastrointestinal: Positive forabdominal pain.  Genitourinary: Positive forpelvic pain. Negative formenstrual problem,vaginal bleedingand vaginal discharge.   Blood pressure (!) 137/95, pulse 83, temperature 98.1 F (36.7 C), temperature source Oral, resp. rate 16, height 5\' 2"  (1.575 m), weight 107.5 kg (237 lb), last menstrual period 03/02/2017, SpO2 98 %.   Physical Exam Physical Exam Constitutional: She appearswell-developed. She appearsnot distressed  Cardiovascular:Normal rate. No respiratory distress Abdomen non distended Psychiatric: She has anormal mood and affect.  Herbehavior is normal. Vitalsreviewed.   Data Reviewed Office notes Pap results EMBx benign CLINICAL DATA: Followup previous exam. History of C-section.  EXAM: TRANSABDOMINAL AND TRANSVAGINAL ULTRASOUND OF PELVIS  TECHNIQUE: Both transabdominal and transvaginal ultrasound examinations of the pelvis were performed. Transabdominal technique was performed for global imaging of the pelvis including uterus, ovaries, adnexal regions, and pelvic cul-de-sac. It was necessary to proceed with endovaginal exam following the transabdominal exam to visualize the uterus, endometrium and ovaries.  COMPARISON: 11/15/2016  FINDINGS: Uterus  Measurements: 6.8 x 4.7 x 5.7 cm. No fibroids or other mass visualized.  Endometrium  Thickness: 1.9 mm. No focal abnormality visualized.  Right ovary  Measurements: Not visualized. The previously noted loculated fluid collection in the right adnexal region is no longer visualized.  Left ovary  Measurements: 2.4 x 1.2 x 2.3 cm. Normal appearance/no adnexal mass.  Other findings  No abnormal free fluid.  IMPRESSION: 1. Interval resolution of previously noted loculated appearing fluid collection in the right adnexa. There has also been resolution of complex tubular structure adjacent to the left ovary.   Electronically Signed By: Kerby Moors M.D. On: 04/11/2017 16:44 Assessment  Endometriosis and pelvic pain not controlled with adequate trial of Lupron Does not desire to conceive in the future S/p one cesarean and one term vaginal birth   Plan  Candidate for definitive management with LAVH/BSO which she requests. She will return for endometrial biopsy Patient desires surgical management withLAVH/BSO  PREOPERATIVE DIAGNOSES: Endometriosis with pelvic pain POSTOPERATIVE DIAGNOSES: The same PROCEDURE: Laparoscopic assisted vaginal hysterectomy, bilateral salpingoophorectomy SURGEON:  Woodroe Mode, MD  ASSISTANT: Dr. Arvilla Meres     Consults: None  Significant Diagnostic Studies: labs:  CBC    Component Value Date/Time   WBC 13.4 (H) 12/07/2017 0506   RBC 3.72 (L) 12/07/2017 0506   HGB 11.0 (L) 12/07/2017 0506   HCT 31.6 (L) 12/07/2017 0506   PLT 237 12/07/2017 0506   MCV 84.9 12/07/2017 0506   MCH 29.6 12/07/2017 0506   MCHC 34.8 12/07/2017 0506   RDW 14.6 12/07/2017 0506   LYMPHSABS 3.5 07/14/2016 2034   MONOABS 0.6 07/14/2016 2034   EOSABS 0.1 07/14/2016 2034   BASOSABS 0.0 07/14/2016 2034     Treatments: surgery: LAVH BSO  Discharge Exam: Blood pressure 134/82, pulse 83, temperature 98.7 F (37.1 C), temperature source Oral, resp. rate 18, height 5\' 2"  (1.575 m), weight 237 lb (107.5 kg), last menstrual period 03/02/2017, SpO2 97 %. General appearance: alert, cooperative and no distress GI: soft, non-tender; bowel sounds normal; no masses,  no organomegaly and incisons look good Extremities: extremities normal, atraumatic, no cyanosis or edema  Disposition: 01-Home or Self Care   Allergies as of 12/07/2017      Reactions   Ivp Dye [iodinated Diagnostic Agents] Anaphylaxis, Other (See Comments)   "Almost died"   Penicillins Hives, Other (See Comments)   Has patient had a PCN reaction causing immediate rash, facial/tongue/throat swelling, SOB or lightheadedness with hypotension: No Has patient had a PCN reaction causing  severe rash involving mucus membranes or skin necrosis: Yes Has patient had a PCN reaction that required hospitalization No Has patient had a PCN reaction occurring within the last 10 years: No If all of the above answers are "NO", then may proceed with Cephalosporin use.      Medication List    STOP taking these medications   lidocaine 5 % Commonly known as:  LIDODERM   medroxyPROGESTERone 10 MG tablet Commonly known as:  PROVERA     TAKE these medications   albuterol 108 (90 Base) MCG/ACT inhaler Commonly known as:   PROVENTIL HFA;VENTOLIN HFA Inhale into the lungs every 6 (six) hours as needed for wheezing or shortness of breath.   amitriptyline 50 MG tablet Commonly known as:  ELAVIL Take 1 tablet (50 mg total) by mouth at bedtime.   amLODipine 5 MG tablet Commonly known as:  NORVASC Take 1 tablet (5 mg total) by mouth daily.   benazepril 20 MG tablet Commonly known as:  LOTENSIN Take 1 tablet (20 mg total) by mouth daily. What changed:  how much to take   ibuprofen 800 MG tablet Commonly known as:  ADVIL,MOTRIN Take 1 tablet (800 mg total) by mouth every 8 (eight) hours as needed. What changed:  reasons to take this   levothyroxine 112 MCG tablet Commonly known as:  SYNTHROID, LEVOTHROID TAKE 1 TABLET BY MOUTH ONCE DAILY What changed:    how much to take  how to take this  when to take this  additional instructions   oxyCODONE-acetaminophen 5-325 MG tablet Commonly known as:  PERCOCET/ROXICET Take 1-2 tablets by mouth every 4 (four) hours as needed for moderate pain.      [facility] In 4 weeks.   Specialty:  Obstetrics and Gynecology Contact information: [place] [phone]          Signed: Emeterio Reeve 12/07/2017, 12:35 PM

## 2017-12-07 NOTE — Progress Notes (Signed)
D/c instructions reviewed (adult daughter present) & given; sheet signed.  See education section for details.  Pt to be d/c home in stable condition with daughter.

## 2017-12-07 NOTE — Discharge Instructions (Signed)
Laparoscopically Assisted Vaginal Hysterectomy, Care After °Refer to this sheet in the next few weeks. These instructions provide you with information on caring for yourself after your procedure. Your health care provider may also give you more specific instructions. Your treatment has been planned according to current medical practices, but problems sometimes occur. Call your health care provider if you have any problems or questions after your procedure. °What can I expect after the procedure? °After your procedure, it is typical to have the following: °· Abdominal pain. You will be given pain medicine to control it. °· Sore throat from the breathing tube that was inserted during surgery. ° °Follow these instructions at home: °· Only take over-the-counter or prescription medicines for pain, discomfort, or fever as directed by your health care provider. °· Do not take aspirin. It can cause bleeding. °· Do not drive when taking pain medicine. °· Follow your health care provider's advice regarding diet, exercise, lifting, driving, and general activities. °· Resume your usual diet as directed and allowed. °· Get plenty of rest and sleep. °· Do not douche, use tampons, or have sexual intercourse for at least 6 weeks, or until your health care provider gives you permission. °· Change your bandages (dressings) as directed by your health care provider. °· Monitor your temperature and notify your health care provider of a fever. °· Take showers instead of baths for 2-3 weeks. °· Do not drink alcohol until your health care provider gives you permission. °· If you develop constipation, you may take a mild laxative with your health care provider's permission. Bran foods may help with constipation problems. Drinking enough fluids to keep your urine clear or pale yellow may help as well. °· Try to have someone home with you for 1-2 weeks to help around the house. °· Keep all of your follow-up appointments as directed by your  health care provider. °Contact a health care provider if: °· You have swelling, redness, or increasing pain around your incision sites. °· You have pus coming from your incision. °· You notice a bad smell coming from your incision. °· Your incision breaks open. °· You feel dizzy or lightheaded. °· You have pain or bleeding when you urinate. °· You have persistent diarrhea. °· You have persistent nausea and vomiting. °· You have abnormal vaginal discharge. °· You have a rash. °· You have any type of abnormal reaction or develop an allergy to your medicine. °· You have poor pain control with your prescribed medicine. °Get help right away if: °· You have a fever. °· You have severe abdominal pain. °· You have chest pain. °· You have shortness of breath. °· You faint. °· You have pain, swelling, or redness in your leg. °· You have heavy vaginal bleeding with blood clots. °This information is not intended to replace advice given to you by your health care provider. Make sure you discuss any questions you have with your health care provider. °Document Released: 10/14/2011 Document Revised: 04/01/2016 Document Reviewed: 05/10/2013 °Elsevier Interactive Patient Education © 2017 [employer] ° °

## 2017-12-12 ENCOUNTER — Telehealth: Payer: Self-pay | Admitting: Obstetrics & Gynecology

## 2017-12-12 DIAGNOSIS — R102 Pelvic and perineal pain: Secondary | ICD-10-CM

## 2017-12-12 DIAGNOSIS — G8918 Other acute postprocedural pain: Secondary | ICD-10-CM

## 2017-12-12 NOTE — Telephone Encounter (Signed)
Patient having medication is making her itch need something else

## 2017-12-13 MED ORDER — IBUPROFEN 800 MG PO TABS: 800.0000 mg | ORAL_TABLET | Freq: Three times a day (TID) | ORAL | 0 refills | Status: DC | PRN

## 2017-12-13 NOTE — Telephone Encounter (Signed)
Pt called again, pain med is making her itch.

## 2017-12-13 NOTE — Telephone Encounter (Signed)
Returned patient's call. She states she needs a different pain medicine as the oxycodone is making her itch. She is one week post op LAVH. I advised taking ibuprofen, pt denies receiving an rx for this. Upon checking I noted that the ibuprofen on her profile was prescribed in Oct 2018 with no refills. Sent in new rx for this to her pharmacy. Advised patient to take as scheduled (q8h) for best pain relief. Understanding was voiced and she had no questions.

## 2018-01-11 ENCOUNTER — Encounter: Payer: Self-pay | Admitting: Obstetrics & Gynecology

## 2018-01-11 ENCOUNTER — Ambulatory Visit (INDEPENDENT_AMBULATORY_CARE_PROVIDER_SITE_OTHER): Payer: Self-pay | Admitting: Obstetrics & Gynecology

## 2018-01-11 VITALS — BP 124/90 | HR 95 | Ht 62.0 in | Wt 237.2 lb

## 2018-01-11 DIAGNOSIS — Z9889 Other specified postprocedural states: Secondary | ICD-10-CM

## 2018-01-11 NOTE — Progress Notes (Signed)
Subjective:     Janice Roberts is a 42 y.o. female who presents to the clinic 5 weeks status post laparoscopic assisted vaginal hysterectomy for endometriosis. Eating a regular diet without difficulty. Bowel movements are normal. Pain is controlled without any medications.  The following portions of the patient's history were reviewed and updated as appropriate: allergies, current medications, past family history, past medical history, past social history, past surgical history and problem list.  Review of Systems Pertinent items are noted in HPI.    Objective:    BP 124/90   Pulse 95   Ht 5\' 2"  (1.575 m)   Wt 237 lb 3.2 oz (107.6 kg)   LMP 09/30/2017 (Within Days)   BMI 43.38 kg/m  General:  alert, cooperative and no distress  Abdomen: soft, bowel sounds active, non-tender  Incision:   healing well, no drainage, no erythema, no hernia, no seroma, no swelling, no dehiscence, incision well approximated     Assessment:    Doing well postoperatively. Operative findings again reviewed. Pathology report discussed.    Plan:    1. Continue any current medications. 2. Wound care discussed. 3. Activity restrictions: none 4. Anticipated return to work: 1-2 weeks. 5. Follow [id number] gyn care, no pap smears  Woodroe Mode, MD 01/11/2018

## 2018-01-11 NOTE — Patient Instructions (Signed)
Laparoscopically Assisted Vaginal Hysterectomy, Care After °Refer to this sheet in the next few weeks. These instructions provide you with information on caring for yourself after your procedure. Your health care provider may also give you more specific instructions. Your treatment has been planned according to current medical practices, but problems sometimes occur. Call your health care provider if you have any problems or questions after your procedure. °What can I expect after the procedure? °After your procedure, it is typical to have the following: °· Abdominal pain. You will be given pain medicine to control it. °· Sore throat from the breathing tube that was inserted during surgery. ° °Follow these instructions at home: °· Only take over-the-counter or prescription medicines for pain, discomfort, or fever as directed by your health care provider. °· Do not take aspirin. It can cause bleeding. °· Do not drive when taking pain medicine. °· Follow your health care provider's advice regarding diet, exercise, lifting, driving, and general activities. °· Resume your usual diet as directed and allowed. °· Get plenty of rest and sleep. °· Do not douche, use tampons, or have sexual intercourse for at least 6 weeks, or until your health care provider gives you permission. °· Change your bandages (dressings) as directed by your health care provider. °· Monitor your temperature and notify your health care provider of a fever. °· Take showers instead of baths for 2-3 weeks. °· Do not drink alcohol until your health care provider gives you permission. °· If you develop constipation, you may take a mild laxative with your health care provider's permission. Bran foods may help with constipation problems. Drinking enough fluids to keep your urine clear or pale yellow may help as well. °· Try to have someone home with you for 1-2 weeks to help around the house. °· Keep all of your follow-up appointments as directed by your  health care provider. °Contact a health care provider if: °· You have swelling, redness, or increasing pain around your incision sites. °· You have pus coming from your incision. °· You notice a bad smell coming from your incision. °· Your incision breaks open. °· You feel dizzy or lightheaded. °· You have pain or bleeding when you urinate. °· You have persistent diarrhea. °· You have persistent nausea and vomiting. °· You have abnormal vaginal discharge. °· You have a rash. °· You have any type of abnormal reaction or develop an allergy to your medicine. °· You have poor pain control with your prescribed medicine. °Get help right away if: °· You have a fever. °· You have severe abdominal pain. °· You have chest pain. °· You have shortness of breath. °· You faint. °· You have pain, swelling, or redness in your leg. °· You have heavy vaginal bleeding with blood clots. °This information is not intended to replace advice given to you by your health care provider. Make sure you discuss any questions you have with your health care provider. °Document Released: 10/14/2011 Document Revised: 04/01/2016 Document Reviewed: 05/10/2013 °Elsevier Interactive Patient Education © 2017 [employer] ° °

## 2018-01-18 ENCOUNTER — Other Ambulatory Visit: Payer: Self-pay | Admitting: Physician Assistant

## 2018-01-18 DIAGNOSIS — E079 Disorder of thyroid, unspecified: Secondary | ICD-10-CM

## 2018-02-13 ENCOUNTER — Emergency Department (HOSPITAL_COMMUNITY)
Admission: EM | Admit: 2018-02-13 | Discharge: 2018-02-13 | Disposition: A | Discharge status: Home / Self Care | Payer: Medicare Other | Attending: Emergency Medicine | Admitting: Emergency Medicine

## 2018-02-13 ENCOUNTER — Other Ambulatory Visit: Payer: Self-pay

## 2018-02-13 ENCOUNTER — Encounter (HOSPITAL_COMMUNITY): Payer: Self-pay

## 2018-02-13 ENCOUNTER — Emergency Department (HOSPITAL_COMMUNITY): Payer: Medicare Other

## 2018-02-13 DIAGNOSIS — J45909 Unspecified asthma, uncomplicated: Secondary | ICD-10-CM | POA: Insufficient documentation

## 2018-02-13 DIAGNOSIS — Z79899 Other long term (current) drug therapy: Secondary | ICD-10-CM | POA: Diagnosis not present

## 2018-02-13 DIAGNOSIS — R51 Headache: Secondary | ICD-10-CM | POA: Diagnosis not present

## 2018-02-13 DIAGNOSIS — M5489 Other dorsalgia: Secondary | ICD-10-CM | POA: Insufficient documentation

## 2018-02-13 DIAGNOSIS — M549 Dorsalgia, unspecified: Secondary | ICD-10-CM

## 2018-02-13 DIAGNOSIS — I10 Essential (primary) hypertension: Secondary | ICD-10-CM | POA: Diagnosis not present

## 2018-02-13 DIAGNOSIS — E039 Hypothyroidism, unspecified: Secondary | ICD-10-CM | POA: Diagnosis not present

## 2018-02-13 DIAGNOSIS — R6884 Jaw pain: Secondary | ICD-10-CM | POA: Diagnosis present

## 2018-02-13 DIAGNOSIS — R05 Cough: Secondary | ICD-10-CM | POA: Diagnosis not present

## 2018-02-13 DIAGNOSIS — H6593 Unspecified nonsuppurative otitis media, bilateral: Secondary | ICD-10-CM | POA: Insufficient documentation

## 2018-02-13 DIAGNOSIS — M542 Cervicalgia: Secondary | ICD-10-CM | POA: Diagnosis not present

## 2018-02-13 LAB — BASIC METABOLIC PANEL
Anion gap: 10 (ref 5–15)
BUN: 7 mg/dL (ref 6–20)
CO2: 26 mmol/L (ref 22–32)
Calcium: 9.2 mg/dL (ref 8.9–10.3)
Chloride: 103 mmol/L (ref 101–111)
Creatinine, Ser: 0.76 mg/dL (ref 0.44–1.00)
GFR calc Af Amer: >60 mL/min (ref >60)
GLUCOSE: 116 mg/dL — AB (ref 65–99)
POTASSIUM: 3.2 mmol/L — AB (ref 3.5–5.1)
Sodium: 139 mmol/L (ref 135–145)

## 2018-02-13 LAB — CBC
HEMATOCRIT: 37.6 % (ref 36.0–46.0)
HEMOGLOBIN: 12 g/dL (ref 12.0–15.0)
MCH: 27.8 pg (ref 26.0–34.0)
MCHC: 31.9 g/dL (ref 30.0–36.0)
MCV: 87 fL (ref 78.0–100.0)
Platelets: 277 10*3/uL (ref 150–400)
RBC: 4.32 MIL/uL (ref 3.87–5.11)
RDW: 13.9 % (ref 11.5–15.5)
WBC: 5.8 10*3/uL (ref 4.0–10.5)

## 2018-02-13 LAB — I-STAT TROPONIN, ED: Troponin i, poc: 0 ng/mL (ref 0.00–0.08)

## 2018-02-13 MED ORDER — METHOCARBAMOL 750 MG PO TABS: 750.0000 mg | ORAL_TABLET | Freq: Three times a day (TID) | ORAL | 0 refills | Status: DC | PRN

## 2018-02-13 NOTE — ED Notes (Signed)
Pt is with the Pt in A6.

## 2018-02-13 NOTE — ED Provider Notes (Signed)
[facility]ER[facility] DEPART[facility] Provider Note   CSN: [id number] Arrival date & time: 02/13/18  1515     History   Chief Complaint Chief Complaint  Patient presents with  . Hypertension  . Jaw Pain    HPI Janice Roberts is a 42 y.o. female with history of seasonal allergies, asthma, depression, who presents today for evaluation of jaw pain and blood pressure.  She reports that she does not normally check her blood pressure, however for the past week she has had mild headache and bilateral jaw pain radiating to her bilateral ears..  As she checked her blood pressure after her symptoms started and her blood pressure was high she is assuming that the high blood pressure is what is causing her symptoms.  She reports that she then went and checked her blood pressure and found that it was mildly elevated.  She went to her primary care doctor who started her on amlodipine.  She reports that she is occasionally taking ibuprofen with her last dose this morning for her muscle pain.  She also reports that she has not had any trauma recently.  Denies visual changes, headache, urinary changes.  She is status post hysterectomy so denies possibility of pregnancy.  She reports that she has seasonal allergies, however is not taking any medications for them at this time.  HPI  Past Medical History:  Diagnosis Date  . Anxiety    no meds  . Asthma    rarely uses inhaler - seasonal with allergies  . Depression    no med  . Hypertension   . Hypothyroidism   . Obesity   . Seasonal allergies   . SVD (spontaneous vaginal delivery) 1997   x   . Thyroid disease     Patient Active Problem List   Diagnosis Date Noted  . Endometriosis determined by laparoscopy 02/11/2017  . PID (acute pelvic inflammatory disease) 11/17/2016  . Pelvic pain 11/15/2016  . Family history of breast cancer in mother 10/21/2016  . Pelvic pain in female 11/10/2015  . Anxiety   . Depression   . Asthma     . Obesity   . Hypertension   . Thyroid disease     Past Surgical History:  Procedure Laterality Date  . APPENDECTOMY    . [place]   x   . [place] OF UTERUS  08/08/2008 & 03/08/2013   2009 missed ab, 2014 bleeding  . LAPAROSCOPIC VAGINAL HYSTERECTOMY WITH SALPINGO OOPHORECTOMY Bilateral 12/06/2017   Procedure: LAPAROSCOPIC ASSISTED VAGINAL HYSTERECTOMY WITH SALPINGO OOPHORECTOMY;  Surgeon: Wood[facility] Mode, MD;  Location: [facility];  Service: Gynecology;  Laterality: Bilateral;  . LAPAROSCOPY N/A 01/24/2017   Procedure: LAPAROSCOPY DIAGNOSTIC;  Surgeon: Wood[facility] Mode, MD;  Location: [facility];  Service: Gynecology;  Laterality: N/A;  . SHOULDER SURGERY Left   . THYROIDECTOMY    . WISDOM TOOTH EXTRACTION       OB History    Gravida  10   Para  2   Term  2   Preterm  0   AB  8   Living  2     SAB  1   TAB  7   Ectopic      Multiple      Live Births  2            Home Medications    Prior to Admission medications   Medication Sig Start Date End Date Taking? Authorizing  Provider  albuterol (PROVENTIL HFA;VENTOLIN HFA) 108 (90 Base) MCG/ACT inhaler Inhale into the lungs every 6 (six) hours as needed for wheezing or shortness of breath.    [provider]  amitriptyline (ELAVIL) 50 MG tablet Take 1 tablet (50 mg total) by mouth at bedtime. 04/13/17   Woodroe Mode, MD  amLODipine (NORVASC) 5 MG tablet Take 1 tablet (5 mg total) by mouth daily. 08/03/17   Dena Billet B, PA-C  benazepril (LOTENSIN) 20 MG tablet Take 1 tablet (20 mg total) by mouth daily. Patient taking differently: Take 10 mg by mouth daily.  07/06/17   [name], PA-C  ibuprofen (ADVIL,MOTRIN) 800 MG tablet Take 1 tablet (800 mg total) by mouth every 8 (eight) hours as needed. 12/13/17   Moss, Amber, DO  levothyroxine (SYNTHROID, LEVOTHROID) 112 MCG tablet TAKE 1 TABLET BY MOUTH ONCE DAILY Patient taking differently: Take 112 mcg by mouth daily before breakfast. TAKE  1 TABLET BY MOUTH ONCE DAILY 07/20/17   [name], PA-C  methocarbamol (ROBAXIN) 750 MG tablet Take 1-2 tablets (750-1,500 mg total) by mouth 3 (three) times daily as needed for muscle spasms. 02/13/18   Lorin Glass, PA-C    Family History Family History  Problem Relation Age of Onset  . Cancer Mother 15       Breast  . Depression Mother   . Hearing loss Mother   . Hypertension Mother   . Stroke Mother   . Asthma Brother   . Birth defects Sister     Social History Social History   Tobacco Use  . Smoking status: Never Smoker  . Smokeless tobacco: Never Used  Substance Use Topics  . Alcohol use: No  . Drug use: No     Allergies   Ivp dye [iodinated diagnostic agents]; Penicillins; and Percocet [oxycodone-acetaminophen]   Review of Systems Review of Systems  Constitutional: Negative for chills, diaphoresis and fever.  HENT: Positive for ear pain. Negative for dental problem, drooling, hearing loss, sinus pain, sneezing and sore throat.   Eyes: Negative for pain and visual disturbance.  Respiratory: Negative for cough, choking, chest tightness and shortness of breath.   Cardiovascular: Negative for chest pain.  Gastrointestinal: Negative for abdominal pain, diarrhea, nausea and vomiting.  Musculoskeletal: Positive for neck pain and neck stiffness. Negative for back pain.  Skin: Negative for rash.  Allergic/Immunologic: Negative for immunocompromised state.  Neurological: Positive for headaches. Negative for dizziness, facial asymmetry and light-headedness.  All other systems reviewed and are negative.    Physical Exam Updated Vital Signs BP (!) 134/101 (BP Location: Left Arm)   Pulse 68   Temp 98.2 F (36.8 C) (Oral)   Resp 16   Ht 5\' 2"  (1.575 m)   Wt 108.4 kg (239 lb)   LMP 09/30/2017 (Within Days)   SpO2 100%   BMI 43.71 kg/m   Physical Exam  Constitutional: She appears well-developed and well-nourished. No distress.  HENT:  Head:  Normocephalic and atraumatic.  Right Ear: External ear and ear canal normal. A middle ear effusion is present.  Left Ear: External ear and ear canal normal. A middle ear effusion is present.  Mouth/Throat: Oropharynx is clear and moist.  Bilateral serous effusions.   Eyes: Conjunctivae are normal. Right eye exhibits no discharge. Left eye exhibits no discharge. No scleral icterus.  Neck: Normal range of motion. Neck supple. Muscular tenderness present. No spinous process tenderness present. No neck rigidity.  Cardiovascular: Normal rate, regular rhythm and  normal heart sounds.  No murmur heard. Pulmonary/Chest: Effort normal and breath sounds normal. No stridor. No respiratory distress.  Abdominal: She exhibits no distension.  Musculoskeletal: She exhibits no edema or deformity.  Patient has significiant pain with palpation of bilateral shoulders, cervical paraspinal muscles.  There is no midline C/T/L spinal tenderness.  Palpation over bilateral cervical paraspinal muscles at the base of the skull both re-creates and exacerbates her reported headache and jaw pain.  Neurological: She is alert. She exhibits normal muscle tone.  Skin: Skin is warm and dry. She is not diaphoretic.  Psychiatric: She has a normal mood and affect. Her behavior is normal.  Nursing note and vitals reviewed.    ED Treatments / Results  Labs (all labs ordered are listed, but only abnormal results are displayed) Labs Reviewed  BASIC METABOLIC PANEL - Abnormal; Notable for the following components:      Result Value   Potassium 3.2 (*)    Glucose, Bld 116 (*)    All other components within normal limits  CBC  I-STAT TROPONIN, ED    EKG EKG Interpretation  Date/Time:  Monday February 13 2018 17:01:11 EDT Ventricular Rate:  70 PR Interval:  140 QRS Duration: 80 QT Interval:  428 QTC Calculation: 462 R Axis:   49 Text Interpretation:  Normal sinus rhythm with sinus arrhythmia Low voltage QRS Nonspecific T  wave abnormality Abnormal ECG Confirmed by Quintella Reichert [phone]) on 02/13/2018 8:14:12 PM   Radiology Dg Chest 2 View  Result Date: 02/13/2018 CLINICAL DATA:  Nonproductive cough for 3 days. Neck and jaw pain for 1 week. Asthma. EXAM: CHEST - 2 VIEW COMPARISON:  08/25/2017 FINDINGS: The heart size and mediastinal contours are within normal limits. Both lungs are clear. The visualized skeletal structures are unremarkable. IMPRESSION: Negative.  No active cardiopulmonary disease. Electronically Signed   By: Earle Gell M.D.   On: 02/13/2018 17:22    Procedures Procedures (including critical care time)  Medications Ordered in ED Medications - No data to display   Initial Impression / Assessment and Plan / ED Course  I have reviewed the triage vital signs and the nursing notes.  Pertinent labs & imaging results that were available during my care of the patient were reviewed by me and considered in my medical decision making (see chart for details).    Patient presents today for concerns of hypertension along with headache/neck pain radiating to her bilateral ears.  She has bilateral serous otitis media consistent with her untreated seasonal allergies, she was instructed to start taking a second generation antihistamine along with nasal steroids.  It was 134/94 at time of discharge.  She was instructed to follow-up with her primary care provider regarding this.  She was also instructed on DASH diet and stated her understanding.  Robaxin and OTC pain meds given  Final Clinical Impressions(s) / ED Diagnoses   Final diagnoses:  Other acute back pain  Hypertension, unspecified type    ED Discharge Orders        Ordered    methocarbamol (ROBAXIN) 750 MG tablet  3 times daily PRN     02/13/18 2012       Ollen Gross 02/13/18 2039    Quintella Reichert, MD 02/13/18 2257

## 2018-02-13 NOTE — Discharge Instructions (Signed)
Please consider taking a daily allergy medication to help with your symptoms.  I suggest a less drowsy 24 hour medication such as allegra, zyrtec or Claritin or the generic version.  You would also benefit from using a nasal steroid such as Nasonex.  Nasonex tends to work better than flonase but you can also try flonase.  Please call your doctor tomorrow morning to schedule an appointment.

## 2018-02-13 NOTE — ED Triage Notes (Signed)
Pt reports that she has been having pain in her neck/ jaw and having BP problems for about a week. States that she was placed on medication but that it has not been helping reporting it at home to be 142/103.Denies NV denies dizziness.

## 2018-02-24 ENCOUNTER — Other Ambulatory Visit: Payer: Self-pay

## 2018-03-04 ENCOUNTER — Encounter (HOSPITAL_BASED_OUTPATIENT_CLINIC_OR_DEPARTMENT_OTHER): Payer: Self-pay

## 2018-03-04 ENCOUNTER — Emergency Department (HOSPITAL_BASED_OUTPATIENT_CLINIC_OR_DEPARTMENT_OTHER): Payer: No Typology Code available for payment source

## 2018-03-04 ENCOUNTER — Emergency Department (HOSPITAL_BASED_OUTPATIENT_CLINIC_OR_DEPARTMENT_OTHER)
Admission: EM | Admit: 2018-03-04 | Discharge: 2018-03-04 | Disposition: A | Discharge status: Home / Self Care | Payer: No Typology Code available for payment source | Attending: Emergency Medicine | Admitting: Emergency Medicine

## 2018-03-04 DIAGNOSIS — S299XXA Unspecified injury of thorax, initial encounter: Secondary | ICD-10-CM | POA: Diagnosis not present

## 2018-03-04 DIAGNOSIS — I10 Essential (primary) hypertension: Secondary | ICD-10-CM | POA: Insufficient documentation

## 2018-03-04 DIAGNOSIS — E039 Hypothyroidism, unspecified: Secondary | ICD-10-CM | POA: Diagnosis not present

## 2018-03-04 DIAGNOSIS — R079 Chest pain, unspecified: Secondary | ICD-10-CM | POA: Insufficient documentation

## 2018-03-04 DIAGNOSIS — J45909 Unspecified asthma, uncomplicated: Secondary | ICD-10-CM | POA: Insufficient documentation

## 2018-03-04 DIAGNOSIS — R030 Elevated blood-pressure reading, without diagnosis of hypertension: Secondary | ICD-10-CM

## 2018-03-04 DIAGNOSIS — Z79899 Other long term (current) drug therapy: Secondary | ICD-10-CM | POA: Insufficient documentation
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
## 2018-04-03 ENCOUNTER — Emergency Department (HOSPITAL_COMMUNITY)
Admission: EM | Admit: 2018-04-03 | Discharge: 2018-04-03 | Disposition: A | Discharge status: Home / Self Care | Payer: Medicare Other | Attending: Emergency Medicine | Admitting: Emergency Medicine

## 2018-04-03 ENCOUNTER — Encounter (HOSPITAL_COMMUNITY): Payer: Self-pay | Admitting: Emergency Medicine

## 2018-04-03 DIAGNOSIS — H1031 Unspecified acute conjunctivitis, right eye: Secondary | ICD-10-CM | POA: Diagnosis not present

## 2018-04-03 DIAGNOSIS — H1089 Other conjunctivitis: Secondary | ICD-10-CM | POA: Diagnosis not present

## 2018-04-03 DIAGNOSIS — I10 Essential (primary) hypertension: Secondary | ICD-10-CM | POA: Diagnosis not present

## 2018-04-03 DIAGNOSIS — Z79899 Other long term (current) drug therapy: Secondary | ICD-10-CM | POA: Diagnosis not present

## 2018-04-03 DIAGNOSIS — E039 Hypothyroidism, unspecified: Secondary | ICD-10-CM | POA: Insufficient documentation

## 2018-04-03 DIAGNOSIS — H5711 Ocular pain, right eye: Secondary | ICD-10-CM | POA: Diagnosis present

## 2018-04-03 DIAGNOSIS — J45909 Unspecified asthma, uncomplicated: Secondary | ICD-10-CM | POA: Diagnosis not present

## 2018-04-03 MED ORDER — ERYTHROMYCIN 5 MG/GM OP OINT
TOPICAL_OINTMENT | Freq: Once | OPHTHALMIC | Status: AC
  Administered 2018-04-03: 1 via OPHTHALMIC
  Filled 2018-04-03: qty 3.5

## 2018-04-03 NOTE — ED Provider Notes (Signed)
[facility] DEPT Provider Note   CSN: [id number] Arrival date & time: 04/03/18  1446     History   Chief Complaint Chief Complaint  Patient presents with  . Eye Drainage    HPI Janice Roberts is a 42 y.o. female presents to the ED with right eye drainage and irritation. Patient was exposed to her grandchildren who were treated for "pink eye" a couple days ago.  HPI  Past Medical History:  Diagnosis Date  . Anxiety    no meds  . Asthma    rarely uses inhaler - seasonal with allergies  . Depression    no med  . Hypertension   . Hypothyroidism   . Obesity   . Seasonal allergies   . SVD (spontaneous vaginal delivery) 1997   x   . Thyroid disease     Patient Active Problem List   Diagnosis Date Noted  . Endometriosis determined by laparoscopy 02/11/2017  . PID (acute pelvic inflammatory disease) 11/17/2016  . Pelvic pain 11/15/2016  . Family history of breast cancer in mother 10/21/2016  . Pelvic pain in female 11/10/2015  . Anxiety   . Depression   . Asthma   . Obesity   . Hypertension   . Thyroid disease     Past Surgical History:  Procedure Laterality Date  . ABDOMINAL HYSTERECTOMY    . APPENDECTOMY    . [place]   x   . [place] OF UTERUS  08/08/2008 & 03/08/2013   2009 missed ab, 2014 bleeding  . LAPAROSCOPIC VAGINAL HYSTERECTOMY WITH SALPINGO OOPHORECTOMY Bilateral 12/06/2017   Procedure: LAPAROSCOPIC ASSISTED VAGINAL HYSTERECTOMY WITH SALPINGO OOPHORECTOMY;  Surgeon: Woodroe Mode, MD;  Location: [facility];  Service: Gynecology;  Laterality: Bilateral;  . LAPAROSCOPY N/A 01/24/2017   Procedure: LAPAROSCOPY DIAGNOSTIC;  Surgeon: Woodroe Mode, MD;  Location: [facility];  Service: Gynecology;  Laterality: N/A;  . SHOULDER SURGERY Left   . THYROIDECTOMY    . WISDOM TOOTH EXTRACTION       OB History    Gravida  10   Para  2   Term  2   Preterm  0   AB  8   Living  2     SAB  1   TAB    7   Ectopic      Multiple      Live Births  2            Home Medications    Prior to Admission medications   Medication Sig Start Date End Date Taking? Authorizing Provider  albuterol (PROVENTIL HFA;VENTOLIN HFA) 108 (90 Base) MCG/ACT inhaler Inhale into the lungs every 6 (six) hours as needed for wheezing or shortness of breath.    [provider]  amitriptyline (ELAVIL) 50 MG tablet Take 1 tablet (50 mg total) by mouth at bedtime. 04/13/17   Woodroe Mode, MD  amLODipine (NORVASC) 5 MG tablet Take 1 tablet (5 mg total) by mouth daily. 08/03/17   Dena Billet B, PA-C  benazepril (LOTENSIN) 20 MG tablet Take 1 tablet (20 mg total) by mouth daily. Patient taking differently: Take 10 mg by mouth daily.  07/06/17   [name], PA-C  ibuprofen (ADVIL,MOTRIN) 800 MG tablet Take 1 tablet (800 mg total) by mouth every 8 (eight) hours as needed. 12/13/17   Moss, Amber, DO  levothyroxine (SYNTHROID, LEVOTHROID) 112 MCG tablet TAKE 1 TABLET BY MOUTH ONCE DAILY Patient taking  differently: Take 112 mcg by mouth daily before breakfast. TAKE 1 TABLET BY MOUTH ONCE DAILY 07/20/17   [name], PA-C  methocarbamol (ROBAXIN) 750 MG tablet Take 1-2 tablets (750-1,500 mg total) by mouth 3 (three) times daily as needed for muscle spasms. 02/13/18   Lorin Glass, PA-C    Family History Family History  Problem Relation Age of Onset  . Cancer Mother 52       Breast  . Depression Mother   . Hearing loss Mother   . Hypertension Mother   . Stroke Mother   . Asthma Brother   . Birth defects Sister     Social History Social History   Tobacco Use  . Smoking status: Never Smoker  . Smokeless tobacco: Never Used  Substance Use Topics  . Alcohol use: No  . Drug use: No     Allergies   Ivp dye [iodinated diagnostic agents]; Oxycodone; Penicillins; and Percocet [oxycodone-acetaminophen]   Review of Systems Review of Systems  Constitutional: Negative for chills and fever.   HENT: Negative.   Eyes: Positive for photophobia, discharge, redness and itching. Negative for visual disturbance.  Respiratory: Negative for shortness of breath.   Gastrointestinal: Negative for abdominal pain, nausea and vomiting.  Musculoskeletal: Negative for myalgias.  Skin: Negative for rash.  Neurological: Negative for light-headedness and headaches.  Hematological: Negative for adenopathy.  Psychiatric/Behavioral: The patient is not nervous/anxious.      Physical Exam Updated Vital Signs BP (!) 139/106 (BP Location: Right Arm)   Pulse 63   Temp 98.1 F (36.7 C) (Oral)   Resp 16   LMP 09/30/2017 (Within Days)   SpO2 100%   Physical Exam  Constitutional: She appears well-developed and well-nourished. No distress.  HENT:  Head: Normocephalic.  Mouth/Throat: Mucous membranes are normal. No uvula swelling. No oropharyngeal exudate, posterior oropharyngeal edema or posterior oropharyngeal erythema.  Eyes: Pupils are equal, round, and reactive to light. EOM are normal. Right eye exhibits exudate. Right conjunctiva is injected.  Neck: Neck supple.  Cardiovascular: Normal rate.  Pulmonary/Chest: Effort normal.  Abdominal: Soft. There is no tenderness.  Musculoskeletal: Normal range of motion.  Neurological: She is alert.  Skin: Skin is warm and dry.  Psychiatric: She has a normal mood and affect. Her behavior is normal.  Nursing note and vitals reviewed.    ED Treatments / Results  Labs (all labs ordered are listed, but only abnormal results are displayed) Labs Reviewed - No data to display  Radiology No results found.  Procedures Procedures (including critical care time)  Medications Ordered in ED Medications  erythromycin ophthalmic ointment (1 application Right Eye Given 04/03/18 1729)     Initial Impression / Assessment and Plan / ED Course  I have reviewed the triage vital signs and the nursing notes. Janice Roberts presents with symptoms consistent  with bacterial conjunctivitis after exposure to her grandchildren with same symptoms. Purulent discharge on  exam.  No entrapment, consensual photophobia.  Presentation non-concerning for iritis, corneal abrasions, or HSV.  No evidence of preseptal or orbital cellulitis.  Patient will be given erythromycin ophthalmic.  Personal hygiene and frequent handwashing discussed.  Patient advised to followup with ophthalmologist for reevaluation in several days..  Patient verbalizes understanding and is agreeable with discharge. Discussed elevated BP and need for f/u.  Final Clinical Impressions(s) / ED Diagnoses   Final diagnoses:  Acute bacterial conjunctivitis of right eye    ED Discharge Orders    None  Debroah Baller [place], [place] 04/03/18 2225    Tegeler, Gwenyth Allegra, MD 04/04/18 [phone]

## 2018-04-03 NOTE — Discharge Instructions (Addendum)
Use the eye ointment 3 times a day for the next week. If symptoms are not improving in the next 24 to 48 hours follow up with the eye doctor.

## 2018-04-03 NOTE — ED Triage Notes (Signed)
Patient here from home with complaints of right eye irritation. Drainage. States that her grandkids were treated for pink eye a couple of days ago.

## 2018-05-22 ENCOUNTER — Encounter: Payer: Self-pay | Admitting: Physician Assistant

## 2018-05-22 ENCOUNTER — Other Ambulatory Visit: Payer: Self-pay

## 2018-05-22 ENCOUNTER — Ambulatory Visit (INDEPENDENT_AMBULATORY_CARE_PROVIDER_SITE_OTHER): Payer: Medicare Other | Admitting: Physician Assistant

## 2018-05-22 VITALS — BP 118/90 | HR 81 | Temp 98.1°F | Resp 18 | Ht 62.0 in | Wt 238.4 lb

## 2018-05-22 DIAGNOSIS — I10 Essential (primary) hypertension: Secondary | ICD-10-CM | POA: Diagnosis not present

## 2018-05-22 DIAGNOSIS — J988 Other specified respiratory disorders: Secondary | ICD-10-CM | POA: Diagnosis not present

## 2018-05-22 DIAGNOSIS — B9689 Other specified bacterial agents as the cause of diseases classified elsewhere: Secondary | ICD-10-CM | POA: Diagnosis not present

## 2018-05-22 DIAGNOSIS — E079 Disorder of thyroid, unspecified: Secondary | ICD-10-CM | POA: Diagnosis not present

## 2018-05-22 DIAGNOSIS — J4520 Mild intermittent asthma, uncomplicated: Secondary | ICD-10-CM

## 2018-05-22 MED ORDER — LEVOTHYROXINE SODIUM 112 MCG PO TABS: ORAL_TABLET | ORAL | 1 refills | Status: DC

## 2018-05-22 MED ORDER — ALBUTEROL SULFATE HFA 108 (90 BASE) MCG/ACT IN AERS: 2.0000 | INHALATION_SPRAY | Freq: Four times a day (QID) | RESPIRATORY_TRACT | 2 refills | Status: DC | PRN

## 2018-05-22 MED ORDER — AZITHROMYCIN 250 MG PO TABS: ORAL_TABLET | ORAL | 0 refills | Status: DC

## 2018-05-22 MED ORDER — AMLODIPINE BESYLATE 5 MG PO TABS: 5.0000 mg | ORAL_TABLET | Freq: Every day | ORAL | 2 refills | Status: DC

## 2018-05-22 MED ORDER — BENAZEPRIL HCL 20 MG PO TABS: 20.0000 mg | ORAL_TABLET | Freq: Every day | ORAL | 0 refills | Status: DC

## 2018-05-22 NOTE — Progress Notes (Signed)
Patient ID: Janice Roberts MRN: [id number], DOB: Mar 08, 1976, 42 y.o. Date of Encounter: _0 @  Chief Complaint:  Chief Complaint  Patient presents with  . Chest Pain    started sunday   . Cough  . Back Pain  . Dizziness  . Shortness of Breath    HPI: 42 y.o. year old female  presents with above.  When I entered room, I discussed that she had finally been coming in routinely but now it has been almost a year since last visit.   She says that she had to pay on a bill here before we would let her schedule follow-up.   Says that is why she is just now coming in.   Reports that she has been out of her benazepril and thyroid medication for over a month.  She also reports that her albuterol inhaler had no refills so she has no inhaler to use currently.  Recently has been having cough productive of phlegm.  Also has felt like she may be having a little bit of wheezing at times over the past week.  She has no other concerns to address today.   Past Medical History:  Diagnosis Date  . Anxiety    no meds  . Asthma    rarely uses inhaler - seasonal with allergies  . Depression    no med  . Hypertension   . Hypothyroidism   . Obesity   . Seasonal allergies   . SVD (spontaneous vaginal delivery) 1997   x   . Thyroid disease      Home Meds: Outpatient Medications Prior to Visit  Medication Sig Dispense Refill  . amLODipine (NORVASC) 5 MG tablet Take 1 tablet (5 mg total) by mouth daily. 90 tablet 2  . amitriptyline (ELAVIL) 50 MG tablet Take 1 tablet (50 mg total) by mouth at bedtime. (Patient not taking: Reported on 05/22/2018) 30 tablet 5  . ibuprofen (ADVIL,MOTRIN) 800 MG tablet Take 1 tablet (800 mg total) by mouth every 8 (eight) hours as needed. (Patient not taking: Reported on 05/22/2018) 30 tablet 0  . methocarbamol (ROBAXIN) 750 MG tablet Take 1-2 tablets (750-1,500 mg total) by mouth 3 (three) times daily as needed for muscle spasms. (Patient not taking: Reported on  05/22/2018) 18 tablet 0  . albuterol (PROVENTIL HFA;VENTOLIN HFA) 108 (90 Base) MCG/ACT inhaler Inhale into the lungs every 6 (six) hours as needed for wheezing or shortness of breath.    . benazepril (LOTENSIN) 20 MG tablet Take 1 tablet (20 mg total) by mouth daily. (Patient not taking: Reported on 05/22/2018) 30 tablet 0  . levothyroxine (SYNTHROID, LEVOTHROID) 112 MCG tablet TAKE 1 TABLET BY MOUTH ONCE DAILY (Patient not taking: Reported on 05/22/2018) 90 tablet 1   No facility-administered medications prior to visit.     Allergies:  Allergies  Allergen Reactions  . Ivp Dye [Iodinated Diagnostic Agents] Anaphylaxis and Other (See Comments)    "Almost died"  . Oxycodone   . Penicillins Hives and Other (See Comments)    Has patient had a PCN reaction causing immediate rash, facial/tongue/throat swelling, SOB or lightheadedness with hypotension: No Has patient had a PCN reaction causing severe rash involving mucus membranes or skin necrosis: Yes Has patient had a PCN reaction that required hospitalization No Has patient had a PCN reaction occurring within the last 10 years: No If all of the above answers are "NO", then may proceed with Cephalosporin use.   Marland Kitchen Percocet [Oxycodone-Acetaminophen] Itching  Social History   Socioeconomic History  . Marital status: Single    Spouse name: Not on file  . Number of children: Not on file  . Years of education: Not on file  . Highest education level: Not on file  Occupational History  . Not on file  Social Needs  . Financial resource strain: Not on file  . Food insecurity:    Worry: Not on file    Inability: Not on file  . Transportation needs:    Medical: Not on file    Non-medical: Not on file  Tobacco Use  . Smoking status: Never Smoker  . Smokeless tobacco: Never Used  Substance and Sexual Activity  . Alcohol use: No  . Drug use: No  . Sexual activity: Not Currently    Birth control/protection: None  Lifestyle  . Physical  activity:    Days per week: Not on file    Minutes per session: Not on file  . Stress: Not on file  Relationships  . Social connections:    Talks on phone: Not on file    Gets together: Not on file    Attends religious service: Not on file    Active member of club or organization: Not on file    Attends meetings of clubs or organizations: Not on file    Relationship status: Not on file  . Intimate partner violence:    Fear of current or ex partner: Not on file    Emotionally abused: Not on file    Physically abused: Not on file    Forced sexual activity: Not on file  Other Topics Concern  . Not on file  Social History Narrative  . Not on file    Family History  Problem Relation Age of Onset  . Cancer Mother 51       Breast  . Depression Mother   . Hearing loss Mother   . Hypertension Mother   . Stroke Mother   . Asthma Brother   . Birth defects Sister      Review of Systems:  See HPI for pertinent ROS. All other ROS negative.    Physical Exam: Blood pressure 118/90, pulse 81, temperature 98.1 F (36.7 C), temperature source Oral, resp. rate 18, height _0  (1.575 m), weight 108.1 kg (238 lb 6.4 oz), last menstrual period 09/30/2017, SpO2 96 %., Body mass index is 43.6 kg/m. General: Obese AAF. Appears in no acute distress. Head: Normocephalic, atraumatic, eyes without discharge, sclera non-icteric, nares are without discharge. Bilateral auditory canals clear, TM's are without perforation, pearly grey and translucent with reflective cone of light bilaterally. Oral cavity moist, posterior pharynx without exudate, erythema, peritonsillar abscess.  Neck: Supple. No thyromegaly. No lymphadenopathy. Lungs: Clear bilaterally to auscultation without wheezes, rales, or rhonchi. Breathing is unlabored. Heart: RRR with S1 S2. No murmurs, rubs, or gallops. Musculoskeletal:  Strength and tone normal for age. Extremities/Skin: Warm and dry.  Neuro: Alert and oriented X 3. Moves  all extremities spontaneously. Gait is normal. CNII-XII grossly in tact. Psych:  Responds to questions appropriately with a normal affect.     ASSESSMENT AND PLAN:  42 y.o. year old female with   1. Bacterial respiratory infection She is to take antibiotic as directed.  Follow-up if symptoms do not resolve within 1 week after completion of antibiotic. - azithromycin (ZITHROMAX) 250 MG tablet; Day 1: Take 2 daily. Days 2 -5: Take 1 daily.  Dispense: 6 tablet; Refill: 0  2. Mild intermittent  asthma without complication She is to use the albuterol inhaler as directed. - albuterol (PROVENTIL HFA;VENTOLIN HFA) 108 (90 Base) MCG/ACT inhaler; Inhale 2 puffs into the lungs every 6 (six) hours as needed for wheezing or shortness of breath.  Dispense: 1 Inhaler; Refill: 2  3. Essential hypertension She is to resume the benazepril 20 mg daily as well as the Norvasc 5 mg daily. She is to return for follow-up in 6 weeks.  We will recheck TSH as well as be met and blood pressure at that visit. - benazepril (LOTENSIN) 20 MG tablet; Take 1 tablet (20 mg total) by mouth daily.  Dispense: 30 tablet; Refill: 0 - amLODipine (NORVASC) 5 MG tablet; Take 1 tablet (5 mg total) by mouth daily.  Dispense: 90 tablet; Refill: 2  4. Thyroid disease She is to resume her thyroid medication at prior dose.  She is to return for follow-up in 6 weeks.  We will recheck TSH at that time. - levothyroxine (SYNTHROID, LEVOTHROID) 112 MCG tablet; TAKE 1 TABLET BY MOUTH ONCE DAILY  Dispense: 90 tablet; Refill: 1   Signed, [address] [place], [place], [facility] 05/22/2018 12:50 PM

## 2018-06-13 ENCOUNTER — Telehealth: Payer: Self-pay

## 2018-06-13 MED ORDER — ALBUTEROL SULFATE 108 (90 BASE) MCG/ACT IN AEPB: 2.0000 | INHALATION_SPRAY | Freq: Four times a day (QID) | RESPIRATORY_TRACT | 2 refills | Status: DC

## 2018-06-13 NOTE — Telephone Encounter (Signed)
Our office received a fax stating Proventil AER HFA is not on patient's drug formulary. They have supplied patient with a temporary 30 day day supply. A suggested alternative is Proair Respiclick HFA. Provider has given verbal to fill whatever the insurance will cover.

## 2018-06-15 ENCOUNTER — Other Ambulatory Visit: Payer: Self-pay

## 2018-06-15 DIAGNOSIS — I10 Essential (primary) hypertension: Secondary | ICD-10-CM

## 2018-06-15 MED ORDER — BENAZEPRIL HCL 20 MG PO TABS: 20.0000 mg | ORAL_TABLET | Freq: Every day | ORAL | 1 refills | Status: DC

## 2018-07-03 ENCOUNTER — Ambulatory Visit (INDEPENDENT_AMBULATORY_CARE_PROVIDER_SITE_OTHER): Payer: Medicare Other | Admitting: Physician Assistant

## 2018-07-03 ENCOUNTER — Encounter: Payer: Self-pay | Admitting: Physician Assistant

## 2018-07-03 VITALS — BP 122/86 | HR 64 | Temp 98.8°F | Resp 20 | Ht 62.0 in | Wt 242.8 lb

## 2018-07-03 DIAGNOSIS — G4700 Insomnia, unspecified: Secondary | ICD-10-CM

## 2018-07-03 DIAGNOSIS — E079 Disorder of thyroid, unspecified: Secondary | ICD-10-CM | POA: Diagnosis not present

## 2018-07-03 DIAGNOSIS — I10 Essential (primary) hypertension: Secondary | ICD-10-CM | POA: Diagnosis not present

## 2018-07-03 MED ORDER — ZOLPIDEM TARTRATE 10 MG PO TABS: 10.0000 mg | ORAL_TABLET | Freq: Every evening | ORAL | 0 refills | Status: DC | PRN

## 2018-07-03 NOTE — Progress Notes (Signed)
Patient ID: Janice Roberts MRN: [id number], DOB: May 04, 1976, 42 y.o. Date of Encounter: @DATE @  Chief Complaint:  Chief Complaint  Patient presents with  . 6 week thyroid follow up  . insomia  . Back Pain    HPI: 42 y.o. year old female  presents with above.    07/06/2017:  She states that she thinks that her high blood pressure is causing her to have this mild/dull headache.  Says that when she went to her OB/GYN they noted the blood pressure was high.  She has not had it checked elsewhere/has not checked at home. She reports that she is taking the benazepril 10 mg daily and is compliant with this. She reports that she is also taking the thyroid medication daily and is also compliant with this.  Also reviewed that depression screening today did show significant depression score. Reviewed that we had discussed long history of depression/psych disorder at prior visit. Today I asked her if she ever had follow-up with psychiatry. I reviewed that she did have a behavioral health note by staff at her OB/GYN office on 02/11/17 and that note also stated to follow-up with a psychiatrist and a psychologist. Today I asked patient where things stood with that and whether she did have follow-up. Says that they had given her a number to contact for a psychiatrist but she then had a contact that person again to find somebody that will take Medicare. Says that she is still waiting to hear back from them to find someone who takes Medicare. Says that if I can provide her someone takes Medicare that would be helpful. Otherwise says that her depression symptoms are stable. She denies any suicidal or homicidal ideation.  AT THAT OV: --Increased benazepril to 20 mg daily.  --Planned F/U 2 weeks to recheck BP and bmet after increasing medication dose. --Checked TSH--was WNL--conted current dose -- I gave her phone number for [facility] [phone]. They accept Medicare. When patient returns for  follow-up visit in 2 weeks I will follow up to see if she has scheduled this appointment and encouraged her to follow up on this.   07/20/2017: Today she reports that she did increase the benazepril to 20 mg. She reports that she has not checked her blood pressure anywhere since last visit here.  Says that she has been out-of-town.  Later during the visit says that she was in [place].  Says she was visiting her fianc.  Says that it is a childhood sweetheart and "they recently found each other again".  Says that she was considering for her and her children to go move there but he thinks he may move here. Says that he recently retired from [employer]. At the end of the visit she even brings him to meet me--- she had told me that he was out in the lobby. Asked her if she followed up with the psychiatrist office. She says that this past Friday she called there and left them a message but has not heard back yet. If she does not hear from them in the next several days I have told her to call them back.  --------A/P AT THAT OV:----------------- 1. Essential hypertension 07/20/2017: I rechecked blood pressure myself and I'm getting even higher than nurse blood pressure check. I'm getting right around 150/104.                   Will have her continue the benazepril 20 mg daily.  Will add Norvasc 5 mg daily.        Today will check bmet since increasing the benazepril dose at last visit.        Have her return for follow-up in 2 weeks to recheck blood pressure after addition of Norvasc.       - amLODipine (NORVASC) 5 MG tablet; Take 1 tablet (5 mg total) by mouth daily.  Dispense: 30 tablet; Refill: 0        - BASIC METABOLIC PANEL WITH GFR  2. Thyroid disease 07/20/2017: TSH was normal at recent lab. Continue current dose.           - levothyroxine (SYNTHROID, LEVOTHROID) 112 MCG tablet; TAKE 1 TABLET BY MOUTH ONCE DAILY  Dispense: 90 tablet; Refill: 1  3.Depression, PTSD 06/2017: Today I gave  her phone number for [facility] [phone]. They accept Medicare. When patient returns for follow-up visit in 2 weeks I will follow up to see if she has scheduled this appointment and encouraged her to follow up on this. 07/20/2017: She reports that she did call there and left a message. Today told her that if she does not hear anything from them within 1 week to call them back again.    08/03/2017: Today she reports that she did add the Norvasc 5 mg daily and has been taking this every day as directed. Is having no lower extremity edema or other adverse effects. Continues to take the benazepril daily as well. Her boyfriend is with her during visit today-- the one who recently came from [place]. Patient is in good spirits and seems very happy. Asked if she had gotten the appointment with psychiatry she says that she actually lost that number so I have written down that phone number on her AVS for her again today and she is to call to schedule that appointment. She has no other concerns to address today.   05/22/2018:  She had OV--see that note for details--that visit addressed Resp Infection.   07/03/2018: She reports that she is taking both the benazepril and amlodipine as directed for her blood pressure.  These are causing no lightheadedness or other adverse effects. Reports that she is also taking her thyroid medication each morning daily. Today I asked about her boyfriend.  She states that "I have gotten rid of him".  "He is back in [place]" She does not seem upset or sad about this.  She has a smile throughout visit today. She reports that she is having significant insomnia. Discussed that sometimes medications prescribed by psychiatrist will affect sleep so I asked about the when she last saw psych.  States that she last saw psych about a year ago but states that that was a therapist.  Says that she was not seeing anyone that prescribes medicine.  States that the only time she was of any  medications for psych issues was when she was hospitalized at behavioral health.  Otherwise has only seen therapist and has not required psych meds.  She states that she currently feels like her mood is stable during the day other than just being tired because she is not getting sleep.  Otherwise is not feeling like she is having any mood changes or anxiety or depression. I then asked about her usual schedule / sleep routine.  States that she gets off work at 10:30 PM.  Says that by the time she gets home and settle down is about 1130 or 12 midnight.  However she remains awake  until around 6 or 7 AM.  Cannot fall asleep until then.  Asked what she is doing from midnight until 7 AM.  Says that she either looks at things on her phone while she is trying to fall asleep or watches TV.  Says that sometimes she does close her eyes, her mind goes back to things from her childhood-- sometimes does not want her close her eyes because of that.  Eventually, will follow sleep around 7 AM.  Usually sleeps until around 10 AM or so will try to fall back to sleep but usually cannot.  Goes to work at [employer].  Works as a Engineer, materials. Asked if she had ever been on any sleep medicines before.  She states that the only medicine she was on was amitriptyline/Elavil but states that that did not work and that she would take more than 1 of them and still could not get any sleep.  She also reports that she has been feeling some discomfort between her shoulder blades and upper neck.  No other concerns today.     Past Medical History:  Diagnosis Date  . Anxiety    no meds  . Asthma    rarely uses inhaler - seasonal with allergies  . Depression    no med  . Hypertension   . Hypothyroidism   . Obesity   . Seasonal allergies   . SVD (spontaneous vaginal delivery) 1997   x   . Thyroid disease      Home Meds: Outpatient Medications Prior to Visit  Medication Sig Dispense Refill  . Albuterol Sulfate (PROAIR RESPICLICK)  476 (90 Base) MCG/ACT AEPB Inhale 2 puffs into the lungs every 6 (six) hours. As needed for wheezing or shortness of breath 1 each 2  . amLODipine (NORVASC) 5 MG tablet Take 1 tablet (5 mg total) by mouth daily. 90 tablet 2  . benazepril (LOTENSIN) 20 MG tablet Take 1 tablet (20 mg total) by mouth daily. 90 tablet 1  . ibuprofen (ADVIL,MOTRIN) 800 MG tablet Take 1 tablet (800 mg total) by mouth every 8 (eight) hours as needed. 30 tablet 0  . levothyroxine (SYNTHROID, LEVOTHROID) 112 MCG tablet TAKE 1 TABLET BY MOUTH ONCE DAILY 90 tablet 1  . amitriptyline (ELAVIL) 50 MG tablet Take 1 tablet (50 mg total) by mouth at bedtime. (Patient not taking: Reported on 05/22/2018) 30 tablet 5  . azithromycin (ZITHROMAX) 250 MG tablet Day 1: Take 2 daily. Days 2 -5: Take 1 daily. 6 tablet 0  . methocarbamol (ROBAXIN) 750 MG tablet Take 1-2 tablets (750-1,500 mg total) by mouth 3 (three) times daily as needed for muscle spasms. (Patient not taking: Reported on 05/22/2018) 18 tablet 0   No facility-administered medications prior to visit.     Allergies:  Allergies  Allergen Reactions  . Ivp Dye [Iodinated Diagnostic Agents] Anaphylaxis and Other (See Comments)    "Almost died"  . Oxycodone   . Penicillins Hives and Other (See Comments)    Has patient had a PCN reaction causing immediate rash, facial/tongue/throat swelling, SOB or lightheadedness with hypotension: No Has patient had a PCN reaction causing severe rash involving mucus membranes or skin necrosis: Yes Has patient had a PCN reaction that required hospitalization No Has patient had a PCN reaction occurring within the last 10 years: No If all of the above answers are "NO", then may proceed with Cephalosporin use.   Marland Kitchen Percocet [Oxycodone-Acetaminophen] Itching    Social History   Socioeconomic History  .  Marital status: Single    Spouse name: Not on file  . Number of children: Not on file  . Years of education: Not on file  . Highest  education level: Not on file  Occupational History  . Not on file  Social Needs  . Financial resource strain: Not on file  . Food insecurity:    Worry: Not on file    Inability: Not on file  . Transportation needs:    Medical: Not on file    Non-medical: Not on file  Tobacco Use  . Smoking status: Never Smoker  . Smokeless tobacco: Never Used  Substance and Sexual Activity  . Alcohol use: No  . Drug use: No  . Sexual activity: Not Currently    Birth control/protection: None  Lifestyle  . Physical activity:    Days per week: Not on file    Minutes per session: Not on file  . Stress: Not on file  Relationships  . Social connections:    Talks on phone: Not on file    Gets together: Not on file    Attends religious service: Not on file    Active member of club or organization: Not on file    Attends meetings of clubs or organizations: Not on file    Relationship status: Not on file  . Intimate partner violence:    Fear of current or ex partner: Not on file    Emotionally abused: Not on file    Physically abused: Not on file    Forced sexual activity: Not on file  Other Topics Concern  . Not on file  Social History Narrative  . Not on file    Family History  Problem Relation Age of Onset  . Cancer Mother 39       Breast  . Depression Mother   . Hearing loss Mother   . Hypertension Mother   . Stroke Mother   . Asthma Brother   . Birth defects Sister      Review of Systems:  See HPI for pertinent ROS. All other ROS negative.    Physical Exam: Blood pressure 122/86, pulse 64, temperature 98.8 F (37.1 C), temperature source Oral, resp. rate 20, height 5\' 2"  (1.575 m), weight 110.1 kg, last menstrual period 09/30/2017, SpO2 99 %., Body mass index is 44.41 kg/m. General:  Obese AAF. Appears in no acute distress. Neck: Supple. No thyromegaly. No lymphadenopathy. No carotid bruits. Lungs: Clear bilaterally to auscultation without wheezes, rales, or rhonchi.  Breathing is unlabored. Heart: RRR with S1 S2. No murmurs, rubs, or gallops. Abdomen: Soft, non-tender, non-distended with normoactive bowel sounds. No hepatomegaly. No rebound/guarding. No obvious abdominal masses. Musculoskeletal:  Strength and tone normal for age. Extremities/Skin: Warm and dry. No LE edema.  Neuro: Alert and oriented X 3. Moves all extremities spontaneously. Gait is normal. CNII-XII grossly in tact. Psych:  Responds to questions appropriately with a normal affect.      ASSESSMENT AND PLAN:  42 y.o. year old female with   1. Essential hypertension 07/20/2017: I rechecked blood pressure myself and I'm getting even higher than nurse blood pressure check. I'm getting right around 150/104.                   Will have her continue the benazepril 20 mg daily.        Will add Norvasc 5 mg daily.        Today will check bmet since increasing the benazepril dose at  last visit.        Have her return for follow-up in 2 weeks to recheck blood pressure after addition of Norvasc.       - amLODipine (NORVASC) 5 MG tablet; Take 1 tablet (5 mg total) by mouth daily.  Dispense: 30 tablet; Refill: 0        - BASIC METABOLIC PANEL WITH GFR 1/47/8295: Blood pressure is now controlled. She will continue current medications without any additional change. Astepro 20 mg daily. Continue Norvasc 5 mg daily. 07/03/2018: Blood Pressure is controlled.  Continue current medications.  Benazepril 20 mg daily.  Norvasc 5 mg daily.  Check lab to monitor.  2. Thyroid disease 07/20/2017: TSH was normal at recent lab. Continue current dose.           - levothyroxine (SYNTHROID, LEVOTHROID) 112 MCG tablet; TAKE 1 TABLET BY MOUTH ONCE DAILY  Dispense: 90 tablet; Refill: 1 07/03/2018: She reports she is taking her levothyroxine daily.  Check TSH to monitor.   3. Insomnia, unspecified type 07/03/2018: I am somewhat concerned that her insomnia may be related to her psych history.  However she is stating that  she feels her mood is stable.  Does not report feeling mania as part of bipolar as cause of her insomnia.  Encouraged follow-up with her's therapist.  Also I will try Ambien but have told her that if this causes any adverse effects or if it is not effective to call me for follow-up immediately.  She voices understanding and agrees.  Discussed for her to go ahead and take this as soon as she gets home from work.  Discussed to take this when she has full 8 hours that she can sleep. - zolpidem (AMBIEN) 10 MG tablet; Take 1 tablet (10 mg total) by mouth at bedtime as needed for sleep.  Dispense: 30 tablet; Refill: 0  4. Depression, PTSD 06/2017: Today I gave her phone number for [facility] [phone]. They accept Medicare. When patient returns for follow-up visit in 2 weeks I will follow up to see if she has scheduled this appointment and encouraged her to follow up on this. 07/20/2017: She reports that she did call there and left a message. Today told her that if she does not hear anything from them within 1 week to call them back again. 08/03/17: She reports that she actually lost that phone number and has not followed up with getting that appointment. Day I have written down the phone number and contact information for her again. ----------------Today I gave her phone number for [facility] [phone]. They accept Medicare. 07/03/2018: See HPI from today and #3 above from today.    Signed,    [address] [place], [place], [facility] 07/03/2018 9:41 AM

## 2018-07-04 LAB — BASIC METABOLIC PANEL WITH GFR
BUN: 20 mg/dL (ref 7–25)
CO2: 26 mmol/L (ref 20–32)
CREATININE: 0.94 mg/dL (ref 0.50–1.10)
Calcium: 9.5 mg/dL (ref 8.6–10.2)
Chloride: 107 mmol/L (ref 98–110)
GFR, EST AFRICAN AMERICAN: 86 mL/min/{1.73_m2} (ref >=60)
GFR, Est Non African American: 74 mL/min/{1.73_m2} (ref >=60)
Glucose, Bld: 104 mg/dL — ABNORMAL HIGH (ref 65–99)
Potassium: 4.1 mmol/L (ref 3.5–5.3)
SODIUM: 141 mmol/L (ref 135–146)

## 2018-07-04 LAB — TSH: TSH: 0.43 mIU/L

## 2018-07-19 ENCOUNTER — Inpatient Hospital Stay (HOSPITAL_COMMUNITY)
Admission: AD | Admit: 2018-07-19 | Discharge: 2018-07-20 | Disposition: A | Discharge status: Home / Self Care | Payer: Medicare Other | Source: Ambulatory Visit | Attending: Obstetrics & Gynecology | Admitting: Obstetrics & Gynecology

## 2018-07-19 DIAGNOSIS — Z880 Allergy status to penicillin: Secondary | ICD-10-CM | POA: Insufficient documentation

## 2018-07-19 DIAGNOSIS — N760 Acute vaginitis: Secondary | ICD-10-CM | POA: Insufficient documentation

## 2018-07-19 DIAGNOSIS — Z885 Allergy status to narcotic agent status: Secondary | ICD-10-CM | POA: Insufficient documentation

## 2018-07-19 DIAGNOSIS — Z91041 Radiographic dye allergy status: Secondary | ICD-10-CM | POA: Insufficient documentation

## 2018-07-19 DIAGNOSIS — E039 Hypothyroidism, unspecified: Secondary | ICD-10-CM | POA: Insufficient documentation

## 2018-07-19 DIAGNOSIS — I10 Essential (primary) hypertension: Secondary | ICD-10-CM | POA: Insufficient documentation

## 2018-07-19 DIAGNOSIS — Z79899 Other long term (current) drug therapy: Secondary | ICD-10-CM | POA: Insufficient documentation

## 2018-07-19 DIAGNOSIS — N898 Other specified noninflammatory disorders of vagina: Secondary | ICD-10-CM

## 2018-07-19 DIAGNOSIS — B9689 Other specified bacterial agents as the cause of diseases classified elsewhere: Secondary | ICD-10-CM

## 2018-07-19 NOTE — MAU Note (Addendum)
Pt here for vaginal irritation that started 3 weeks ago. States she used Monistat when it started. Did not help. States she used a new soap which made the pain worse. Pt denies discharge or vaginal bleeding. Had a hysterectomy in January 2019.

## 2018-07-20 ENCOUNTER — Encounter (HOSPITAL_COMMUNITY): Payer: Self-pay | Admitting: *Deleted

## 2018-07-20 DIAGNOSIS — B9689 Other specified bacterial agents as the cause of diseases classified elsewhere: Secondary | ICD-10-CM

## 2018-07-20 DIAGNOSIS — N898 Other specified noninflammatory disorders of vagina: Secondary | ICD-10-CM

## 2018-07-20 DIAGNOSIS — E039 Hypothyroidism, unspecified: Secondary | ICD-10-CM | POA: Diagnosis not present

## 2018-07-20 DIAGNOSIS — N760 Acute vaginitis: Secondary | ICD-10-CM | POA: Diagnosis not present

## 2018-07-20 DIAGNOSIS — Z880 Allergy status to penicillin: Secondary | ICD-10-CM | POA: Diagnosis not present

## 2018-07-20 DIAGNOSIS — Z885 Allergy status to narcotic agent status: Secondary | ICD-10-CM | POA: Diagnosis not present

## 2018-07-20 DIAGNOSIS — Z91041 Radiographic dye allergy status: Secondary | ICD-10-CM | POA: Diagnosis not present

## 2018-07-20 DIAGNOSIS — Z79899 Other long term (current) drug therapy: Secondary | ICD-10-CM | POA: Diagnosis not present

## 2018-07-20 DIAGNOSIS — I10 Essential (primary) hypertension: Secondary | ICD-10-CM | POA: Diagnosis not present

## 2018-07-20 LAB — WET PREP, GENITAL
Sperm: NONE SEEN
Trich, Wet Prep: NONE SEEN
Yeast Wet Prep HPF POC: NONE SEEN

## 2018-07-20 LAB — URINALYSIS, ROUTINE W REFLEX MICROSCOPIC
Bilirubin Urine: NEGATIVE
Glucose, UA: NEGATIVE mg/dL
Hgb urine dipstick: NEGATIVE
Ketones, ur: NEGATIVE mg/dL
Leukocytes, UA: NEGATIVE
Nitrite: NEGATIVE
Protein, ur: NEGATIVE mg/dL
Specific Gravity, Urine: 1.015 (ref 1.005–1.030)
pH: 5.5 (ref 5.0–8.0)

## 2018-07-20 MED ORDER — TINIDAZOLE 500 MG PO TABS: 2.0000 g | ORAL_TABLET | Freq: Every day | ORAL | 0 refills | Status: DC

## 2018-07-20 NOTE — Discharge Instructions (Signed)

## 2018-07-20 NOTE — MAU Provider Note (Signed)
History     CSN: [id number]  Arrival date and time: 07/19/18 2330   First Provider Initiated Contact with Patient 07/20/18 0028      Chief Complaint  Patient presents with  . Vaginal Pain    irritation   Janice Roberts is a 42 y.o. G10P2  Not currently pregnant who presents to MAU with complaints of vaginal irritation. She reports irritation started occurring 3 weeks ago. She reports having intercourse for the first time 3 weeks ago after her hysterectomy in January 2019. She reports this being with a new partner. Hx of yeast infections after intercourse due to the condoms. She reports using Monistat 7 one week after having intercourse due to irritation and complains irritation got worse 1 week ago. She describes the irritation as pressure and pain around her labia, she denies vaginal discharge, vaginal itching or urinary symptoms.    OB History    Gravida  10   Para  2   Term  2   Preterm  0   AB  8   Living  2     SAB  1   TAB  7   Ectopic      Multiple      Live Births  2           Past Medical History:  Diagnosis Date  . Anxiety    no meds  . Asthma    rarely uses inhaler - seasonal with allergies  . Depression    no med  . Hypertension   . Hypothyroidism   . Obesity   . Seasonal allergies   . SVD (spontaneous vaginal delivery) 1997   x   . Thyroid disease     Past Surgical History:  Procedure Laterality Date  . ABDOMINAL HYSTERECTOMY    . APPENDECTOMY    . [place]   x   . [place] OF UTERUS  08/08/2008 & 03/08/2013   2009 missed ab, 2014 bleeding  . LAPAROSCOPIC VAGINAL HYSTERECTOMY WITH SALPINGO OOPHORECTOMY Bilateral 12/06/2017   Procedure: LAPAROSCOPIC ASSISTED VAGINAL HYSTERECTOMY WITH SALPINGO OOPHORECTOMY;  Surgeon: Woodroe Mode, MD;  Location: [facility];  Service: Gynecology;  Laterality: Bilateral;  . LAPAROSCOPY N/A 01/24/2017   Procedure: LAPAROSCOPY DIAGNOSTIC;  Surgeon: Woodroe Mode, MD;  Location:  [facility];  Service: Gynecology;  Laterality: N/A;  . SHOULDER SURGERY Left   . THYROIDECTOMY    . WISDOM TOOTH EXTRACTION      Family History  Problem Relation Age of Onset  . Cancer Mother 94       Breast  . Depression Mother   . Hearing loss Mother   . Hypertension Mother   . Stroke Mother   . Asthma Brother   . Birth defects Sister     Social History   Tobacco Use  . Smoking status: Never Smoker  . Smokeless tobacco: Never Used  Substance Use Topics  . Alcohol use: No  . Drug use: No    Allergies:  Allergies  Allergen Reactions  . Ivp Dye [Iodinated Diagnostic Agents] Anaphylaxis and Other (See Comments)    "Almost died"  . Oxycodone   . Penicillins Hives and Other (See Comments)    Has patient had a PCN reaction causing immediate rash, facial/tongue/throat swelling, SOB or lightheadedness with hypotension: No Has patient had a PCN reaction causing severe rash involving mucus membranes or skin necrosis: Yes Has patient had a PCN reaction that required hospitalization No Has patient  had a PCN reaction occurring within the last 10 years: No If all of the above answers are "NO", then may proceed with Cephalosporin use.   Marland Kitchen Percocet [Oxycodone-Acetaminophen] Itching    Medications Prior to Admission  Medication Sig Dispense Refill Last Dose  . Albuterol Sulfate (PROAIR RESPICLICK) 536 (90 Base) MCG/ACT AEPB Inhale 2 puffs into the lungs every 6 (six) hours. As needed for wheezing or shortness of breath 1 each 2 07/20/2018 at Unknown time  . amLODipine (NORVASC) 5 MG tablet Take 1 tablet (5 mg total) by mouth daily. 90 tablet 2 07/20/2018 at Unknown time  . benazepril (LOTENSIN) 20 MG tablet Take 1 tablet (20 mg total) by mouth daily. 90 tablet 1 07/20/2018 at Unknown time  . levothyroxine (SYNTHROID, LEVOTHROID) 112 MCG tablet TAKE 1 TABLET BY MOUTH ONCE DAILY 90 tablet 1 07/20/2018 at Unknown time  . zolpidem (AMBIEN) 10 MG tablet Take 1 tablet (10 mg total) by mouth at  bedtime as needed for sleep. 30 tablet 0 07/20/2018 at Unknown time  . ibuprofen (ADVIL,MOTRIN) 800 MG tablet Take 1 tablet (800 mg total) by mouth every 8 (eight) hours as needed. 30 tablet 0 More than a month at Unknown time    Review of Systems  Constitutional: Negative.   Respiratory: Negative.   Cardiovascular: Negative.   Gastrointestinal: Negative.   Genitourinary: Positive for vaginal pain. Negative for difficulty urinating, dysuria, frequency, pelvic pain, urgency, vaginal bleeding and vaginal discharge.       Vaginal pressure and irritation  Neurological: Negative.    Physical Exam   Blood pressure 119/72, pulse 72, temperature 98.4 F (36.9 C), temperature source Oral, resp. rate 18, height 5\' 2"  (1.575 m), weight 109.3 kg, last menstrual period 09/30/2017, SpO2 100 %.  Physical Exam  Nursing note and vitals reviewed. Constitutional: She is oriented to person, place, and time. She appears well-developed and well-nourished. No distress.  HENT:  Head: Normocephalic.  Cardiovascular: Normal rate, regular rhythm and normal heart sounds.  Respiratory: Effort normal and breath sounds normal. No respiratory distress. She has no wheezes.  GI: Soft. Bowel sounds are normal. She exhibits no distension. There is no tenderness.  Genitourinary: No tenderness or bleeding in the vagina. Vaginal discharge found.  Genitourinary Comments: Pelvic exam: white thin milky discharge with odor present   Musculoskeletal: Normal range of motion. She exhibits no edema.  Neurological: She is alert and oriented to person, place, and time.  Psychiatric: She has a normal mood and affect. Her behavior is normal. Thought content normal.    MAU Course  Procedures  MDM Wet prep GC/C  UA  Results for orders placed or performed during the hospital encounter of 07/19/18 (from the past 24 hour(s))  Wet prep, genital     Status: Abnormal   Collection Time: 07/20/18 12:37 AM  Result Value Ref Range    Yeast Wet Prep HPF POC NONE SEEN NONE SEEN   Trich, Wet Prep NONE SEEN NONE SEEN   Clue Cells Wet Prep HPF POC PRESENT (A) NONE SEEN   WBC, Wet Prep HPF POC FEW (A) NONE SEEN   Sperm NONE SEEN    GC/C pending  UA- negative  Wet prep- positive for clue cells   Discussed results of labs with patient. Educated on BV and treatment options for BV. Rx for Tinidazole sent to pharmacy of choice. Pt stable at time of discharge.   Assessment and Plan   1. BV (bacterial vaginosis)   2. Vaginal irritation  3. Vaginal discharge    Discharge home  Discussed reasons to return to MAU  Follow up as scheduled with PCP or with worsening symptoms  Rx for Tinidazole   Allergies as of 07/20/2018      Reactions   Ivp Dye [iodinated Diagnostic Agents] Anaphylaxis, Other (See Comments)   "Almost died"   Oxycodone    Penicillins Hives, Other (See Comments)   Has patient had a PCN reaction causing immediate rash, facial/tongue/throat swelling, SOB or lightheadedness with hypotension: No Has patient had a PCN reaction causing severe rash involving mucus membranes or skin necrosis: Yes Has patient had a PCN reaction that required hospitalization No Has patient had a PCN reaction occurring within the last 10 years: No If all of the above answers are "NO", then may proceed with Cephalosporin use.   Percocet [oxycodone-acetaminophen] Itching      Medication List    TAKE these medications   Albuterol Sulfate 108 (90 Base) MCG/ACT Aepb Inhale 2 puffs into the lungs every 6 (six) hours. As needed for wheezing or shortness of breath   amLODipine 5 MG tablet Commonly known as:  NORVASC Take 1 tablet (5 mg total) by mouth daily.   benazepril 20 MG tablet Commonly known as:  LOTENSIN Take 1 tablet (20 mg total) by mouth daily.   ibuprofen 800 MG tablet Commonly known as:  ADVIL,MOTRIN Take 1 tablet (800 mg total) by mouth every 8 (eight) hours as needed.   levothyroxine 112 MCG tablet Commonly known  as:  SYNTHROID, LEVOTHROID TAKE 1 TABLET BY MOUTH ONCE DAILY   tinidazole 500 MG tablet Commonly known as:  TINDAMAX Take 4 tablets (2,000 mg total) by mouth daily with breakfast. For two days   zolpidem 10 MG tablet Commonly known as:  AMBIEN Take 1 tablet (10 mg total) by mouth at bedtime as needed for sleep.      Lajean Manes CNM 07/20/2018, 12:52 AM

## 2018-07-21 LAB — GC/CHLAMYDIA PROBE AMP (~~LOC~~) NOT AT ARMC
Chlamydia: NEGATIVE
Neisseria Gonorrhea: NEGATIVE

## 2018-07-26 ENCOUNTER — Encounter: Payer: Self-pay | Admitting: Physician Assistant

## 2018-07-26 ENCOUNTER — Ambulatory Visit (INDEPENDENT_AMBULATORY_CARE_PROVIDER_SITE_OTHER): Payer: Medicare Other | Admitting: Physician Assistant

## 2018-07-26 VITALS — BP 118/76 | HR 67 | Temp 98.4°F | Resp 18 | Ht 62.0 in | Wt 240.4 lb

## 2018-07-26 DIAGNOSIS — N760 Acute vaginitis: Secondary | ICD-10-CM | POA: Diagnosis not present

## 2018-07-26 DIAGNOSIS — E079 Disorder of thyroid, unspecified: Secondary | ICD-10-CM

## 2018-07-26 DIAGNOSIS — I10 Essential (primary) hypertension: Secondary | ICD-10-CM

## 2018-07-26 DIAGNOSIS — G4700 Insomnia, unspecified: Secondary | ICD-10-CM

## 2018-07-26 LAB — WET PREP FOR TRICH, YEAST, CLUE

## 2018-07-26 NOTE — Progress Notes (Signed)
Patient ID: EUPHA LOBB MRN: [id number], DOB: 1976-10-18, 42 y.o. Date of Encounter: @DATE @  Chief Complaint:  Chief Complaint  Patient presents with  . vaginal irritation  . Discuss ambien    causing bad dreams     HPI: 42 y.o. year old female  presents with above.    07/06/2017:  She states that she thinks that her high blood pressure is causing her to have this mild/dull headache.  Says that when she went to her OB/GYN they noted the blood pressure was high.  She has not had it checked elsewhere/has not checked at home. She reports that she is taking the benazepril 10 mg daily and is compliant with this. She reports that she is also taking the thyroid medication daily and is also compliant with this.  Also reviewed that depression screening today did show significant depression score. Reviewed that we had discussed long history of depression/psych disorder at prior visit. Today I asked her if she ever had follow-up with psychiatry. I reviewed that she did have a behavioral health note by staff at her OB/GYN office on 02/11/17 and that note also stated to follow-up with a psychiatrist and a psychologist. Today I asked patient where things stood with that and whether she did have follow-up. Says that they had given her a number to contact for a psychiatrist but she then had a contact that person again to find somebody that will take Medicare. Says that she is still waiting to hear back from them to find someone who takes Medicare. Says that if I can provide her someone takes Medicare that would be helpful. Otherwise says that her depression symptoms are stable. She denies any suicidal or homicidal ideation.  AT THAT OV: --Increased benazepril to 20 mg daily.  --Planned F/U 2 weeks to recheck BP and bmet after increasing medication dose. --Checked TSH--was WNL--conted current dose -- I gave her phone number for [facility] [phone]. They accept Medicare. When patient  returns for follow-up visit in 2 weeks I will follow up to see if she has scheduled this appointment and encouraged her to follow up on this.   07/20/2017: Today she reports that she did increase the benazepril to 20 mg. She reports that she has not checked her blood pressure anywhere since last visit here.  Says that she has been out-of-town.  Later during the visit says that she was in [place].  Says she was visiting her fianc.  Says that it is a childhood sweetheart and "they recently found each other again".  Says that she was considering for her and her children to go move there but he thinks he may move here. Says that he recently retired from [employer]. At the end of the visit she even brings him to meet me--- she had told me that he was out in the lobby. Asked her if she followed up with the psychiatrist office. She says that this past Friday she called there and left them a message but has not heard back yet. If she does not hear from them in the next several days I have told her to call them back.  --------A/P AT THAT OV:----------------- 1. Essential hypertension 07/20/2017: I rechecked blood pressure myself and I'm getting even higher than nurse blood pressure check. I'm getting right around 150/104.                   Will have her continue the benazepril 20 mg daily.  Will add Norvasc 5 mg daily.        Today will check bmet since increasing the benazepril dose at last visit.        Have her return for follow-up in 2 weeks to recheck blood pressure after addition of Norvasc.       - amLODipine (NORVASC) 5 MG tablet; Take 1 tablet (5 mg total) by mouth daily.  Dispense: 30 tablet; Refill: 0        - BASIC METABOLIC PANEL WITH GFR  2. Thyroid disease 07/20/2017: TSH was normal at recent lab. Continue current dose.           - levothyroxine (SYNTHROID, LEVOTHROID) 112 MCG tablet; TAKE 1 TABLET BY MOUTH ONCE DAILY  Dispense: 90 tablet; Refill: 1  3.Depression, PTSD 06/2017:  Today I gave her phone number for [facility] [phone]. They accept Medicare. When patient returns for follow-up visit in 2 weeks I will follow up to see if she has scheduled this appointment and encouraged her to follow up on this. 07/20/2017: She reports that she did call there and left a message. Today told her that if she does not hear anything from them within 1 week to call them back again.    08/03/2017: Today she reports that she did add the Norvasc 5 mg daily and has been taking this every day as directed. Is having no lower extremity edema or other adverse effects. Continues to take the benazepril daily as well. Her boyfriend is with her during visit today-- the one who recently came from [place]. Patient is in good spirits and seems very happy. Asked if she had gotten the appointment with psychiatry she says that she actually lost that number so I have written down that phone number on her AVS for her again today and she is to call to schedule that appointment. She has no other concerns to address today.   05/22/2018:  She had OV--see that note for details--that visit addressed Resp Infection.   07/03/2018: She reports that she is taking both the benazepril and amlodipine as directed for her blood pressure.  These are causing no lightheadedness or other adverse effects. Reports that she is also taking her thyroid medication each morning daily. Today I asked about her boyfriend.  She states that "I have gotten rid of him".  "He is back in [place]" She does not seem upset or sad about this.  She has a smile throughout visit today. She reports that she is having significant insomnia. Discussed that sometimes medications prescribed by psychiatrist will affect sleep so I asked about the when she last saw psych.  States that she last saw psych about a year ago but states that that was a therapist.  Says that she was not seeing anyone that prescribes medicine.  States that the only time  she was of any medications for psych issues was when she was hospitalized at behavioral health.  Otherwise has only seen therapist and has not required psych meds.  She states that she currently feels like her mood is stable during the day other than just being tired because she is not getting sleep.  Otherwise is not feeling like she is having any mood changes or anxiety or depression. I then asked about her usual schedule / sleep routine.  States that she gets off work at 10:30 PM.  Says that by the time she gets home and settle down is about 1130 or 12 midnight.  However she remains awake  until around 6 or 7 AM.  Cannot fall asleep until then.  Asked what she is doing from midnight until 7 AM.  Says that she either looks at things on her phone while she is trying to fall asleep or watches TV.  Says that sometimes she does close her eyes, her mind goes back to things from her childhood-- sometimes does not want her close her eyes because of that.  Eventually, will follow sleep around 7 AM.  Usually sleeps until around 10 AM or so will try to fall back to sleep but usually cannot.  Goes to work at [employer].  Works as a Engineer, materials. Asked if she had ever been on any sleep medicines before.  She states that the only medicine she was on was amitriptyline/Elavil but states that that did not work and that she would take more than 1 of them and still could not get any sleep.  She also reports that she has been feeling some discomfort between her shoulder blades and upper neck.  No other concerns today.     07/26/2018: Today 1 of the concerns she presents with is vaginal irritation. Reviewed chart.  Reviewed visit to [facility] clinic 07/19/2018.  Wet prep was positive for clue cells and she was treated for bacterial vaginosis. Today she reports that she did complete the treatment that was prescribed to her at that visit.  However she states that the vaginal irritation never really improved.  Reports that vaginal  irritation has persisted since then.  Had no pelvic pain.  No fevers or chills.  Just the vaginal irritation.  She continues to take BP meds as directed.  These are causing no adverse effects. She continues to take thyroid medication every morning as directed. She reports that last visit I prescribed Ambien.  Reports that the Ambien caused her to have bad dreams. Today when nurse roomed patient depression screen was performed and she answered 3 to every single question.  Discussed this further with her.  She reports she has no suicidal or homicidal ideation.  She reports that she does have follow-up scheduled with psych this Tuesday at 11:00.  Have informed her to make sure she keeps that appointment and to make sure to inform them of her insomnia as I feel that her insomnia is related to the depression and other psych issues.  Well treatment for insomnia would be related to the treatment of these other psych issues.  She voices understanding and agrees.  No other specific concerns to address today.    Past Medical History:  Diagnosis Date  . Anxiety    no meds  . Asthma    rarely uses inhaler - seasonal with allergies  . Depression    no med  . Hypertension   . Hypothyroidism   . Obesity   . Seasonal allergies   . SVD (spontaneous vaginal delivery) 1997   x   . Thyroid disease      Home Meds: Outpatient Medications Prior to Visit  Medication Sig Dispense Refill  . Albuterol Sulfate (PROAIR RESPICLICK) 675 (90 Base) MCG/ACT AEPB Inhale 2 puffs into the lungs every 6 (six) hours. As needed for wheezing or shortness of breath 1 each 2  . amLODipine (NORVASC) 5 MG tablet Take 1 tablet (5 mg total) by mouth daily. 90 tablet 2  . benazepril (LOTENSIN) 20 MG tablet Take 1 tablet (20 mg total) by mouth daily. 90 tablet 1  . ibuprofen (ADVIL,MOTRIN) 800 MG tablet Take  1 tablet (800 mg total) by mouth every 8 (eight) hours as needed. 30 tablet 0  . levothyroxine (SYNTHROID, LEVOTHROID) 112  MCG tablet TAKE 1 TABLET BY MOUTH ONCE DAILY 90 tablet 1  . tinidazole (TINDAMAX) 500 MG tablet Take 4 tablets (2,000 mg total) by mouth daily with breakfast. For two days 8 tablet 0  . zolpidem (AMBIEN) 10 MG tablet Take 1 tablet (10 mg total) by mouth at bedtime as needed for sleep. 30 tablet 0   No facility-administered medications prior to visit.     Allergies:  Allergies  Allergen Reactions  . Ivp Dye [Iodinated Diagnostic Agents] Anaphylaxis and Other (See Comments)    "Almost died"  . Oxycodone   . Penicillins Hives and Other (See Comments)    Has patient had a PCN reaction causing immediate rash, facial/tongue/throat swelling, SOB or lightheadedness with hypotension: No Has patient had a PCN reaction causing severe rash involving mucus membranes or skin necrosis: Yes Has patient had a PCN reaction that required hospitalization No Has patient had a PCN reaction occurring within the last 10 years: No If all of the above answers are "NO", then may proceed with Cephalosporin use.   Marland Kitchen Percocet [Oxycodone-Acetaminophen] Itching    Social History   Socioeconomic History  . Marital status: Single    Spouse name: Not on file  . Number of children: Not on file  . Years of education: Not on file  . Highest education level: Not on file  Occupational History  . Not on file  Social Needs  . Financial resource strain: Not on file  . Food insecurity:    Worry: Not on file    Inability: Not on file  . Transportation needs:    Medical: Not on file    Non-medical: Not on file  Tobacco Use  . Smoking status: Never Smoker  . Smokeless tobacco: Never Used  Substance and Sexual Activity  . Alcohol use: No  . Drug use: No  . Sexual activity: Not Currently    Birth control/protection: None  Lifestyle  . Physical activity:    Days per week: Not on file    Minutes per session: Not on file  . Stress: Not on file  Relationships  . Social connections:    Talks on phone: Not on file      Gets together: Not on file    Attends religious service: Not on file    Active member of club or organization: Not on file    Attends meetings of clubs or organizations: Not on file    Relationship status: Not on file  . Intimate partner violence:    Fear of current or ex partner: Not on file    Emotionally abused: Not on file    Physically abused: Not on file    Forced sexual activity: Not on file  Other Topics Concern  . Not on file  Social History Narrative  . Not on file    Family History  Problem Relation Age of Onset  . Cancer Mother 72       Breast  . Depression Mother   . Hearing loss Mother   . Hypertension Mother   . Stroke Mother   . Asthma Brother   . Birth defects Sister      Review of Systems:  See HPI for pertinent ROS. All other ROS negative.    Physical Exam: Blood pressure 118/76, pulse 67, temperature 98.4 F (36.9 C), temperature source Oral, resp. rate  18, height 5\' 2"  (1.575 m), weight 109 kg, last menstrual period 09/30/2017, SpO2 98 %., Body mass index is 43.97 kg/m. General: Obese AAF. Appears in no acute distress. Neck: Supple. No thyromegaly. No lymphadenopathy. Lungs: Clear bilaterally to auscultation without wheezes, rales, or rhonchi. Breathing is unlabored. Heart: RRR with S1 S2. No murmurs, rubs, or gallops. Abdomen: Soft, non-tender, non-distended with normoactive bowel sounds. No hepatomegaly. No rebound/guarding. No obvious abdominal masses. Pelvic Exam: External genitalia normal.  Vaginal mucosa normal.  Cervix normal.  There is no discharge present.  No edema or erythema of the vaginal area.  Bimanual exam is normal with no cervical motion tenderness or adnexal mass. Musculoskeletal:  Strength and tone normal for age. Extremities/Skin: Warm and dry. Neuro: Alert and oriented X 3. Moves all extremities spontaneously. Gait is normal. CNII-XII grossly in tact. Psych:  Responds to questions appropriately with a normal affect.        ASSESSMENT AND PLAN:  42 y.o. year old female with   1. Essential hypertension 07/20/2017: I rechecked blood pressure myself and I'm getting even higher than nurse blood pressure check. I'm getting right around 150/104.                   Will have her continue the benazepril 20 mg daily.        Will add Norvasc 5 mg daily.        Today will check bmet since increasing the benazepril dose at last visit.        Have her return for follow-up in 2 weeks to recheck blood pressure after addition of Norvasc.       - amLODipine (NORVASC) 5 MG tablet; Take 1 tablet (5 mg total) by mouth daily.  Dispense: 30 tablet; Refill: 0        - BASIC METABOLIC PANEL WITH GFR 06/17/1750: Blood pressure is now controlled. She will continue current medications without any additional change. Astepro 20 mg daily. Continue Norvasc 5 mg daily. 07/03/2018: Blood Pressure is controlled.  Continue current medications.  Benazepril 20 mg daily.  Norvasc 5 mg daily.  Check lab to monitor. 07/26/2018: Blood Pressure is at goal/controlled.  Lab was normal 07/03/2018.  Continue current BP meds.   2. Thyroid disease 07/20/2017: TSH was normal at recent lab. Continue current dose.           - levothyroxine (SYNTHROID, LEVOTHROID) 112 MCG tablet; TAKE 1 TABLET BY MOUTH ONCE DAILY  Dispense: 90 tablet; Refill: 1 07/03/2018: She reports she is taking her levothyroxine daily.  Check TSH to monitor. 07/26/2018:  TSH was within normal range at lab 07/03/2018.  Continue current dose thyroid medication.   3. Insomnia, unspecified type 07/03/2018: I am somewhat concerned that her insomnia may be related to her psych history.  However she is stating that she feels her mood is stable.  Does not report feeling mania as part of bipolar as cause of her insomnia.  Encouraged follow-up with her's therapist.  Also I will try Ambien but have told her that if this causes any adverse effects or if it is not effective to call me for follow-up  immediately.  She voices understanding and agrees.  Discussed for her to go ahead and take this as soon as she gets home from work.  Discussed to take this when she has full 8 hours that she can sleep. - zolpidem (AMBIEN) 10 MG tablet; Take 1 tablet (10 mg total) by mouth at bedtime as needed for  sleep.  Dispense: 30 tablet; Refill: 0 07/26/2018: Discussed for her to make sure she does follow up and keeps her appointment at Psych on Tuesday at 11:00.  Also told her to make sure to discuss her insomnia at that visit.  4. Depression, PTSD 06/2017: Today I gave her phone number for [facility] [phone]. They accept Medicare. When patient returns for follow-up visit in 2 weeks I will follow up to see if she has scheduled this appointment and encouraged her to follow up on this. 07/20/2017: She reports that she did call there and left a message. Today told her that if she does not hear anything from them within 1 week to call them back again. 08/03/17: She reports that she actually lost that phone number and has not followed up with getting that appointment. Day I have written down the phone number and contact information for her again. ----------------Today I gave her phone number for [facility] [phone]. They accept Medicare. 07/03/2018: See HPI from today and #3 above from today. 07/26/2018: Discussed with her to make sure that she keeps her follow-up appointment at psych on Tuesday at 11:00.  She denies homicidal or suicidal ideation or plans.  Her mood and affect seem appropriate through visit and she seems stable to wait for psych follow-up on Tuesday.    5. Vaginitis and vulvovaginitis Discussed with patient physical exam findings are completely normal.  Also wet prep is normal.  Will follow up gonorrhea chlamydia test but otherwise reassured her that things seem normal regarding this. - WET PREP FOR TRICH, YEAST, CLUE - C. trachomatis/N. gonorrhoeae RNA    Signed,    [address] [place], [place], [facility] 07/26/2018 11:23 AM

## 2018-07-27 LAB — C. TRACHOMATIS/N. GONORRHOEAE RNA
C. trachomatis RNA, TMA: NOT DETECTED
N. gonorrhoeae RNA, TMA: NOT DETECTED

## 2018-09-11 ENCOUNTER — Ambulatory Visit: Payer: Medicare Other | Admitting: Podiatry

## 2018-09-25 ENCOUNTER — Encounter: Payer: Self-pay | Admitting: Podiatry

## 2018-09-25 ENCOUNTER — Ambulatory Visit (INDEPENDENT_AMBULATORY_CARE_PROVIDER_SITE_OTHER): Payer: Medicare Other | Admitting: Podiatry

## 2018-09-25 VITALS — BP 133/89 | HR 70

## 2018-09-25 DIAGNOSIS — B353 Tinea pedis: Secondary | ICD-10-CM

## 2018-09-25 MED ORDER — CLOTRIMAZOLE-BETAMETHASONE 1-0.05 % EX CREA: 1.0000 "application " | TOPICAL_CREAM | Freq: Two times a day (BID) | CUTANEOUS | 1 refills | Status: DC

## 2018-09-25 MED ORDER — TERBINAFINE HCL 250 MG PO TABS: 250.0000 mg | ORAL_TABLET | Freq: Every day | ORAL | 0 refills | Status: DC

## 2018-09-27 NOTE — Progress Notes (Signed)
   HPI: 42 year old female presenting today as a new patient, referred by Dr. Gershon Mussel, with a chief complaint of dry, irritated skin to the plantar aspects of bilateral feet that has been present intermittently for the past several years. She reports severe associated itching and sometimes scratches the skin off. She has tried several different OTC treatment with no significant relief. She denies modifying factors. Patient is here for further evaluation and treatment.   Past Medical History:  Diagnosis Date  . Anxiety    no meds  . Asthma    rarely uses inhaler - seasonal with allergies  . Depression    no med  . Hypertension   . Hypothyroidism   . Obesity   . Seasonal allergies   . SVD (spontaneous vaginal delivery) 1997   x   . Thyroid disease      Physical Exam: General: The patient is alert and oriented x3 in no acute distress.  Dermatology: Pruritus with hyperkeratosis noted to the right foot. Skin is warm, dry and supple bilateral lower extremities. Negative for open lesions or macerations.  Vascular: Palpable pedal pulses bilaterally. No edema or erythema noted. Capillary refill within normal limits.  Neurological: Epicritic and protective threshold grossly intact bilaterally.   Musculoskeletal Exam: Range of motion within normal limits to all pedal and ankle joints bilateral. Muscle strength 5/5 in all groups bilateral.   Assessment: 1. Tinea pedis right    Plan of Care:  1. Patient evaluated.  2. Prescription for Lamisil 250 mg #28 provided to patient.  3. Prescription for Lotrisone cream provided to patient.  4. Return to clinic in 4 weeks.      Edrick Kins, DPM [facility]  Dr. Edrick Kins, DPM    [address], [place] [address]                Office [phone]  Fax [phone]

## 2018-10-23 ENCOUNTER — Ambulatory Visit: Payer: Medicare Other | Admitting: Podiatry

## 2018-10-26 ENCOUNTER — Ambulatory Visit: Payer: Medicare Other | Admitting: Family Medicine

## 2018-11-06 ENCOUNTER — Encounter: Payer: Self-pay | Admitting: Podiatry

## 2018-11-06 ENCOUNTER — Encounter

## 2018-11-06 ENCOUNTER — Ambulatory Visit (INDEPENDENT_AMBULATORY_CARE_PROVIDER_SITE_OTHER): Payer: Medicare Other | Admitting: Podiatry

## 2018-11-06 DIAGNOSIS — B353 Tinea pedis: Secondary | ICD-10-CM

## 2018-11-06 MED ORDER — TERBINAFINE HCL 250 MG PO TABS: 250.0000 mg | ORAL_TABLET | Freq: Every day | ORAL | 0 refills | Status: DC

## 2018-11-06 MED ORDER — CLOTRIMAZOLE-BETAMETHASONE 1-0.05 % EX CREA: 1.0000 "application " | TOPICAL_CREAM | Freq: Two times a day (BID) | CUTANEOUS | 1 refills | Status: DC

## 2018-11-07 ENCOUNTER — Ambulatory Visit (INDEPENDENT_AMBULATORY_CARE_PROVIDER_SITE_OTHER): Payer: Medicare Other | Admitting: Family Medicine

## 2018-11-07 ENCOUNTER — Encounter: Payer: Self-pay | Admitting: Family Medicine

## 2018-11-07 VITALS — BP 100/70 | HR 70 | Temp 97.9°F | Resp 16 | Wt 248.0 lb

## 2018-11-07 DIAGNOSIS — J019 Acute sinusitis, unspecified: Secondary | ICD-10-CM | POA: Diagnosis not present

## 2018-11-07 DIAGNOSIS — B9689 Other specified bacterial agents as the cause of diseases classified elsewhere: Secondary | ICD-10-CM

## 2018-11-07 DIAGNOSIS — J040 Acute laryngitis: Secondary | ICD-10-CM

## 2018-11-07 MED ORDER — AZITHROMYCIN 250 MG PO TABS: ORAL_TABLET | ORAL | 0 refills | Status: DC

## 2018-11-07 MED ORDER — PREDNISONE 20 MG PO TABS: ORAL_TABLET | ORAL | 0 refills | Status: DC

## 2018-11-07 NOTE — Progress Notes (Signed)
Subjective:    Patient ID: Janice Roberts, female    DOB: [birth year], 42 y.o.   MRN: [id number]  HPI Patient presents today complaining of laryngitis.  Patient states that she has been hoarse since December 1.  Today she is barely able to talk.  She also reports drainage and sinus pain in her frontal sinuses bilaterally.  She reports a constant sinus headache.  She has tenderness to palpation and percussion over the frontal sinuses.  On examination today she has +2 swollen tonsils but without erythema.  There is tender lymphadenopathy in the anterior cervical spine.  She denies any reflux Past Medical History:  Diagnosis Date  . Anxiety    no meds  . Asthma    rarely uses inhaler - seasonal with allergies  . Depression    no med  . Hypertension   . Hypothyroidism   . Obesity   . Seasonal allergies   . SVD (spontaneous vaginal delivery) 1997   x   . Thyroid disease    Past Surgical History:  Procedure Laterality Date  . ABDOMINAL HYSTERECTOMY    . APPENDECTOMY    . [place]   x   . [place] OF UTERUS  08/08/2008 & 03/08/2013   2009 missed ab, 2014 bleeding  . LAPAROSCOPIC VAGINAL HYSTERECTOMY WITH SALPINGO OOPHORECTOMY Bilateral 12/06/2017   Procedure: LAPAROSCOPIC ASSISTED VAGINAL HYSTERECTOMY WITH SALPINGO OOPHORECTOMY;  Surgeon: Woodroe Mode, MD;  Location: [facility];  Service: Gynecology;  Laterality: Bilateral;  . LAPAROSCOPY N/A 01/24/2017   Procedure: LAPAROSCOPY DIAGNOSTIC;  Surgeon: Woodroe Mode, MD;  Location: [facility];  Service: Gynecology;  Laterality: N/A;  . SHOULDER SURGERY Left   . THYROIDECTOMY    . WISDOM TOOTH EXTRACTION     Current Outpatient Medications on File Prior to Visit  Medication Sig Dispense Refill  . Albuterol Sulfate (PROAIR RESPICLICK) 789 (90 Base) MCG/ACT AEPB Inhale 2 puffs into the lungs every 6 (six) hours. As needed for wheezing or shortness of breath 1 each 2  . amLODipine (NORVASC) 5 MG tablet Take 1 tablet (5  mg total) by mouth daily. 90 tablet 2  . benazepril (LOTENSIN) 20 MG tablet Take 1 tablet (20 mg total) by mouth daily. 90 tablet 1  . clotrimazole-betamethasone (LOTRISONE) cream Apply 1 application topically 2 (two) times daily. 30 g 1  . ibuprofen (ADVIL,MOTRIN) 800 MG tablet Take 1 tablet (800 mg total) by mouth every 8 (eight) hours as needed. 30 tablet 0  . levothyroxine (SYNTHROID, LEVOTHROID) 112 MCG tablet TAKE 1 TABLET BY MOUTH ONCE DAILY 90 tablet 1  . terbinafine (LAMISIL) 250 MG tablet Take 1 tablet (250 mg total) by mouth daily. 28 tablet 0  . tinidazole (TINDAMAX) 500 MG tablet Take 4 tablets (2,000 mg total) by mouth daily with breakfast. For two days 8 tablet 0  . zolpidem (AMBIEN) 10 MG tablet Take 1 tablet (10 mg total) by mouth at bedtime as needed for sleep. 30 tablet 0   No current facility-administered medications on file prior to visit.    Allergies  Allergen Reactions  . Ivp Dye [Iodinated Diagnostic Agents] Anaphylaxis and Other (See Comments)    "Almost died"  . Oxycodone   . Penicillins Hives and Other (See Comments)    Has patient had a PCN reaction causing immediate rash, facial/tongue/throat swelling, SOB or lightheadedness with hypotension: No Has patient had a PCN reaction causing severe rash involving mucus membranes or skin necrosis: Yes Has patient  had a PCN reaction that required hospitalization No Has patient had a PCN reaction occurring within the last 10 years: No If all of the above answers are "NO", then may proceed with Cephalosporin use.   Marland Kitchen Percocet [Oxycodone-Acetaminophen] Itching   Social History   Socioeconomic History  . Marital status: Single    Spouse name: Not on file  . Number of children: Not on file  . Years of education: Not on file  . Highest education level: Not on file  Occupational History  . Not on file  Social Needs  . Financial resource strain: Not on file  . Food insecurity:    Worry: Not on file    Inability: Not  on file  . Transportation needs:    Medical: Not on file    Non-medical: Not on file  Tobacco Use  . Smoking status: Never Smoker  . Smokeless tobacco: Never Used  Substance and Sexual Activity  . Alcohol use: No  . Drug use: No  . Sexual activity: Not Currently    Birth control/protection: None  Lifestyle  . Physical activity:    Days per week: Not on file    Minutes per session: Not on file  . Stress: Not on file  Relationships  . Social connections:    Talks on phone: Not on file    Gets together: Not on file    Attends religious service: Not on file    Active member of club or organization: Not on file    Attends meetings of clubs or organizations: Not on file    Relationship status: Not on file  . Intimate partner violence:    Fear of current or ex partner: Not on file    Emotionally abused: Not on file    Physically abused: Not on file    Forced sexual activity: Not on file  Other Topics Concern  . Not on file  Social History Narrative  . Not on file     Review of Systems  All other systems reviewed and are negative.      Objective:   Physical Exam Vitals signs reviewed.  Constitutional:      General: She is not in acute distress.    Appearance: Normal appearance. She is not ill-appearing or toxic-appearing.  HENT:     Right Ear: Tympanic membrane and ear canal normal.     Left Ear: Tympanic membrane and ear canal normal.     Nose: No congestion or rhinorrhea.     Right Turbinates: Not enlarged or swollen.     Left Turbinates: Not enlarged or swollen.     Right Sinus: Frontal sinus tenderness present. No maxillary sinus tenderness.     Left Sinus: Frontal sinus tenderness present. No maxillary sinus tenderness.     Mouth/Throat:     Mouth: Mucous membranes are moist.     Pharynx: Oropharynx is clear. No oropharyngeal exudate or posterior oropharyngeal erythema.  Eyes:     Conjunctiva/sclera: Conjunctivae normal.  Neck:     Musculoskeletal: Normal  range of motion and neck supple. Normal range of motion. Muscular tenderness present. No neck rigidity.  Cardiovascular:     Rate and Rhythm: Normal rate and regular rhythm.     Heart sounds: Normal heart sounds. No murmur.  Pulmonary:     Effort: Pulmonary effort is normal. No respiratory distress.     Breath sounds: No wheezing, rhonchi or rales.  Lymphadenopathy:     Cervical: Cervical adenopathy present.  Right cervical: Superficial cervical adenopathy present.     Left cervical: Superficial cervical adenopathy present.  Neurological:     Mental Status: She is alert.           Assessment & Plan:  I believe the patient had a viral upper respiratory infection/laryngitis that is progressed to a subsequent secondary bacterial sinus infection with persistent drainage causing almost chronic laryngitis.  Recommended a combination of a Z-Pak given her allergy to penicillin coupled with a prednisone taper pack and then reassess in 1 week for improvement.  Recheck sooner if worsening

## 2018-11-09 NOTE — Progress Notes (Signed)
   HPI: 43 year old female presenting today for follow up evaluation of tinea pedis of the right foot. She states her symptoms have improved significantly and denies any further complaints. She has been taking the Lamisil and using the Lotrisone as directed. Patient is here for further evaluation and treatment.   Past Medical History:  Diagnosis Date  . Anxiety    no meds  . Asthma    rarely uses inhaler - seasonal with allergies  . Depression    no med  . Hypertension   . Hypothyroidism   . Obesity   . Seasonal allergies   . SVD (spontaneous vaginal delivery) 1997   x   . Thyroid disease      Objective: Physical Exam General: The patient is alert and oriented x3 in no acute distress.  Dermatology: Skin is cool, dry and supple bilateral lower extremities. Negative for open lesions or macerations.  Vascular: Palpable pedal pulses bilaterally. No edema or erythema noted. Capillary refill within normal limits.  Neurological: Epicritic and protective threshold grossly intact bilaterally.   Musculoskeletal Exam: All pedal and ankle joints range of motion within normal limits bilateral. Muscle strength 5/5 in all groups bilateral.   Assessment: 1. Tinea pedis right - resolved    Plan of Care:  1. Patient evaluated.  2. Refill prescription for Lamisil 250 mg #28 provided to patient.  3. Refill prescription for Lotrisone cream provided to patient.  4. Recommended good shoe gear.  5. Return to clinic as needed.      Edrick Kins, DPM [facility]  Dr. Edrick Kins, DPM    [address], [place] [address]                Office [phone]  Fax [phone]

## 2018-11-20 ENCOUNTER — Ambulatory Visit (INDEPENDENT_AMBULATORY_CARE_PROVIDER_SITE_OTHER): Payer: Medicare Other | Admitting: Family Medicine

## 2018-11-20 ENCOUNTER — Encounter: Payer: Self-pay | Admitting: Family Medicine

## 2018-11-20 VITALS — BP 128/90 | HR 70 | Temp 98.8°F | Resp 14 | Ht 62.0 in | Wt 253.0 lb

## 2018-11-20 DIAGNOSIS — R490 Dysphonia: Secondary | ICD-10-CM | POA: Diagnosis not present

## 2018-11-20 MED ORDER — PANTOPRAZOLE SODIUM 40 MG PO TBEC: 40.0000 mg | DELAYED_RELEASE_TABLET | Freq: Every day | ORAL | 3 refills | Status: DC

## 2018-11-20 NOTE — Progress Notes (Signed)
Subjective:    Patient ID: Janice Roberts, female    DOB: [birth year], 43 y.o.   MRN: [id number]  HPI  11/07/18 Patient presents today complaining of laryngitis.  Patient states that she has been hoarse since December 1.  Today she is barely able to talk.  She also reports drainage and sinus pain in her frontal sinuses bilaterally.  She reports a constant sinus headache.  She has tenderness to palpation and percussion over the frontal sinuses.  On examination today she has +2 swollen tonsils but without erythema.  There is tender lymphadenopathy in the anterior cervical spine.  She denies any reflux.  At that time, my plan was: I believe the patient had a viral upper respiratory infection/laryngitis that is progressed to a subsequent secondary bacterial sinus infection with persistent drainage causing almost chronic laryngitis.  Recommended a combination of a Z-Pak given her allergy to penicillin coupled with a prednisone taper pack and then reassess in 1 week for improvement.  Recheck sooner if worsening  11/20/18 Patient continues to have a hoarse, shrill, squeaky voice.  She is completed the antibiotics and steroids with no improvement.  She denies any sinus pain.  She denies any sinus pressure.  She denies any fevers or chills.  She denies any cough.  She denies any rhinorrhea.  She does have some sore throat in her upper larynx area with palpation although I do not palpate any abnormalities.  When I asked her if she is having any acid reflux, the patient states she does feel acid coming from her stomach at times that she wonders may be burning her.  She denies any hemoptysis or hematemesis Past Medical History:  Diagnosis Date  . Anxiety    no meds  . Asthma    rarely uses inhaler - seasonal with allergies  . Depression    no med  . Hypertension   . Hypothyroidism   . Obesity   . Seasonal allergies   . SVD (spontaneous vaginal delivery) 1997   x   . Thyroid disease    Past Surgical  History:  Procedure Laterality Date  . ABDOMINAL HYSTERECTOMY    . APPENDECTOMY    . [place]   x   . [place] OF UTERUS  08/08/2008 & 03/08/2013   2009 missed ab, 2014 bleeding  . LAPAROSCOPIC VAGINAL HYSTERECTOMY WITH SALPINGO OOPHORECTOMY Bilateral 12/06/2017   Procedure: LAPAROSCOPIC ASSISTED VAGINAL HYSTERECTOMY WITH SALPINGO OOPHORECTOMY;  Surgeon: Woodroe Mode, MD;  Location: [facility];  Service: Gynecology;  Laterality: Bilateral;  . LAPAROSCOPY N/A 01/24/2017   Procedure: LAPAROSCOPY DIAGNOSTIC;  Surgeon: Woodroe Mode, MD;  Location: [facility];  Service: Gynecology;  Laterality: N/A;  . SHOULDER SURGERY Left   . THYROIDECTOMY    . WISDOM TOOTH EXTRACTION     Current Outpatient Medications on File Prior to Visit  Medication Sig Dispense Refill  . Albuterol Sulfate (PROAIR RESPICLICK) 741 (90 Base) MCG/ACT AEPB Inhale 2 puffs into the lungs every 6 (six) hours. As needed for wheezing or shortness of breath 1 each 2  . amLODipine (NORVASC) 5 MG tablet Take 1 tablet (5 mg total) by mouth daily. 90 tablet 2  . azithromycin (ZITHROMAX) 250 MG tablet 2 tabs poqday1, 1 tab poqday 2-5 6 tablet 0  . benazepril (LOTENSIN) 20 MG tablet Take 1 tablet (20 mg total) by mouth daily. 90 tablet 1  . clotrimazole-betamethasone (LOTRISONE) cream Apply 1 application topically 2 (two) times daily. 30 g  1  . ibuprofen (ADVIL,MOTRIN) 800 MG tablet Take 1 tablet (800 mg total) by mouth every 8 (eight) hours as needed. 30 tablet 0  . levothyroxine (SYNTHROID, LEVOTHROID) 112 MCG tablet TAKE 1 TABLET BY MOUTH ONCE DAILY 90 tablet 1  . predniSONE (DELTASONE) 20 MG tablet 3 tabs poqday 1-2, 2 tabs poqday 3-4, 1 tab poqday 5-6 12 tablet 0  . tinidazole (TINDAMAX) 500 MG tablet Take 4 tablets (2,000 mg total) by mouth daily with breakfast. For two days 8 tablet 0  . zolpidem (AMBIEN) 10 MG tablet Take 1 tablet (10 mg total) by mouth at bedtime as needed for sleep. 30 tablet 0   No  current facility-administered medications on file prior to visit.    Allergies  Allergen Reactions  . Ivp Dye [Iodinated Diagnostic Agents] Anaphylaxis and Other (See Comments)    "Almost died"  . Oxycodone   . Penicillins Hives and Other (See Comments)    Has patient had a PCN reaction causing immediate rash, facial/tongue/throat swelling, SOB or lightheadedness with hypotension: No Has patient had a PCN reaction causing severe rash involving mucus membranes or skin necrosis: Yes Has patient had a PCN reaction that required hospitalization No Has patient had a PCN reaction occurring within the last 10 years: No If all of the above answers are "NO", then may proceed with Cephalosporin use.   Marland Kitchen Percocet [Oxycodone-Acetaminophen] Itching   Social History   Socioeconomic History  . Marital status: Single    Spouse name: Not on file  . Number of children: Not on file  . Years of education: Not on file  . Highest education level: Not on file  Occupational History  . Not on file  Social Needs  . Financial resource strain: Not on file  . Food insecurity:    Worry: Not on file    Inability: Not on file  . Transportation needs:    Medical: Not on file    Non-medical: Not on file  Tobacco Use  . Smoking status: Never Smoker  . Smokeless tobacco: Never Used  Substance and Sexual Activity  . Alcohol use: No  . Drug use: No  . Sexual activity: Not Currently    Birth control/protection: None  Lifestyle  . Physical activity:    Days per week: Not on file    Minutes per session: Not on file  . Stress: Not on file  Relationships  . Social connections:    Talks on phone: Not on file    Gets together: Not on file    Attends religious service: Not on file    Active member of club or organization: Not on file    Attends meetings of clubs or organizations: Not on file    Relationship status: Not on file  . Intimate partner violence:    Fear of current or ex partner: Not on file     Emotionally abused: Not on file    Physically abused: Not on file    Forced sexual activity: Not on file  Other Topics Concern  . Not on file  Social History Narrative  . Not on file     Review of Systems  All other systems reviewed and are negative.      Objective:   Physical Exam Vitals signs reviewed.  Constitutional:      General: She is not in acute distress.    Appearance: Normal appearance. She is not ill-appearing or toxic-appearing.  HENT:     Right Ear: Tympanic  membrane and ear canal normal.     Left Ear: Tympanic membrane and ear canal normal.     Nose: No congestion or rhinorrhea.     Right Turbinates: Not enlarged or swollen.     Left Turbinates: Not enlarged or swollen.     Right Sinus: No maxillary sinus tenderness or frontal sinus tenderness.     Left Sinus: No maxillary sinus tenderness or frontal sinus tenderness.     Mouth/Throat:     Mouth: Mucous membranes are moist.     Pharynx: Oropharynx is clear. No oropharyngeal exudate or posterior oropharyngeal erythema.  Eyes:     Conjunctiva/sclera: Conjunctivae normal.  Neck:     Musculoskeletal: Normal range of motion and neck supple. Normal range of motion. Muscular tenderness present. No neck rigidity.  Cardiovascular:     Rate and Rhythm: Normal rate and regular rhythm.     Heart sounds: Normal heart sounds. No murmur.  Pulmonary:     Effort: Pulmonary effort is normal. No respiratory distress.     Breath sounds: No wheezing, rhonchi or rales.  Lymphadenopathy:     Cervical: No cervical adenopathy.  Neurological:     Mental Status: She is alert.           Assessment & Plan:  Chronic laryngitis  Has gone on now for 6 weeks.  Recommended a trial of Protonix 40 mg a day for possible laryngo-esophageal reflux.  I can see or palpate no other abnormalities that may explain her hoarseness.  Therefore I would recommend an ENT consultation for laryngoscopy if no better after the proton pump  inhibitor.

## 2018-11-21 IMAGING — CR DG CHEST 2V
2 series · 2 of 2 positions shown · non-contrast
Comparison: 08/13/2012

CLINICAL DATA: Chest pain and shortness of breath for 1 day.

EXAM:
CHEST  2 VIEW

[chest lat]
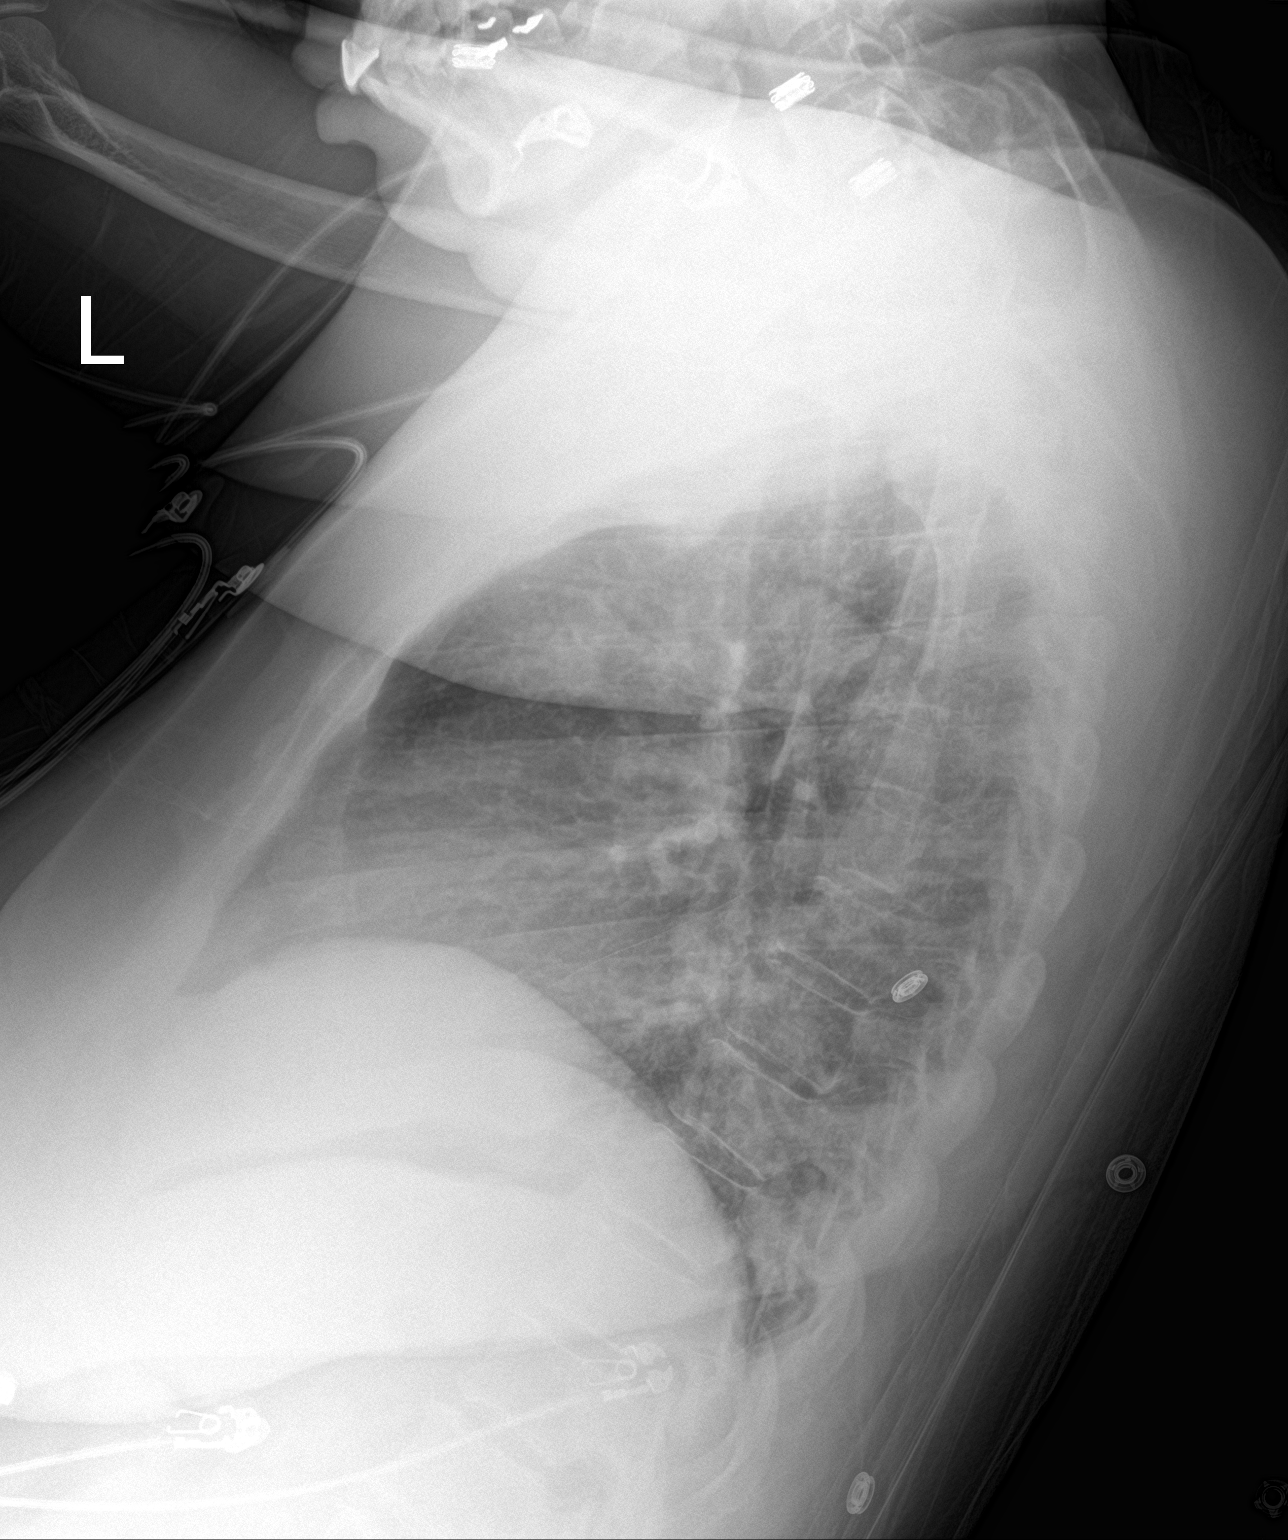

[chest ap]
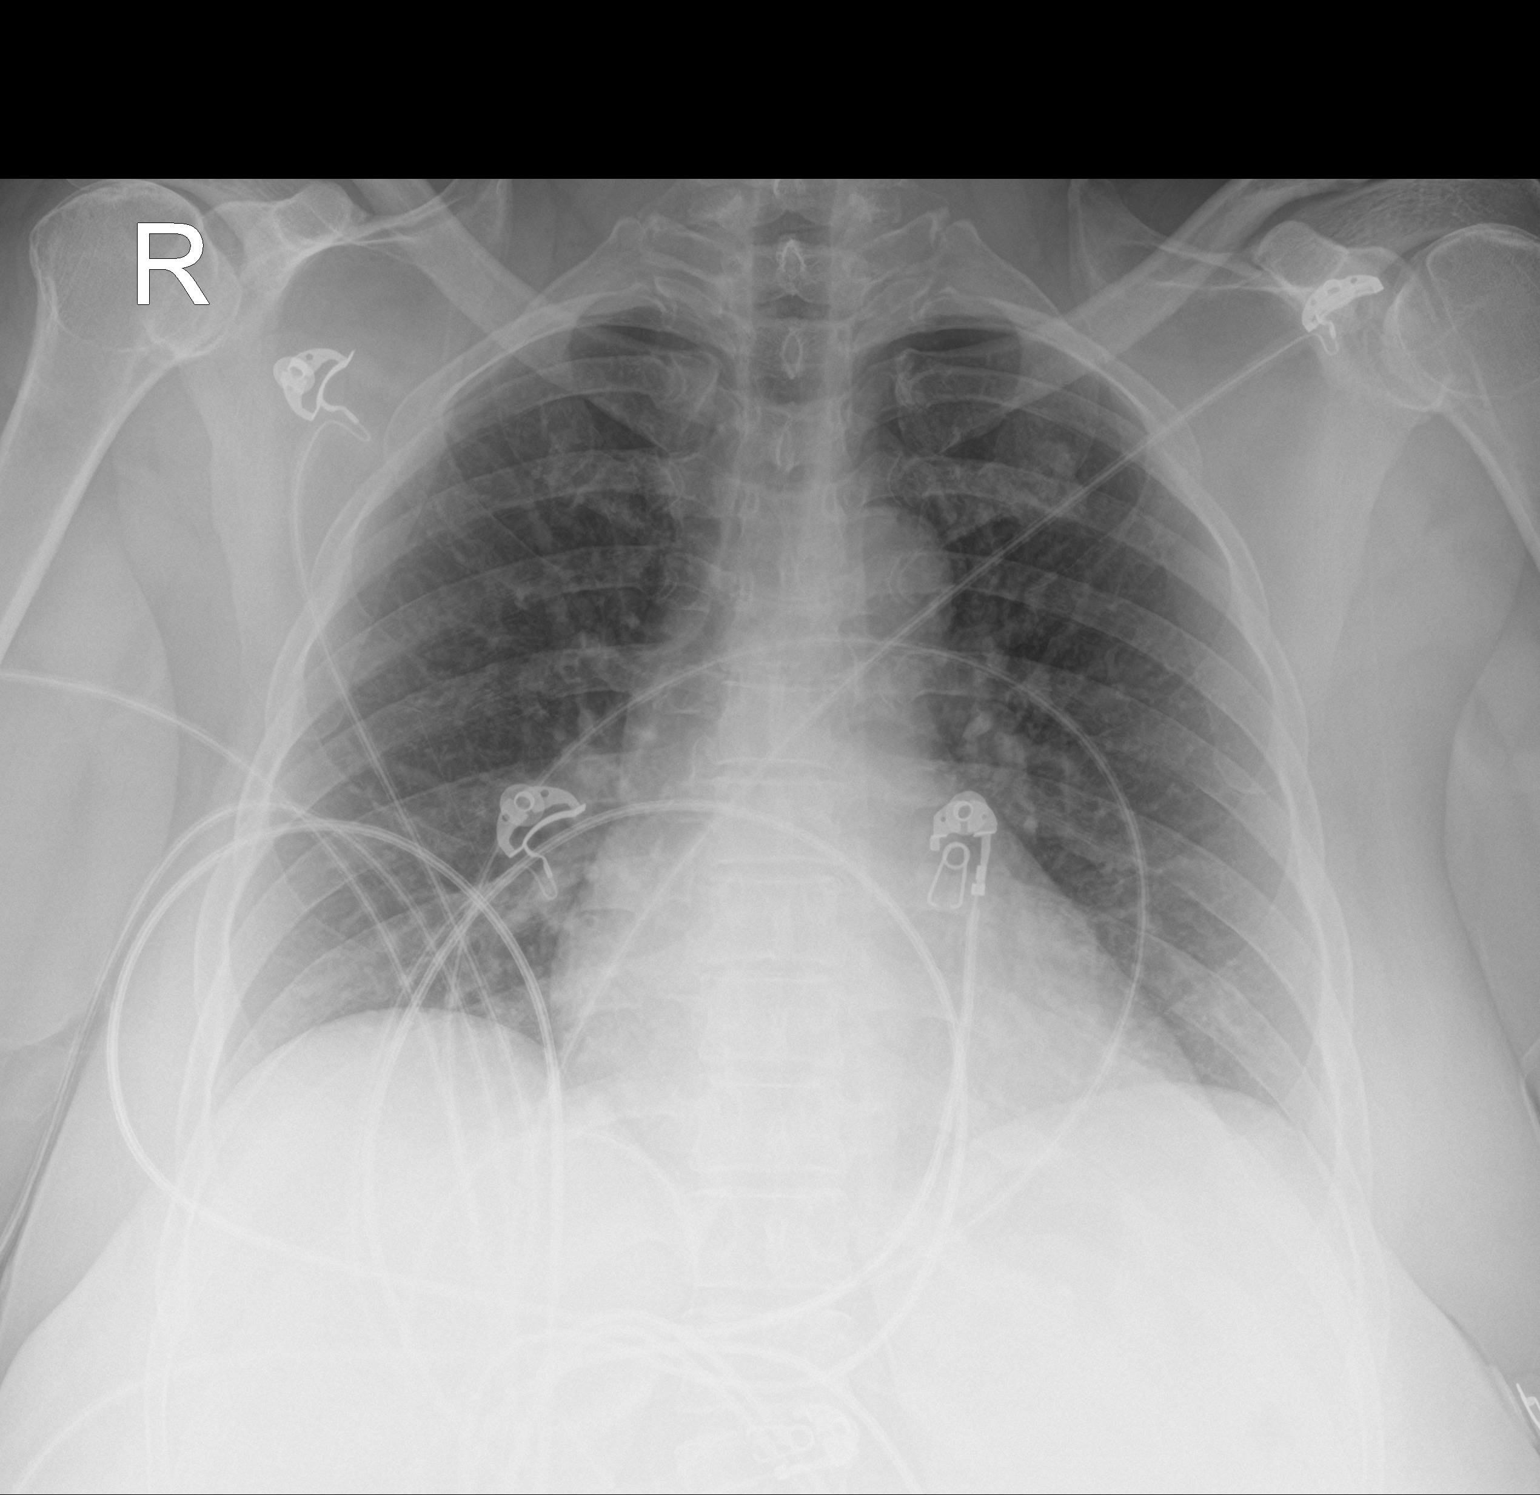

[2 of 2 positions shown; findings below may reference images not displayed]

FINDINGS: The heart size and mediastinal contours are within normal limits.
Both lungs are clear. The visualized skeletal structures are
unremarkable.
IMPRESSION: No active cardiopulmonary disease.

## 2018-12-06 ENCOUNTER — Telehealth: Payer: Self-pay | Admitting: Family Medicine

## 2018-12-06 DIAGNOSIS — E079 Disorder of thyroid, unspecified: Secondary | ICD-10-CM

## 2018-12-06 MED ORDER — LEVOTHYROXINE SODIUM 112 MCG PO TABS: ORAL_TABLET | ORAL | 1 refills | Status: DC

## 2018-12-06 NOTE — Telephone Encounter (Signed)
Spoke with patient and informed her that medications was sent into pharmacy. Informed her that she is due for her 6 month follow up in February. Patient scheduled appointment for 6 month follow up

## 2018-12-06 NOTE — Telephone Encounter (Signed)
Refill on levothyroxine to [email or web address]

## 2019-01-04 ENCOUNTER — Ambulatory Visit: Payer: Medicare Other | Admitting: Physician Assistant

## 2019-01-05 ENCOUNTER — Telehealth: Payer: Self-pay | Admitting: Family Medicine

## 2019-01-08 ENCOUNTER — Other Ambulatory Visit: Payer: Self-pay

## 2019-01-08 DIAGNOSIS — I10 Essential (primary) hypertension: Secondary | ICD-10-CM

## 2019-01-08 MED ORDER — BENAZEPRIL HCL 20 MG PO TABS
20.0000 mg | ORAL_TABLET | Freq: Every day | ORAL | 0 refills | Status: DC
Start: 2019-01-08 — End: 2019-01-24

## 2019-01-09 ENCOUNTER — Encounter: Payer: Self-pay | Admitting: Family Medicine

## 2019-01-09 ENCOUNTER — Ambulatory Visit (INDEPENDENT_AMBULATORY_CARE_PROVIDER_SITE_OTHER): Payer: Medicare Other | Admitting: Family Medicine

## 2019-01-09 VITALS — BP 128/82 | HR 64 | Temp 98.3°F | Resp 15 | Ht 62.0 in | Wt 250.0 lb

## 2019-01-09 DIAGNOSIS — E079 Disorder of thyroid, unspecified: Secondary | ICD-10-CM | POA: Diagnosis not present

## 2019-01-09 DIAGNOSIS — F32A Depression, unspecified: Secondary | ICD-10-CM

## 2019-01-09 DIAGNOSIS — Z6841 Body Mass Index (BMI) 40.0 and over, adult: Secondary | ICD-10-CM

## 2019-01-09 DIAGNOSIS — F329 Major depressive disorder, single episode, unspecified: Secondary | ICD-10-CM | POA: Diagnosis not present

## 2019-01-09 DIAGNOSIS — I10 Essential (primary) hypertension: Secondary | ICD-10-CM | POA: Diagnosis not present

## 2019-01-09 DIAGNOSIS — F419 Anxiety disorder, unspecified: Secondary | ICD-10-CM

## 2019-01-09 DIAGNOSIS — R7301 Impaired fasting glucose: Secondary | ICD-10-CM

## 2019-01-09 MED ORDER — VENLAFAXINE HCL ER 37.5 MG PO CP24: 37.5000 mg | ORAL_CAPSULE | Freq: Every day | ORAL | 0 refills | Status: DC
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
## 2019-01-10 NOTE — Progress Notes (Signed)
TSH elevated, making her chemically hypothyroid.  I did call patient to go over her results and she states that she is taking her Synthroid in the morning along with Protonix and her blood pressure medications.  The Protonix is likely blocking absorption of Synthroid and causing the elevated TSH.  Discussed proper administration of levothyroxine and Protonix, patient will keep levothyroxine in the morning on empty stomach with her other blood pressure pills, she does not eat for 1 hour afterwards.  She will move Protonix to another time a day, at night before she goes to bed and will not eat for 2 hours prior to taking Protonix and going to bed.  She was instructed to do a 6-week recheck of the TSH with just a lab appointment that would be roughly February 21, 2019   Cholesterol mildly elevated - risk low, will not start statin, fasting blood sugar high - adding on A1C  The 10-year ASCVD risk score Mikey Bussing DC Jr., et al., 2013) is: 1.6%   Values used to calculate the score:     Age: 42 years     Sex: Female     Is Non-Hispanic African American: Yes     Diabetic: No     Tobacco smoker: No     Systolic Blood Pressure: 975 mmHg     Is BP treated: Yes     HDL Cholesterol: 61 mg/dL     Total Cholesterol: 188 mg/dL

## 2019-01-12 ENCOUNTER — Encounter: Payer: Self-pay | Admitting: Family Medicine

## 2019-01-12 LAB — CBC WITH DIFFERENTIAL/PLATELET
Absolute Monocytes: 512 cells/uL (ref 200–950)
BASOS PCT: 0.2 %
Basophils Absolute: 12 cells/uL (ref 0–200)
EOS ABS: 73 {cells}/uL (ref 15–500)
Eosinophils Relative: 1.2 %
HCT: 38.7 % (ref 35.0–45.0)
Hemoglobin: 13.1 g/dL (ref 11.7–15.5)
Lymphs Abs: 3428 cells/uL (ref 850–3900)
MCH: 29 pg (ref 27.0–33.0)
MCHC: 33.9 g/dL (ref 32.0–36.0)
MCV: 85.8 fL (ref 80.0–100.0)
MONOS PCT: 8.4 %
MPV: 10.4 fL (ref 7.5–12.5)
NEUTROS PCT: 34 %
Neutro Abs: 2074 cells/uL (ref 1500–7800)
Platelets: 319 10*3/uL (ref 140–400)
RBC: 4.51 10*6/uL (ref 3.80–5.10)
RDW: 14 % (ref 11.0–15.0)
TOTAL LYMPHOCYTE: 56.2 %
WBC: 6.1 10*3/uL (ref 3.8–10.8)

## 2019-01-12 LAB — COMPLETE METABOLIC PANEL WITH GFR
AG Ratio: 1.5 (calc) (ref 1.0–2.5)
ALT: 14 U/L (ref 6–29)
AST: 12 U/L (ref 10–30)
Albumin: 4.2 g/dL (ref 3.6–5.1)
Alkaline phosphatase (APISO): 66 U/L (ref 31–125)
BUN: 9 mg/dL (ref 7–25)
CO2: 27 mmol/L (ref 20–32)
Calcium: 9.2 mg/dL (ref 8.6–10.2)
Chloride: 107 mmol/L (ref 98–110)
Creat: 0.85 mg/dL (ref 0.50–1.10)
GFR, EST AFRICAN AMERICAN: 97 mL/min/{1.73_m2} (ref >=60)
GFR, EST NON AFRICAN AMERICAN: 83 mL/min/{1.73_m2} (ref >=60)
GLUCOSE: 115 mg/dL — AB (ref 65–99)
Globulin: 2.8 g/dL (calc) (ref 1.9–3.7)
POTASSIUM: 3.6 mmol/L (ref 3.5–5.3)
SODIUM: 143 mmol/L (ref 135–146)
Total Bilirubin: 0.5 mg/dL (ref 0.2–1.2)
Total Protein: 7 g/dL (ref 6.1–8.1)

## 2019-01-12 LAB — TEST AUTHORIZATION

## 2019-01-12 LAB — LIPID PANEL
Cholesterol: 188 mg/dL (ref <200)
HDL: 61 mg/dL (ref >=50)
LDL CHOLESTEROL (CALC): 110 mg/dL — AB
NON-HDL CHOLESTEROL (CALC): 127 mg/dL (ref <130)
TRIGLYCERIDES: 76 mg/dL (ref <150)
Total CHOL/HDL Ratio: 3.1 (calc) (ref <5.0)

## 2019-01-12 LAB — HEMOGLOBIN A1C W/OUT EAG: HEMOGLOBIN A1C: 6.1 %{Hb} — AB (ref <5.7)

## 2019-01-12 LAB — TSH: TSH: 4.7 m[IU]/L — AB

## 2019-01-12 NOTE — Assessment & Plan Note (Signed)
Taking medications as prescribed, no side effects Blood pressure at goal today Check renal function and electrolytes Continue medications, try to exercise and work on healthy diet

## 2019-01-12 NOTE — Assessment & Plan Note (Signed)
Recheck labs Pt taking thyroid medication with Protonix, instructed to separate them for proper administration and absorption, no symptoms of chemical hypothyroid, she does have depressed mood
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
## 2019-01-24 ENCOUNTER — Ambulatory Visit (INDEPENDENT_AMBULATORY_CARE_PROVIDER_SITE_OTHER): Payer: Medicare Other | Admitting: Family Medicine

## 2019-01-24 ENCOUNTER — Encounter: Payer: Self-pay | Admitting: Family Medicine

## 2019-01-24 ENCOUNTER — Other Ambulatory Visit: Payer: Self-pay

## 2019-01-24 DIAGNOSIS — F32A Depression, unspecified: Secondary | ICD-10-CM

## 2019-01-24 DIAGNOSIS — F419 Anxiety disorder, unspecified: Secondary | ICD-10-CM

## 2019-01-24 DIAGNOSIS — F329 Major depressive disorder, single episode, unspecified: Secondary | ICD-10-CM | POA: Diagnosis not present

## 2019-01-24 DIAGNOSIS — E079 Disorder of thyroid, unspecified: Secondary | ICD-10-CM | POA: Diagnosis not present

## 2019-01-24 DIAGNOSIS — I10 Essential (primary) hypertension: Secondary | ICD-10-CM | POA: Diagnosis not present

## 2019-01-24 MED ORDER — PANTOPRAZOLE SODIUM 40 MG PO TBEC: 40.0000 mg | DELAYED_RELEASE_TABLET | Freq: Every day | ORAL | 1 refills | Status: DC

## 2019-01-24 MED ORDER — AMLODIPINE BESYLATE 5 MG PO TABS: 5.0000 mg | ORAL_TABLET | Freq: Every day | ORAL | 2 refills | Status: DC

## 2019-01-24 MED ORDER — VENLAFAXINE HCL ER 37.5 MG PO CP24: 37.5000 mg | ORAL_CAPSULE | Freq: Every day | ORAL | 0 refills | Status: DC

## 2019-01-24 MED ORDER — BENAZEPRIL HCL 20 MG PO TABS: 20.0000 mg | ORAL_TABLET | Freq: Every day | ORAL | 2 refills | Status: DC

## 2019-01-24 MED ORDER — LEVOTHYROXINE SODIUM 112 MCG PO TABS: ORAL_TABLET | ORAL | 0 refills | Status: DC

## 2019-01-24 NOTE — Patient Instructions (Signed)
Cholesterol  Cholesterol is a fat. Your body needs a small amount of cholesterol. Cholesterol (plaque) may build up in your blood vessels (arteries). That makes you more likely to have a heart attack or stroke. You cannot feel your cholesterol level. Having a blood test is the only way to find out if your level is high. Keep your test results. Work with your doctor to keep your cholesterol at a good level. What do the results mean?  Total cholesterol is how much cholesterol is in your blood.  LDL is bad cholesterol. This is the type that can build up. Try to have low LDL.  HDL is good cholesterol. It cleans your blood vessels and carries LDL away. Try to have high HDL.  Triglycerides are fat that the body can store or burn for energy. What are good levels of cholesterol?  Total cholesterol below 200.  LDL below 100 is good for people who have health risks. LDL below 70 is good for people who have very high risks.  HDL above 40 is good. It is best to have HDL of 60 or higher.  Triglycerides below 150. How can I lower my cholesterol? Diet Follow your diet program as told by your doctor.  Choose fish, white meat chicken, or [place] that is roasted or baked. Try not to eat red meat, fried foods, sausage, or lunch meats.  Eat lots of fresh fruits and vegetables.  Choose whole grains, beans, pasta, potatoes, and cereals.  Choose olive oil, corn oil, or canola oil. Only use small amounts.  Try not to eat butter, mayonnaise, shortening, or palm kernel oils.  Try not to eat foods with trans fats.  Choose low-fat or nonfat dairy foods. ? Drink skim or nonfat milk. ? Eat low-fat or nonfat yogurt and cheeses. ? Try not to drink whole milk or cream. ? Try not to eat ice cream, egg yolks, or full-fat cheeses.  Healthy desserts include angel food cake, ginger snaps, animal crackers, hard candy, popsicles, and low-fat or nonfat frozen yogurt. Try not to eat pastries, cakes, pies, and  cookies.  Exercise Follow your exercise program as told by your doctor.  Be more active. Try gardening, walking, and taking the stairs.  Ask your doctor about ways that you can be more active. Medicine  Take over-the-counter and prescription medicines only as told by your doctor. This information is not intended to replace advice given to you by your health care provider. Make sure you discuss any questions you have with your health care provider. Document Released: 01/21/2009 Document Revised: 05/26/2016 Document Reviewed: 05/06/2016 Elsevier Interactive Patient Education  2019 [facility]) Exercise Recommendation  Being physically active is important to prevent heart disease and stroke, the nation's No. 1and No. 5killers. To improve overall cardiovascular health, we suggest at least 150 minutes per week of moderate exercise or 75 minutes per week of vigorous exercise (or a combination of moderate and vigorous activity). Thirty minutes a day, five times a week is an easy goal to remember. You will also experience benefits even if you divide your time into two or three segments of 10 to 15 minutes per day.  For people who would benefit from lowering their blood pressure or cholesterol, we recommend 40 minutes of aerobic exercise of moderate to vigorous intensity three to four times a week to lower the risk for heart attack and stroke.  Physical activity is anything that makes you move your body and burn  calories.  This includes things like climbing stairs or playing sports. Aerobic exercises benefit your heart, and include walking, jogging, swimming or biking. Strength and stretching exercises are best for overall stamina and flexibility.  The simplest, positive change you can make to effectively improve your heart health is to start walking. It's enjoyable, free, easy, social and great exercise. A walking program is flexible and boasts high success  rates because people can stick with it. It's easy for walking to become a regular and satisfying part of life.   For Overall Cardiovascular Health:  At least 30 minutes of moderate-intensity aerobic activity at least 5 days per week for a total of 150  OR   At least 25 minutes of vigorous aerobic activity at least 3 days per week for a total of 75 minutes; or a combination of moderate- and vigorous-intensity aerobic activity  AND   Moderate- to high-intensity muscle-strengthening activity at least 2 days per week for additional health benefits.  For Lowering Blood Pressure and Cholesterol  An average 40 minutes of moderate- to vigorous-intensity aerobic activity 3 or 4 times per week  What if I can't make it to the time goal? Something is always better than nothing! And everyone has to start somewhere. Even if you've been sedentary for years, today is the day you can begin to make healthy changes in your life. If you don't think you'll make it for 30 or 40 minutes, set a reachable goal for today. You can work up toward your overall goal by increasing your time as you get stronger. Don't let all-or-nothing thinking rob you of doing what you can every day.  Source:http://www.heart.org

## 2019-01-24 NOTE — Assessment & Plan Note (Signed)
Med refills BP well controlled - see last visit for A&P, no change

## 2019-01-24 NOTE — Assessment & Plan Note (Signed)
Improving with effexor, no SE, better mood, energy, sleep, decreased nightmares Passive SI significantly less often, continues to denies active SI/plan, denies HI/AVH Same dose, pt to contact us if she feels like she needs dose increase.  Recheck @ 3 month routine f/up appt, early June

## 2019-01-24 NOTE — Assessment & Plan Note (Signed)
See above

## 2019-01-24 NOTE — Assessment & Plan Note (Signed)
Continue to take in am on empty stomach and protonix QHS Pt notes improvement already in energy and mood  Lab only appt in 4 weeks - repeat TSH

## 2019-01-24 NOTE — Progress Notes (Signed)
Patient ID: Janice Roberts, female    DOB: 1976-05-18, 43 y.o.   MRN: [id number]  PCP: Delsa Grana, PA-C  Chief Complaint  Patient presents with  . Medication    follow up   Subjective:   Janice Roberts is a 43 y.o. female, presents to clinic with CC of depression, new med follow up and has questions about her lab results. She started taking effexor and she has felt a big improvement in her mood, she is sleeping about 7 hours now, she has more energy when she wakes up, and she is not having nightmares anymore.   Her depressive thoughts, especially the thoughts like "I wish I just wouldn't wake up tomorrow" (aka passive SI) are rarely occurring.  She feels more energized when she does wake up.  And she feels more like herself.  She has also adjusted how she takes her medication - protonix she is now taking at night, and synthroid by itself in the morning.    She has questions about + labs, so her recent labs were reviewed here, mildly elevated LDL, and low risk, reviewed ASCVD risk calculator.   Patient Active Problem List   Diagnosis Date Noted  . Endometriosis determined by laparoscopy 02/11/2017  . PID (acute pelvic inflammatory disease) 11/17/2016  . Pelvic pain 11/15/2016  . Family history of breast cancer in mother 10/21/2016  . Pelvic pain in female 11/10/2015  . Anxiety   . Depression   . Asthma   . Obesity   . Hypertension   . Thyroid disease      Prior to Admission medications   Medication Sig Start Date End Date Taking? Authorizing Provider  Albuterol Sulfate (PROAIR RESPICLICK) 016 (90 Base) MCG/ACT AEPB Inhale 2 puffs into the lungs every 6 (six) hours. As needed for wheezing or shortness of breath 06/13/18  Yes Dena Billet B, PA-C  amLODipine (NORVASC) 5 MG tablet Take 1 tablet (5 mg total) by mouth daily. 05/22/18  Yes Dena Billet B, PA-C  benazepril (LOTENSIN) 20 MG tablet Take 1 tablet (20 mg total) by mouth daily. 01/08/19  Yes Delsa Grana, PA-C  ibuprofen  (ADVIL,MOTRIN) 800 MG tablet Take 1 tablet (800 mg total) by mouth every 8 (eight) hours as needed. 12/13/17  Yes Moss, Museum/gallery conservator, DO  levothyroxine (SYNTHROID, LEVOTHROID) 112 MCG tablet TAKE 1 TABLET BY MOUTH ONCE DAILY 12/06/18  Yes Delsa Grana, PA-C  venlafaxine XR (EFFEXOR XR) 37.5 MG 24 hr capsule Take 1 capsule (37.5 mg total) by mouth daily with breakfast. 01/09/19  Yes Delsa Grana, PA-C     Allergies  Allergen Reactions  . Ivp Dye [Iodinated Diagnostic Agents] Anaphylaxis and Other (See Comments)    "Almost died"  . Oxycodone   . Penicillins Hives and Other (See Comments)    Has patient had a PCN reaction causing immediate rash, facial/tongue/throat swelling, SOB or lightheadedness with hypotension: No Has patient had a PCN reaction causing severe rash involving mucus membranes or skin necrosis: Yes Has patient had a PCN reaction that required hospitalization No Has patient had a PCN reaction occurring within the last 10 years: No If all of the above answers are "NO", then may proceed with Cephalosporin use.   Marland Kitchen Percocet [Oxycodone-Acetaminophen] Itching     Family History  Problem Relation Age of Onset  . Cancer Mother 52       Breast  . Depression Mother   . Hearing loss Mother   . Hypertension Mother   . Stroke  Mother   . Asthma Brother   . Birth defects Sister      Social History   Socioeconomic History  . Marital status: Single    Spouse name: Not on file  . Number of children: Not on file  . Years of education: Not on file  . Highest education level: Not on file  Occupational History  . Not on file  Social Needs  . Financial resource strain: Not on file  . Food insecurity:    Worry: Not on file    Inability: Not on file  . Transportation needs:    Medical: Not on file    Non-medical: Not on file  Tobacco Use  . Smoking status: Never Smoker  . Smokeless tobacco: Never Used  Substance and Sexual Activity  . Alcohol use: No  . Drug use: No  . Sexual  activity: Not Currently    Birth control/protection: None  Lifestyle  . Physical activity:    Days per week: Not on file    Minutes per session: Not on file  . Stress: Not on file  Relationships  . Social connections:    Talks on phone: Not on file    Gets together: Not on file    Attends religious service: Not on file    Active member of club or organization: Not on file    Attends meetings of clubs or organizations: Not on file    Relationship status: Not on file  . Intimate partner violence:    Fear of current or ex partner: Not on file    Emotionally abused: Not on file    Physically abused: Not on file    Forced sexual activity: Not on file  Other Topics Concern  . Not on file  Social History Narrative  . Not on file     Review of Systems  Constitutional: Negative.   HENT: Negative.   Eyes: Negative.   Respiratory: Negative.   Cardiovascular: Negative.   Gastrointestinal: Negative.   Endocrine: Negative.   Genitourinary: Negative.   Musculoskeletal: Negative.   Skin: Negative.   Allergic/Immunologic: Negative.   Neurological: Negative.   Hematological: Negative.   Psychiatric/Behavioral: Negative.   All other systems reviewed and are negative.      Objective:    Vitals:   01/24/19 0931  BP: 106/78  Pulse: 62  Temp: 97.8 F (36.6 C)  SpO2: 99%  Weight: 245 lb (111.1 kg)      Physical Exam Vitals signs and nursing note reviewed.  Constitutional:      General: She is not in acute distress.    Appearance: Normal appearance. She is well-developed. She is not ill-appearing, toxic-appearing or diaphoretic.     Comments: Pt well appearing, smiling  HENT:     Head: Normocephalic and atraumatic.     Nose: Nose normal.     Mouth/Throat:     Mouth: Mucous membranes are moist.     Pharynx: Oropharynx is clear.  Eyes:     General:        Right eye: No discharge.        Left eye: No discharge.     Conjunctiva/sclera: Conjunctivae normal.  Neck:      Trachea: No tracheal deviation.  Cardiovascular:     Rate and Rhythm: Normal rate and regular rhythm.     Pulses: Normal pulses.  Pulmonary:     Effort: Pulmonary effort is normal. No respiratory distress.     Breath sounds: Normal breath sounds.  No stridor.  Musculoskeletal: Normal range of motion.  Skin:    General: Skin is warm and dry.     Findings: No rash.  Neurological:     Mental Status: She is alert.     Motor: No abnormal muscle tone.     Coordination: Coordination normal.  Psychiatric:        Attention and Perception: Attention normal.        Mood and Affect: Mood and affect normal.        Speech: Speech normal.        Behavior: Behavior normal. Behavior is cooperative.        Thought Content: Thought content does not include homicidal or suicidal ideation. Thought content does not include homicidal or suicidal plan.     Depression screen [facility] 2/9 01/09/2019 07/26/2018 07/03/2018  Decreased Interest 3 3 2   Down, Depressed, Hopeless 3 3 3   PHQ - 2 Score 6 6 5   Altered sleeping 3 3 1   Tired, decreased energy 3 3 1   Change in appetite 0 3 2  Feeling bad or failure about yourself  3 3 1   Trouble concentrating 3 3 2   Moving slowly or fidgety/restless 3 3 2   Suicidal thoughts - 3 2  PHQ-9 Score 21 27 16   Difficult doing work/chores Somewhat difficult Somewhat difficult Somewhat difficult  Some recent data might be hidden        Assessment & Plan:    43 y/o AAF presents for recheck on mood/depression/anxiety after 2 weeks of effexor, she is pleasant, smiling, well appearing, improved eye contact, and much less guarded affect/mood.    Problem List Items Addressed This Visit      Cardiovascular and Mediastinum   Hypertension    Med refills BP well controlled - see last visit for A&P, no change      Relevant Medications   benazepril (LOTENSIN) 20 MG tablet   amLODipine (NORVASC) 5 MG tablet     Endocrine   Thyroid disease    Continue to take in am on empty  stomach and protonix QHS Pt notes improvement already in energy and mood  Lab only appt in 4 weeks - repeat TSH      Relevant Medications   levothyroxine (SYNTHROID, LEVOTHROID) 112 MCG tablet     Other   Anxiety    See above      Relevant Medications   venlafaxine XR (EFFEXOR XR) 37.5 MG 24 hr capsule   Depression    Improving with effexor, no SE, better mood, energy, sleep, decreased nightmares Passive SI significantly less often, continues to denies active SI/plan, denies HI/AVH Same dose, pt to contact us if she feels like she needs dose increase.  Recheck @ 3 month routine f/up appt, early June       Relevant Medications   venlafaxine XR (EFFEXOR XR) 37.5 MG 24 hr capsule          Delsa Grana, PA-C 01/24/19 9:55 AM

## 2019-02-27 ENCOUNTER — Ambulatory Visit: Payer: Medicare Other | Admitting: Family Medicine

## 2019-02-27 ENCOUNTER — Other Ambulatory Visit: Payer: Medicare Other

## 2019-02-27 ENCOUNTER — Ambulatory Visit: Payer: Self-pay | Admitting: Family Medicine

## 2019-03-05 ENCOUNTER — Ambulatory Visit: Payer: Medicare Other

## 2019-03-05 ENCOUNTER — Telehealth: Payer: Self-pay | Admitting: Family Medicine

## 2019-03-05 ENCOUNTER — Other Ambulatory Visit: Payer: Self-pay

## 2019-03-05 ENCOUNTER — Other Ambulatory Visit: Payer: Medicare Other

## 2019-03-05 VITALS — BP 118/88

## 2019-03-05 DIAGNOSIS — E079 Disorder of thyroid, unspecified: Secondary | ICD-10-CM

## 2019-03-05 DIAGNOSIS — Z0130 Encounter for examination of blood pressure without abnormal findings: Secondary | ICD-10-CM

## 2019-03-05 NOTE — Telephone Encounter (Signed)
Patient came in for a BP check. Her BP was 118/88 in the left arm while sitting. Do you recommend any changes? Please advise?

## 2019-03-05 NOTE — Progress Notes (Signed)
Patient came in today for a BP check. BP at rest in the left arm with large cuff was 118/88. PCP to be notified.

## 2019-03-06 ENCOUNTER — Other Ambulatory Visit: Payer: Self-pay | Admitting: Family Medicine

## 2019-03-06 DIAGNOSIS — E079 Disorder of thyroid, unspecified: Secondary | ICD-10-CM

## 2019-03-06 LAB — TSH: TSH: 1.08 mIU/L

## 2019-03-06 MED ORDER — LEVOTHYROXINE SODIUM 112 MCG PO TABS: ORAL_TABLET | ORAL | 3 refills | Status: DC

## 2019-03-06 NOTE — Telephone Encounter (Signed)
Sounds great - thanks.  I will note no changes and attach that to her lab result note to [place].  Thank you

## 2019-03-20 ENCOUNTER — Encounter: Payer: Self-pay | Admitting: Family Medicine

## 2019-03-20 ENCOUNTER — Other Ambulatory Visit: Payer: Self-pay

## 2019-03-20 ENCOUNTER — Ambulatory Visit (INDEPENDENT_AMBULATORY_CARE_PROVIDER_SITE_OTHER): Payer: Medicare Other | Admitting: Family Medicine

## 2019-03-20 DIAGNOSIS — R440 Auditory hallucinations: Secondary | ICD-10-CM

## 2019-03-20 DIAGNOSIS — G4700 Insomnia, unspecified: Secondary | ICD-10-CM

## 2019-03-20 DIAGNOSIS — F39 Unspecified mood [affective] disorder: Secondary | ICD-10-CM

## 2019-03-20 DIAGNOSIS — F419 Anxiety disorder, unspecified: Secondary | ICD-10-CM | POA: Diagnosis not present

## 2019-03-20 MED ORDER — VENLAFAXINE HCL ER 75 MG PO CP24: 75.0000 mg | ORAL_CAPSULE | Freq: Every day | ORAL | 0 refills | Status: DC

## 2019-03-20 NOTE — Progress Notes (Signed)
Patient ID: Janice Roberts, female    DOB: May 22, 1976, 43 y.o.   MRN: [id number]  PCP: Delsa Grana, PA-C   Virtual Visit via Telephone Note  Phone visit arranged with Janice Roberts for 03/20/19 at 11:30 AM EDT  Services provided today were via telemedicine through telephone call. Start of phone call:  11:41 AM  I verified that I was speaking with the correct person using two identifiers. Patient reported their location during encounter was in her car driving (I offered to call her back but she wanted to talk while in her car, she had a "headset on")  Patient consented to telephone visit  I conducted telephone visit from [facility]  Referring Provider:   Delsa Grana, PA-C   All participants in encounter:  Myself and the patient   I discussed the limitations, risks, security and privacy concerns of performing an evaluation and management service by telephone and the availability of in person appointments. I also discussed with the patient that there may be a patient responsible charge related to this service. The patient expressed understanding and agreed to proceed.  Chief Complaint  Patient presents with   Depression    Subjective:   Janice Roberts is a 43 y.o. female with CC depression and anxiety/PTSD and insomnia.  Started effexor about 2 months ago, it was effective for a little over a month, she was able to sleep better and mood improved, less anxious.  About 2 weeks ago she called stating it was not working any more.  She never increased dose from 37.5 to 75 mg.  For the past 2 weeks she has taken 2 pills in the am.  She is sleeping better but does not feel rested.  She does feel sluggish all day long.  Her mood is still better.  She continues to talk to her therapist.  She states that she used to have hallucinations and her therapist told her she needs to tell me about them.  She says that she used to have hallucinations before, she would see  shadows, she would feel like someone was watching here.  Lately she feels it is happening again, she hears multiple voices in her car, they are all talking to each other.  She denies any current visual hallucinations.  She denies SI and HI.  She does not see a psychiatrist.    Insomnia- she is still having trouble falling asleep, takes several hours.  Doesn't feel well rested.  Not currently having nightmares.  She does snore.  Never had a sleep study.   HPI    Patient Active Problem List   Diagnosis Date Noted   Endometriosis determined by laparoscopy 02/11/2017   PID (acute pelvic inflammatory disease) 11/17/2016   Pelvic pain 11/15/2016   Family history of breast cancer in mother 10/21/2016   Pelvic pain in female 11/10/2015   Anxiety    Depression    Asthma    Obesity    Hypertension    Thyroid disease     Prior to Admission medications   Medication Sig Start Date End Date Taking? Authorizing Provider  Albuterol Sulfate (PROAIR RESPICLICK) 782 (90 Base) MCG/ACT AEPB Inhale 2 puffs into the lungs every 6 (six) hours. As needed for wheezing or shortness of breath 06/13/18   Dena Billet B, PA-C  amLODipine (NORVASC) 5 MG tablet Take 1 tablet (5 mg total) by mouth daily. 01/24/19   Delsa Grana, PA-C  benazepril (LOTENSIN) 20 MG  tablet Take 1 tablet (20 mg total) by mouth daily. 01/24/19   Delsa Grana, PA-C  ibuprofen (ADVIL,MOTRIN) 800 MG tablet Take 1 tablet (800 mg total) by mouth every 8 (eight) hours as needed. 12/13/17   Moss, Amber, DO  levothyroxine (SYNTHROID) 112 MCG tablet TAKE 1 TABLET BY MOUTH ONCE DAILY 03/06/19   Delsa Grana, PA-C  pantoprazole (PROTONIX) 40 MG tablet Take 1 tablet (40 mg total) by mouth at bedtime. 01/24/19   Delsa Grana, PA-C  venlafaxine XR (EFFEXOR XR) 37.5 MG 24 hr capsule Take 1 capsule (37.5 mg total) by mouth daily with breakfast. 01/24/19   Delsa Grana, PA-C    Allergies  Allergen Reactions   Ivp Dye [Iodinated Diagnostic Agents]  Anaphylaxis and Other (See Comments)    "Almost died"   Oxycodone    Penicillins Hives and Other (See Comments)    Has patient had a PCN reaction causing immediate rash, facial/tongue/throat swelling, SOB or lightheadedness with hypotension: No Has patient had a PCN reaction causing severe rash involving mucus membranes or skin necrosis: Yes Has patient had a PCN reaction that required hospitalization No Has patient had a PCN reaction occurring within the last 10 years: No If all of the above answers are "NO", then may proceed with Cephalosporin use.    Percocet [Oxycodone-Acetaminophen] Itching    Review of Systems  Constitutional: Negative.   HENT: Negative.   Eyes: Negative.   Respiratory: Negative.   Cardiovascular: Negative.   Gastrointestinal: Negative.   Endocrine: Negative.   Genitourinary: Negative.   Musculoskeletal: Negative.   Skin: Negative.   Allergic/Immunologic: Negative.   Neurological: Negative.   Hematological: Negative.   Psychiatric/Behavioral: Negative.   All other systems reviewed and are negative.      Objective:    There were no vitals filed for this visit.    Physical Exam Neurological:     Mental Status: She is alert.  Psychiatric:        Attention and Perception: Attention normal.        Mood and Affect: Mood normal.        Speech: Speech normal.        Behavior: Behavior is cooperative.        Thought Content: Thought content does not include homicidal or suicidal ideation. Thought content does not include homicidal or suicidal plan.     Comments: Thought content slightly paranoid noting history of seeing shadows folowing her and hearing voices of people in her car          Assessment & Plan:     ICD-10-CM   1. Mood disorder (HCC) F39 venlafaxine XR (EFFEXOR XR) 75 MG 24 hr capsule    Ambulatory referral to Psychiatry  2. Auditory hallucinations R44.0 Ambulatory referral to Psychiatry  3. Insomnia, unspecified type G47.00  Ambulatory referral to Psychiatry  4. Anxiety F41.9 venlafaxine XR (EFFEXOR XR) 75 MG 24 hr capsule    Ambulatory referral to Psychiatry    Continue same dose of effexor 75 mg, change to taking at bedtime, follow up in one week. Discussed AVH, HI and SI, pt specifically instructed to go to behavioral health hospital or ER if having any worse AVH, or any HI or SI at all.  If her auditory hallucinations were the same, and non-threatening she still needed urgent psychiatry eval - I put in referral but she was instructed to also call her insurance and call available providers in her network.    For insomnia - may have sleep  apnea?  She has such bad PTSD, she has difficulty being around even men that are family members, so I believe it would be tricky to get a study done unless it was done in her home - with sleep study and titration at home.  I explained that I will start the process and wanted her to let me know what she couldn't handle or do.   Also considered add on of meds for insomnia?   First adjust when shes taking effexor and see if sleep improves.   GERD still doing well with protonix at night TSH - last labs reviewed with pt, continue to take same dose in the AM  I discussed the assessment and treatment plan with the patient. The patient was provided an opportunity to ask questions and all were answered. The patient agreed with the plan and demonstrated an understanding of the instructions.   The patient was advised to call back or seek an in-person evaluation if the symptoms worsen or if the condition fails to improve as anticipated.  Phone call concluded at 11:58 AM  I provided 17 minutes of non-face-to-face time during this encounter.  Delsa Grana, PA-C 03/20/19 12:07 PM

## 2019-05-24 ENCOUNTER — Encounter (HOSPITAL_COMMUNITY): Payer: Self-pay | Admitting: Psychiatry

## 2019-05-24 ENCOUNTER — Ambulatory Visit (HOSPITAL_COMMUNITY): Payer: Medicare Other | Admitting: Psychiatry

## 2019-05-24 ENCOUNTER — Other Ambulatory Visit: Payer: Self-pay
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
## 2019-07-11 ENCOUNTER — Ambulatory Visit
Admission: EM | Admit: 2019-07-11 | Discharge: 2019-07-11 | Disposition: A | Discharge status: Home / Self Care | Payer: Medicare Other | Attending: Emergency Medicine | Admitting: Emergency Medicine

## 2019-07-11 ENCOUNTER — Encounter: Payer: Self-pay | Admitting: Emergency Medicine

## 2019-07-11 ENCOUNTER — Other Ambulatory Visit: Payer: Self-pay

## 2019-07-11 DIAGNOSIS — N3001 Acute cystitis with hematuria: Secondary | ICD-10-CM | POA: Diagnosis not present

## 2019-07-11 LAB — POCT URINALYSIS DIP (MANUAL ENTRY)
Bilirubin, UA: NEGATIVE
Glucose, UA: NEGATIVE mg/dL
Ketones, POC UA: NEGATIVE mg/dL
Nitrite, UA: NEGATIVE
Protein Ur, POC: 100 mg/dL — AB
Spec Grav, UA: >=1.03 — AB (ref 1.010–1.025)
Urobilinogen, UA: 0.2 E.U./dL
pH, UA: 6 (ref 5.0–8.0)

## 2019-07-11 MED ORDER — NITROFURANTOIN MONOHYD MACRO 100 MG PO CAPS: 100.0000 mg | ORAL_CAPSULE | Freq: Two times a day (BID) | ORAL | 0 refills | Status: DC

## 2019-07-11 MED ORDER — FLUCONAZOLE 200 MG PO TABS: 200.0000 mg | ORAL_TABLET | Freq: Once | ORAL | 0 refills | Status: AC

## 2019-07-11 NOTE — ED Provider Notes (Signed)
[facility]    CSN: [id number] Arrival date & time: 07/11/19  [id number]      History   Chief Complaint Chief Complaint  Patient presents with  . Dysuria    HPI Janice Roberts is a 43 y.o. female is history of hypertension, hypothyroidism presenting for 24-hour course of urinary frequency, malodor, right-sided abdominal pain.  Patient is status post hysterectomy, not currently menstruating.  Denies vaginal or pelvic pain, vaginal discharge, vaginal bleeding.  Past Medical History:  Diagnosis Date  . Anxiety    no meds  . Asthma    rarely uses inhaler - seasonal with allergies  . Depression    no med  . Hypertension   . Hypothyroidism   . Obesity   . Seasonal allergies   . SVD (spontaneous vaginal delivery) 1997   x   . Thyroid disease     Patient Active Problem List   Diagnosis Date Noted  . Endometriosis determined by laparoscopy 02/11/2017  . PID (acute pelvic inflammatory disease) 11/17/2016  . Pelvic pain 11/15/2016  . Family history of breast cancer in mother 10/21/2016  . Pelvic pain in female 11/10/2015  . Anxiety   . Depression   . Asthma   . Obesity   . Hypertension   . Thyroid disease     Past Surgical History:  Procedure Laterality Date  . ABDOMINAL HYSTERECTOMY    . APPENDECTOMY    . [place]   x   . [place] OF UTERUS  08/08/2008 & 03/08/2013   2009 missed ab, 2014 bleeding  . LAPAROSCOPIC VAGINAL HYSTERECTOMY WITH SALPINGO OOPHORECTOMY Bilateral 12/06/2017   Procedure: LAPAROSCOPIC ASSISTED VAGINAL HYSTERECTOMY WITH SALPINGO OOPHORECTOMY;  Surgeon: Woodroe Mode, MD;  Location: [facility];  Service: Gynecology;  Laterality: Bilateral;  . LAPAROSCOPY N/A 01/24/2017   Procedure: LAPAROSCOPY DIAGNOSTIC;  Surgeon: Woodroe Mode, MD;  Location: [facility];  Service: Gynecology;  Laterality: N/A;  . SHOULDER SURGERY Left   . THYROIDECTOMY    . WISDOM TOOTH EXTRACTION      OB History    Gravida  10   Para  2   Term  2   Preterm  0   AB  8   Living  2     SAB  1   TAB  7   Ectopic      Multiple      Live Births  2            Home Medications    Prior to Admission medications   Medication Sig Start Date End Date Taking? Authorizing Provider  Albuterol Sulfate (PROAIR RESPICLICK) [id number] (90 Base) MCG/ACT AEPB Inhale 2 puffs into the lungs every 6 (six) hours. As needed for wheezing or shortness of breath 06/13/18  Yes Dena Billet B, PA-C  amLODipine (NORVASC) 5 MG tablet Take 1 tablet (5 mg total) by mouth daily. 01/24/19   Delsa Grana, PA-C  benazepril (LOTENSIN) 20 MG tablet Take 1 tablet (20 mg total) by mouth daily. 01/24/19   Delsa Grana, PA-C  fluconazole (DIFLUCAN) 200 MG tablet Take 1 tablet (200 mg total) by mouth once for 1 dose. May repeat in 72 hours if needed 07/11/19 07/11/19  Hall-Potvin, [place], PA-C  ibuprofen (ADVIL,MOTRIN) 800 MG tablet Take 1 tablet (800 mg total) by mouth every 8 (eight) hours as needed. Patient not taking: Reported on 03/20/2019 12/13/17   Dannielle Huh, DO  levothyroxine (SYNTHROID) 112 MCG tablet TAKE 1 TABLET BY  MOUTH ONCE DAILY 03/06/19   Delsa Grana, PA-C  nitrofurantoin, macrocrystal-monohydrate, (MACROBID) 100 MG capsule Take 1 capsule (100 mg total) by mouth 2 (two) times daily. 07/11/19   Hall-Potvin, [place], PA-C  pantoprazole (PROTONIX) 40 MG tablet Take 1 tablet (40 mg total) by mouth at bedtime. 01/24/19 07/11/19  Delsa Grana, PA-C  venlafaxine XR (EFFEXOR XR) 75 MG 24 hr capsule Take 1 capsule (75 mg total) by mouth at bedtime. 03/20/19 07/11/19  Delsa Grana, PA-C    Family History Family History  Problem Relation Age of Onset  . Cancer Mother 37       Breast  . Depression Mother   . Hearing loss Mother   . Hypertension Mother   . Stroke Mother   . Asthma Brother   . Birth defects Sister     Social History Social History   Tobacco Use  . Smoking status: Never Smoker  . Smokeless tobacco: Never Used  Substance Use Topics  .  Alcohol use: No  . Drug use: No     Allergies   Ivp dye [iodinated diagnostic agents], Oxycodone, Penicillins, and Percocet [oxycodone-acetaminophen]   Review of Systems Review of Systems  Constitutional: Negative for fatigue and fever.  Respiratory: Negative for cough and shortness of breath.   Cardiovascular: Negative for chest pain and palpitations.  Gastrointestinal: Positive for abdominal pain. Negative for abdominal distention, blood in stool, constipation, diarrhea, nausea and vomiting.  Genitourinary: Positive for dysuria, frequency and urgency. Negative for flank pain, hematuria, pelvic pain, vaginal bleeding, vaginal discharge and vaginal pain.     Physical Exam Triage Vital Signs ED Triage Vitals [07/11/19 0840]  Enc Vitals Group     BP 123/87     Pulse Rate 71     Resp 18     Temp 98.4 F (36.9 C)     Temp Source Oral     SpO2 98 %     Weight      Height      Head Circumference      Peak Flow      Pain Score 0     Pain Loc      Pain Edu?      Excl. in [place]?    No data found.  Updated Vital Signs BP 123/87 (BP Location: Left Arm)   Pulse 71   Temp 98.4 F (36.9 C) (Oral)   Resp 18   LMP 09/30/2017 (Within Days)   SpO2 98%   Visual Acuity Right Eye Distance:   Left Eye Distance:   Bilateral Distance:    Right Eye Near:   Left Eye Near:    Bilateral Near:     Physical Exam Constitutional:      General: She is not in acute distress. HENT:     Head: Normocephalic and atraumatic.  Eyes:     General: No scleral icterus.    Pupils: Pupils are equal, round, and reactive to light.  Cardiovascular:     Rate and Rhythm: Normal rate.  Pulmonary:     Effort: Pulmonary effort is normal.  Abdominal:     General: Bowel sounds are normal.     Palpations: Abdomen is soft.     Tenderness: There is no abdominal tenderness. There is no right CVA tenderness, left CVA tenderness or guarding.  Skin:    Coloration: Skin is not jaundiced or pale.   Neurological:     Mental Status: She is alert and oriented to person, place, and time.  UC Treatments / Results  Labs (all labs ordered are listed, but only abnormal results are displayed) Labs Reviewed  POCT URINALYSIS DIP (MANUAL ENTRY) - Abnormal; Notable for the following components:      Result Value   Clarity, UA cloudy (*)    Spec Grav, UA >=1.030 (*)    Blood, UA large (*)    Protein Ur, POC =100 (*)    Leukocytes, UA Trace (*)    All other components within normal limits  URINE CULTURE    EKG   Radiology No results found.  Procedures Procedures (including critical care time)  Medications Ordered in UC Medications - No data to display  Initial Impression / Assessment and Plan / UC Course  I have reviewed the triage vital signs and the nursing notes.  Pertinent labs & imaging results that were available during my care of the patient were reviewed by me and considered in my medical decision making (see chart for details).     1.  Acute cystitis with hematuria POCT urinalysis done in office, reviewed by me: significant for specific gravity 1.030, large RBC, 100 protein, trace leukocytes.  Culture pending.  Will start Macrobid given patient's history of allergic reaction to penicillin, dilution of symptoms with Macrobid in the past.  Diflucan sent as patient tends to get yeast infections status post antibiotic use.  Return precautions discussed, patient verbalized understanding and is agreeable to plan. Final Clinical Impressions(s) / UC Diagnoses   Final diagnoses:  Acute cystitis with hematuria     Discharge Instructions     Take antibiotic as directed. We will call you if you need to change her antibiotic based on your culture results. Return for worsening symptoms, fever, back or belly pain.    ED Prescriptions    Medication Sig Dispense Auth. Provider   nitrofurantoin, macrocrystal-monohydrate, (MACROBID) 100 MG capsule Take 1 capsule (100 mg  total) by mouth 2 (two) times daily. 10 capsule Hall-Potvin, [place], PA-C   fluconazole (DIFLUCAN) 200 MG tablet Take 1 tablet (200 mg total) by mouth once for 1 dose. May repeat in 72 hours if needed 2 tablet Hall-Potvin, [place], PA-C     Controlled Substance Prescriptions [place] Controlled Substance Registry consulted? Not Applicable   Quincy Sheehan, [place] 07/11/19 [id number]

## 2019-07-11 NOTE — Discharge Instructions (Addendum)
Take antibiotic as directed. We will call you if you need to change her antibiotic based on your culture results. Return for worsening symptoms, fever, back or belly pain.

## 2019-07-11 NOTE — ED Notes (Signed)
Patient able to ambulate independently  

## 2019-07-11 NOTE — ED Triage Notes (Signed)
Pt presents to [facility] for assessment of 24 hours of urinary frequency, malodorous urine, and right sided abdominal pain.  Denies fever or chills.

## 2019-07-12 ENCOUNTER — Telehealth: Payer: Self-pay | Admitting: Emergency Medicine

## 2019-07-13 ENCOUNTER — Encounter: Payer: Self-pay | Admitting: Family Medicine

## 2019-07-13 ENCOUNTER — Other Ambulatory Visit: Payer: Self-pay

## 2019-07-13 ENCOUNTER — Ambulatory Visit (INDEPENDENT_AMBULATORY_CARE_PROVIDER_SITE_OTHER): Payer: Medicare Other | Admitting: Family Medicine

## 2019-07-13 ENCOUNTER — Telehealth (HOSPITAL_COMMUNITY): Payer: Self-pay | Admitting: Emergency Medicine

## 2019-07-13 VITALS — BP 108/70 | HR 80 | Temp 98.3°F | Resp 18 | Ht 62.0 in | Wt 248.6 lb

## 2019-07-13 DIAGNOSIS — F419 Anxiety disorder, unspecified: Secondary | ICD-10-CM

## 2019-07-13 DIAGNOSIS — F32A Depression, unspecified: Secondary | ICD-10-CM

## 2019-07-13 DIAGNOSIS — R440 Auditory hallucinations: Secondary | ICD-10-CM

## 2019-07-13 DIAGNOSIS — F329 Major depressive disorder, single episode, unspecified: Secondary | ICD-10-CM

## 2019-07-13 DIAGNOSIS — F4310 Post-traumatic stress disorder, unspecified: Secondary | ICD-10-CM | POA: Diagnosis not present

## 2019-07-13 DIAGNOSIS — M3500 Sicca syndrome, unspecified: Secondary | ICD-10-CM

## 2019-07-13 LAB — URINE CULTURE: Culture: >=100000 — AB

## 2019-07-13 NOTE — Telephone Encounter (Signed)
PT given macrobid, shows resistance. Needs to be changed. Attempted to reach patient. No answer at this time. Voicemail left.
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
## 2019-07-13 NOTE — Patient Instructions (Addendum)
Please go to [facility]   I will call your daughter F/U pending hospital evaluation

## 2019-07-13 NOTE — Assessment & Plan Note (Signed)
Patient with severe depression anxiety PTSD with auditory hallucinations and suicidal ideations.  I spoke with patient as well as her daughter Deneise Lever whom she gave me permission to speak to.  She needs to be evaluated by psychiatry today in the emergency room.  I am concerned about her safety.  Her daughter voiced understanding and will be taking her to the emergency room.  She was last on venlafaxine I did not start anything is where taking her directly over.  She may need inpatient psychotherapy will defer to the act team.

## 2019-07-16 ENCOUNTER — Telehealth (HOSPITAL_COMMUNITY): Payer: Self-pay | Admitting: Emergency Medicine

## 2019-07-16 MED ORDER — SULFAMETHOXAZOLE-TRIMETHOPRIM 800-160 MG PO TABS: 1.0000 | ORAL_TABLET | Freq: Two times a day (BID) | ORAL | 0 refills | Status: AC

## 2019-07-16 NOTE — Telephone Encounter (Signed)
Patient contacted and made aware of  Resistance culture  results, all questions answered Will send bactrim to pharmacy.

## 2019-07-17 ENCOUNTER — Telehealth: Payer: Self-pay | Admitting: Family Medicine

## 2019-07-17 NOTE — Telephone Encounter (Signed)
I spoke with patient again on Saturday evening I called patient as well as her daughter Reggy Eye then I called her other daughter who finally was able to get her on a freeway call.  She states that she did not want a go to the emergency room patient want to talk to her therapist first the therapist the only one that knows her.  She does continue to admit to auditory hallucinations and some suicidal ideation both daughters are aware.  She states that she has no plan to hurt herself.  I urged him again to take her to the emergency room so that she can be evaluated and get established with psychiatry she told me that she was going to wait until she talk to her therapist which also advised is not likely to be until today because of the holiday  I am going to go ahead and set up a referral for psychiatry as an outpatient.  I do not feel comfortable putting her on medications without knowing her true underlying psychiatric diagnosis.

## 2019-07-17 NOTE — Addendum Note (Signed)
Addended by: Vic Blackbird F on: 07/17/2019 08:01 AM   Modules accepted: Orders

## 2019-08-01 ENCOUNTER — Ambulatory Visit (INDEPENDENT_AMBULATORY_CARE_PROVIDER_SITE_OTHER): Payer: Medicare Other | Admitting: Psychiatry

## 2019-08-01 ENCOUNTER — Other Ambulatory Visit: Payer: Self-pay

## 2019-08-01 DIAGNOSIS — F4310 Post-traumatic stress disorder, unspecified: Secondary | ICD-10-CM
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
## 2019-08-08 ENCOUNTER — Other Ambulatory Visit (HOSPITAL_COMMUNITY): Payer: Self-pay

## 2019-08-08 MED ORDER — PRAZOSIN HCL 1 MG PO CAPS: 1.0000 mg | ORAL_CAPSULE | Freq: Two times a day (BID) | ORAL | 0 refills | Status: DC

## 2019-08-08 MED ORDER — CLONAZEPAM 0.5 MG PO TABS: ORAL_TABLET | ORAL | 0 refills | Status: DC

## 2019-08-08 MED ORDER — VENLAFAXINE HCL 75 MG PO TABS: ORAL_TABLET | ORAL | 0 refills | Status: DC

## 2019-09-04 ENCOUNTER — Other Ambulatory Visit: Payer: Self-pay

## 2019-09-05 ENCOUNTER — Ambulatory Visit (INDEPENDENT_AMBULATORY_CARE_PROVIDER_SITE_OTHER): Payer: Medicare Other | Admitting: Family Medicine

## 2019-09-05 ENCOUNTER — Encounter: Payer: Self-pay | Admitting: Family Medicine

## 2019-09-05 VITALS — BP 124/66 | HR 78 | Temp 97.9°F | Resp 14 | Ht 62.0 in | Wt 253.0 lb

## 2019-09-05 DIAGNOSIS — Z6841 Body Mass Index (BMI) 40.0 and over, adult: Secondary | ICD-10-CM

## 2019-09-05 DIAGNOSIS — I10 Essential (primary) hypertension: Secondary | ICD-10-CM

## 2019-09-05 DIAGNOSIS — M25562 Pain in left knee: Secondary | ICD-10-CM

## 2019-09-05 DIAGNOSIS — E079 Disorder of thyroid, unspecified: Secondary | ICD-10-CM

## 2019-09-05 DIAGNOSIS — R7303 Prediabetes: Secondary | ICD-10-CM | POA: Diagnosis not present

## 2019-09-05 DIAGNOSIS — G8929 Other chronic pain: Secondary | ICD-10-CM

## 2019-09-05 DIAGNOSIS — R300 Dysuria: Secondary | ICD-10-CM

## 2019-09-05 LAB — URINALYSIS, ROUTINE W REFLEX MICROSCOPIC
Bilirubin Urine: NEGATIVE
Glucose, UA: NEGATIVE
Hgb urine dipstick: NEGATIVE
Ketones, ur: NEGATIVE
Leukocytes,Ua: NEGATIVE
Nitrite: NEGATIVE
Protein, ur: NEGATIVE
Specific Gravity, Urine: 1.02 (ref 1.001–1.03)
pH: 7 (ref 5.0–8.0)

## 2019-09-05 NOTE — Patient Instructions (Addendum)
Get xray  [facility]  Take tylenol for knee pain if needed  We will call with lab results  F/U  4  months for Physical

## 2019-09-05 NOTE — Progress Notes (Signed)
Subjective:    Patient ID: Janice Roberts, female    DOB: Dec 27, 1975, 43 y.o.   MRN: [id number]  Patient presents for Medication Review/ Refill (is fasting), Lower Abd Pain (sharp pain in L side of abd- occurs after emptying bladder), and L Knee Pain (x months- pain noted if she sits for long periods of time)   Pt here with left knee pain for the past 2 months, has some pain anteriorly, but it gives on on her at times. No swelling   No meds taken    She gets pain in lower abdomen for the past 2-3 weeks, worse after she urinates. No vaginal discharge, no vaginal bleeding. No change in bowels, No vomiting    No meds taken.  S/p hysterectomy   Note treated for cystitis in Sept by UC      She recently established with psychiatry, now on minipress, klonopin, effexor    HTN- taking Norvasc as prescrib ed   Borderline DM- last A1C 6.1%   hypothyrpidism- taking 138mcg once a day , due for repeat labs     Review Of Systems:  GEN- denies fatigue, fever, weight loss,weakness, recent illness HEENT- denies eye drainage, change in vision, nasal discharge, CVS- denies chest pain, palpitations RESP- denies SOB, cough, wheeze ABD- denies N/V, change in stools, abd pain GU- denies dysuria, hematuria, dribbling, incontinence MSK- +joint pain, muscle aches, injury Neuro- denies headache, dizziness, syncope, seizure activity       Objective:    BP 124/66   Pulse 78   Temp 97.9 F (36.6 C) (Temporal)   Resp 14   Ht 5\' 2"  (1.575 m)   Wt 253 lb (114.8 kg)   LMP 09/30/2017 (Within Days)   SpO2 97%   BMI 46.27 kg/m  GEN- NAD, alert and oriented x3 HEENT- PERRL, EOMI, non injected sclera, pink conjunctiva, MMM, oropharynx clear Neck- Supple, no thyromegaly CVS- RRR, no murmur RESP-CTAB ABD-NABS,soft,NT,ND, no CVA tenderness MSK- bilat knee no aaparent effusion, large knee, TTP left ant knee, no crepitus, normal gait, FROM ankle EXT- No edema Pulses- Radial,  2+         Assessment & Plan:      Problem List Items Addressed This Visit      Unprioritized   Borderline diabetic   Relevant Orders   Hemoglobin A1c   Hypertension    Blood pressure is controlled.  No change her medication.  I will check her renal function her lipid panel.  She is also borderline diabetic so recheck the A1c we did discuss dietary changes need for weight loss which will help with her blood pressure and her blood sugar.  Regarding the abdominal discomfort she was treated for UTI back in September urinalysis today is clear.  She does not have any bowel changes exam is fairly benign.  And she is not having any vaginitis symptoms.  I will send her urine for culture would not treat anything particularly at this time.  Regarding her chronic knee pain will obtain an x-ray of the knee is back with her have that she may have some deterioration of the joint.  She can use acetaminophen topical antibiotic ointment as needed she does not have daily pain.      Relevant Orders   CBC with Differential/Platelet   Comprehensive metabolic panel   Lipid panel   Obesity   Thyroid disease - Primary   Relevant Orders   TSH    Other Visit Diagnoses    Dysuria  Relevant Orders   Urinalysis, Routine w reflex microscopic (Completed)   Urine Culture   Chronic pain of left knee       Relevant Orders   DG Knee Complete 4 Views Left      Note: This dictation was prepared with Dragon dictation along with smaller phrase technology. Any transcriptional errors that result from this process are unintentional.

## 2019-09-05 NOTE — Assessment & Plan Note (Signed)
Blood pressure is controlled.  No change her medication.  I will check her renal function her lipid panel.  She is also borderline diabetic so recheck the A1c we did discuss dietary changes need for weight loss which will help with her blood pressure and her blood sugar.  Regarding the abdominal discomfort she was treated for UTI back in September urinalysis today is clear.  She does not have any bowel changes exam is fairly benign.  And she is not having any vaginitis symptoms.  I will send her urine for culture would not treat anything particularly at this time.  Regarding her chronic knee pain will obtain an x-ray of the knee is back with her have that she may have some deterioration of the joint.  She can use acetaminophen topical antibiotic ointment as needed she does not have daily pain.

## 2019-09-06 LAB — COMPREHENSIVE METABOLIC PANEL
AG Ratio: 1.4 (calc) (ref 1.0–2.5)
ALT: 9 U/L (ref 6–29)
AST: 11 U/L (ref 10–30)
Albumin: 4 g/dL (ref 3.6–5.1)
Alkaline phosphatase (APISO): 72 U/L (ref 31–125)
BUN: 9 mg/dL (ref 7–25)
CO2: 27 mmol/L (ref 20–32)
Calcium: 9.3 mg/dL (ref 8.6–10.2)
Chloride: 106 mmol/L (ref 98–110)
Creat: 0.83 mg/dL (ref 0.50–1.10)
Globulin: 2.9 g/dL (calc) (ref 1.9–3.7)
Glucose, Bld: 101 mg/dL — ABNORMAL HIGH (ref 65–99)
Potassium: 4 mmol/L (ref 3.5–5.3)
Sodium: 140 mmol/L (ref 135–146)
Total Bilirubin: 0.7 mg/dL (ref 0.2–1.2)
Total Protein: 6.9 g/dL (ref 6.1–8.1)

## 2019-09-06 LAB — CBC WITH DIFFERENTIAL/PLATELET
Absolute Monocytes: 330 cells/uL (ref 200–950)
Basophils Absolute: 20 cells/uL (ref 0–200)
Basophils Relative: 0.4 %
Eosinophils Absolute: 40 cells/uL (ref 15–500)
Eosinophils Relative: 0.8 %
HCT: 38.2 % (ref 35.0–45.0)
Hemoglobin: 12.5 g/dL (ref 11.7–15.5)
Lymphs Abs: 2625 cells/uL (ref 850–3900)
MCH: 28 pg (ref 27.0–33.0)
MCHC: 32.7 g/dL (ref 32.0–36.0)
MCV: 85.5 fL (ref 80.0–100.0)
MPV: 10.8 fL (ref 7.5–12.5)
Monocytes Relative: 6.6 %
Neutro Abs: 1985 cells/uL (ref 1500–7800)
Neutrophils Relative %: 39.7 %
Platelets: 247 10*3/uL (ref 140–400)
RBC: 4.47 10*6/uL (ref 3.80–5.10)
RDW: 14 % (ref 11.0–15.0)
Total Lymphocyte: 52.5 %
WBC: 5 10*3/uL (ref 3.8–10.8)

## 2019-09-06 LAB — LIPID PANEL
Cholesterol: 221 mg/dL — ABNORMAL HIGH (ref <200)
HDL: 69 mg/dL (ref >=50)
LDL Cholesterol (Calc): 136 mg/dL (calc) — ABNORMAL HIGH
Non-HDL Cholesterol (Calc): 152 mg/dL (calc) — ABNORMAL HIGH (ref <130)
Total CHOL/HDL Ratio: 3.2 (calc) (ref <5.0)
Triglycerides: 72 mg/dL (ref <150)

## 2019-09-06 LAB — HEMOGLOBIN A1C
Hgb A1c MFr Bld: 6 % of total Hgb — ABNORMAL HIGH (ref <5.7)
Mean Plasma Glucose: 126 (calc)
eAG (mmol/L): 7 (calc)

## 2019-09-06 LAB — TSH: TSH: 0.38 mIU/L — ABNORMAL LOW

## 2019-09-07 LAB — URINE CULTURE
MICRO NUMBER:: 1040023
SPECIMEN QUALITY:: ADEQUATE

## 2019-09-12 ENCOUNTER — Other Ambulatory Visit: Payer: Self-pay

## 2019-09-12 ENCOUNTER — Ambulatory Visit (INDEPENDENT_AMBULATORY_CARE_PROVIDER_SITE_OTHER): Payer: Medicare Other | Admitting: Psychiatry

## 2019-09-12 DIAGNOSIS — F4310 Post-traumatic stress disorder, unspecified: Secondary | ICD-10-CM

## 2019-09-12 MED ORDER — PRAZOSIN HCL 1 MG PO CAPS: 1.0000 mg | ORAL_CAPSULE | Freq: Two times a day (BID) | ORAL | 0 refills | Status: DC

## 2019-09-12 MED ORDER — CLONAZEPAM 1 MG PO TABS: 1.0000 mg | ORAL_TABLET | Freq: Three times a day (TID) | ORAL | 2 refills | Status: DC | PRN

## 2019-09-12 MED ORDER — VENLAFAXINE HCL ER 75 MG PO CP24: 225.0000 mg | ORAL_CAPSULE | Freq: Every day | ORAL | 2 refills | Status: DC
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
## 2019-09-13 ENCOUNTER — Other Ambulatory Visit: Payer: Self-pay | Admitting: *Deleted

## 2019-09-13 DIAGNOSIS — E079 Disorder of thyroid, unspecified: Secondary | ICD-10-CM

## 2019-09-13 MED ORDER — LEVOTHYROXINE SODIUM 100 MCG PO TABS: 100.0000 ug | ORAL_TABLET | Freq: Every day | ORAL | 3 refills | Status: DC

## 2019-09-14 ENCOUNTER — Ambulatory Visit
Admission: RE | Admit: 2019-09-14 | Discharge: 2019-09-14 | Disposition: A | Discharge status: Home / Self Care | Payer: Medicare Other | Source: Ambulatory Visit | Attending: Family Medicine | Admitting: Family Medicine

## 2019-09-14 DIAGNOSIS — G8929 Other chronic pain: Secondary | ICD-10-CM

## 2019-10-12 ENCOUNTER — Other Ambulatory Visit: Payer: Self-pay | Admitting: Cardiology

## 2019-10-12 DIAGNOSIS — Z20822 Contact with and (suspected) exposure to covid-19: Secondary | ICD-10-CM

## 2019-10-15 ENCOUNTER — Telehealth: Payer: Self-pay | Admitting: Critical Care Medicine

## 2019-10-15 LAB — NOVEL CORONAVIRUS, NAA: SARS-CoV-2, NAA: DETECTED — AB

## 2019-10-15 NOTE — Telephone Encounter (Signed)
I connected this patient who was tested December 4 for Covid and is positive.  The patient states she has been asymptomatic.  Requested she stay in isolation until 15 December.

## 2019-10-16 ENCOUNTER — Telehealth: Payer: Self-pay | Admitting: Nurse Practitioner

## 2019-10-16 NOTE — Telephone Encounter (Signed)
Called to Discuss with patient about Covid symptoms and the use of bamlanivimab, a monoclonal antibody infusion for those with mild to moderate Covid symptoms and at a high risk of hospitalization.     Pt is qualified for this infusion at the [facility] infusion center due to co-morbid conditions and/or a member of an at-risk group.    Patient's BMI is over 35.   Unable to reach pt

## 2019-11-30 ENCOUNTER — Encounter: Payer: Self-pay | Admitting: Family Medicine

## 2019-11-30 ENCOUNTER — Ambulatory Visit (INDEPENDENT_AMBULATORY_CARE_PROVIDER_SITE_OTHER): Payer: Medicare HMO | Admitting: Family Medicine

## 2019-11-30 ENCOUNTER — Other Ambulatory Visit: Payer: Self-pay

## 2019-11-30 DIAGNOSIS — J302 Other seasonal allergic rhinitis: Secondary | ICD-10-CM

## 2019-11-30 MED ORDER — FLUTICASONE PROPIONATE 50 MCG/ACT NA SUSP: 1.0000 | Freq: Two times a day (BID) | NASAL | 2 refills | Status: DC | PRN

## 2019-11-30 MED ORDER — CETIRIZINE HCL 10 MG PO TABS: 10.0000 mg | ORAL_TABLET | Freq: Every day | ORAL | 11 refills | Status: DC

## 2019-11-30 MED ORDER — SALINE SPRAY 0.65 % NA SOLN: 1.0000 | NASAL | 0 refills | Status: DC | PRN

## 2019-11-30 NOTE — Progress Notes (Signed)
Virtual Visit via Telephone Note  I connected with Janice Roberts on 11/30/19 at 2:52pm  by telephone and verified that I am speaking with the correct person using two identifiers.      Pt location: at home   Physician location:  In office, [facility], Vic Blackbird MD     On call: patient and physician   I discussed the limitations, risks, security and privacy concerns of performing an evaluation and management service by telephone and the availability of in person appointments. I also discussed with the patient that there may be a patient responsible charge related to this service. The patient expressed understanding and agreed to proceed.   History of Present Illness:  For the past month has had nasal congestion. She is able to blow her nose, just has dried up discharge and gets blood out of both sides. No facial pain. No fever, no cough, no sore throat, no post nasal drip  She has had some watery eyes and had eye irritation. She has been using  Prescribed eye drops(  Pazeo ) and Restasis drops for dry eye  She has not taken any allergy pills or sudafed Used vaseline and cocoa butter in her nose   No known sick contacts   Note  She is not taking effexor  Or Minipress prescribed by her psychiatrist  Observations/Objective: NAD noted over phone , speaking in full sentences   Assessment and Plan: Seasonal allergies with nasal congestion- will give zyrtec, flonase, nasal saline rinse Continue eye drops per eye doctor   Follow Up Instructions: f/u as needed     I discussed the assessment and treatment plan with the patient. The patient was provided an opportunity to ask questions and all were answered. The patient agreed with the plan and demonstrated an understanding of the instructions.   The patient was advised to call back or seek an in-person evaluation if the symptoms worsen or if the condition fails to improve as anticipated.  I provided 5 minutes of  non-face-to-face time during this encounter. End Time:  2:57pm  Vic Blackbird, MD

## 2019-12-03 ENCOUNTER — Other Ambulatory Visit: Payer: Self-pay | Admitting: Family Medicine

## 2019-12-03 DIAGNOSIS — I10 Essential (primary) hypertension: Secondary | ICD-10-CM

## 2019-12-11 ENCOUNTER — Other Ambulatory Visit: Payer: Self-pay

## 2019-12-11 DIAGNOSIS — I10 Essential (primary) hypertension: Secondary | ICD-10-CM

## 2019-12-11 MED ORDER — BENAZEPRIL HCL 20 MG PO TABS: 20.0000 mg | ORAL_TABLET | Freq: Every day | ORAL | 0 refills | Status: DC

## 2019-12-13 ENCOUNTER — Other Ambulatory Visit: Payer: Self-pay

## 2019-12-13 ENCOUNTER — Ambulatory Visit (INDEPENDENT_AMBULATORY_CARE_PROVIDER_SITE_OTHER): Payer: Medicare HMO | Admitting: Psychiatry

## 2019-12-13 DIAGNOSIS — F410 Panic disorder [episodic paroxysmal anxiety] without agoraphobia: Secondary | ICD-10-CM | POA: Diagnosis not present

## 2019-12-13 DIAGNOSIS — F4310 Post-traumatic stress disorder, unspecified: Secondary | ICD-10-CM

## 2019-12-13 MED ORDER — CLONAZEPAM 1 MG PO TABS: 1.0000 mg | ORAL_TABLET | Freq: Three times a day (TID) | ORAL | 2 refills | Status: DC | PRN

## 2019-12-13 MED ORDER — PRAZOSIN HCL 2 MG PO CAPS: 2.0000 mg | ORAL_CAPSULE | Freq: Two times a day (BID) | ORAL | 0 refills | Status: DC

## 2019-12-13 MED ORDER — VENLAFAXINE HCL ER 75 MG PO CP24: 225.0000 mg | ORAL_CAPSULE | Freq: Every day | ORAL | 2 refills | Status: DC
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
## 2019-12-22 ENCOUNTER — Ambulatory Visit
Admission: EM | Admit: 2019-12-22 | Discharge: 2019-12-22 | Disposition: A | Discharge status: Home / Self Care | Payer: Medicare HMO | Attending: Emergency Medicine | Admitting: Emergency Medicine

## 2019-12-22 ENCOUNTER — Other Ambulatory Visit: Payer: Self-pay

## 2019-12-22 DIAGNOSIS — R208 Other disturbances of skin sensation: Secondary | ICD-10-CM | POA: Diagnosis not present

## 2019-12-22 DIAGNOSIS — M7989 Other specified soft tissue disorders: Secondary | ICD-10-CM | POA: Diagnosis not present

## 2019-12-22 MED ORDER — PREDNISONE 10 MG (21) PO TBPK: ORAL_TABLET | Freq: Every day | ORAL | 0 refills | Status: DC

## 2019-12-22 NOTE — ED Provider Notes (Signed)
[facility]    CSN: [id number] Arrival date & time: 12/22/19  1509      History   Chief Complaint Chief Complaint  Patient presents with  . Hand Problem    bilateral burning sensation    HPI Janice Roberts is a 44 y.o. female with history of hypothyroidism, obesity, hypertension presenting for bilateral hand itching, burning the last week.  States it feels that this cracks between her fingers, though she cannot appreciate any.  Noted some hand swelling for the last couple days.  Denies decreased sensation, though does have difficulty gripping items firmly due to a stretching feeling.  Patient has tried Benadryl without significant relief.  Patient denies history of nephropathy: Renal function unremarkable last check in October 2020.  No change to bowel or bladder habit.  Denies difficulty breathing, wheezing, cough, chest pain.  Past Medical History:  Diagnosis Date  . Anxiety    no meds  . Asthma    rarely uses inhaler - seasonal with allergies  . Depression    no med  . Hypertension   . Hypothyroidism   . Obesity   . Seasonal allergies   . SVD (spontaneous vaginal delivery) 1997   x   . Thyroid disease     Patient Active Problem List   Diagnosis Date Noted  . Panic disorder 12/13/2019  . Seasonal allergies 11/30/2019  . Borderline diabetic 09/05/2019  . PTSD (post-traumatic stress disorder) 07/13/2019  . Endometriosis determined by laparoscopy 02/11/2017  . PID (acute pelvic inflammatory disease) 11/17/2016  . Pelvic pain 11/15/2016  . Family history of breast cancer in mother 10/21/2016  . Pelvic pain in female 11/10/2015  . Anxiety   . Depression   . Asthma   . Obesity   . Hypertension   . Thyroid disease     Past Surgical History:  Procedure Laterality Date  . ABDOMINAL HYSTERECTOMY    . APPENDECTOMY    . [place]   x   . [place] OF UTERUS  08/08/2008 & 03/08/2013   2009 missed ab, 2014 bleeding  .  LAPAROSCOPIC VAGINAL HYSTERECTOMY WITH SALPINGO OOPHORECTOMY Bilateral 12/06/2017   Procedure: LAPAROSCOPIC ASSISTED VAGINAL HYSTERECTOMY WITH SALPINGO OOPHORECTOMY;  Surgeon: Woodroe Mode, MD;  Location: [facility];  Service: Gynecology;  Laterality: Bilateral;  . LAPAROSCOPY N/A 01/24/2017   Procedure: LAPAROSCOPY DIAGNOSTIC;  Surgeon: Woodroe Mode, MD;  Location: [facility];  Service: Gynecology;  Laterality: N/A;  . SHOULDER SURGERY Left   . THYROIDECTOMY    . WISDOM TOOTH EXTRACTION      OB History    Gravida  10   Para  2   Term  2   Preterm  0   AB  8   Living  2     SAB  1   TAB  7   Ectopic      Multiple      Live Births  2            Home Medications    Prior to Admission medications   Medication Sig Start Date End Date Taking? Authorizing Provider  Albuterol Sulfate (PROAIR RESPICLICK) 123XX123 (90 Base) MCG/ACT AEPB Inhale 2 puffs into the lungs every 6 (six) hours. As needed for wheezing or shortness of breath 06/13/18   Dena Billet B, PA-C  amLODipine (NORVASC) 5 MG tablet Take 1 tablet (5 mg total) by mouth daily. 01/24/19   Delsa Grana, PA-C  benazepril (LOTENSIN) 20 MG  tablet Take 1 tablet (20 mg total) by mouth daily. 12/11/19   Gilbert, Modena Nunnery, MD  clonazePAM (KLONOPIN) 1 MG tablet Take 1 tablet (1 mg total) by mouth 3 (three) times daily as needed for anxiety (sleep). 12/13/19 03/12/20  Pucilowski, Marchia Bond, MD  fluticasone (FLONASE) 50 MCG/ACT nasal spray Place 1 spray into both nostrils 2 (two) times daily as needed for allergies or rhinitis. 11/30/19   Alycia Rossetti, MD  levothyroxine (SYNTHROID) 100 MCG tablet Take 1 tablet (100 mcg total) by mouth daily. 09/13/19   , Modena Nunnery, MD  prazosin (MINIPRESS) 2 MG capsule Take 1 capsule (2 mg total) by mouth 2 (two) times daily. 12/13/19 03/12/20  Pucilowski, Marchia Bond, MD  predniSONE (STERAPRED UNI-PAK 21 TAB) 10 MG (21) TBPK tablet Take by mouth daily. Take steroid taper as written 12/22/19   Hall-Potvin,  [place], PA-C  sodium chloride (OCEAN) 0.65 % SOLN nasal spray Place 1 spray into both nostrils as needed for congestion. 11/30/19   Alycia Rossetti, MD  venlafaxine XR (EFFEXOR-XR) 75 MG 24 hr capsule Take 3 capsules (225 mg total) by mouth daily with breakfast. 12/13/19 03/12/20  Pucilowski, Marchia Bond, MD  pantoprazole (PROTONIX) 40 MG tablet Take 1 tablet (40 mg total) by mouth at bedtime. 01/24/19 07/11/19  Delsa Grana, PA-C    Family History Family History  Problem Relation Age of Onset  . Cancer Mother 30       Breast  . Depression Mother   . Hearing loss Mother   . Hypertension Mother   . Stroke Mother   . Asthma Brother   . Birth defects Sister     Social History Social History   Tobacco Use  . Smoking status: Never Smoker  . Smokeless tobacco: Never Used  Substance Use Topics  . Alcohol use: No  . Drug use: No     Allergies   Ivp dye [iodinated diagnostic agents], Oxycodone, Penicillins, and Percocet [oxycodone-acetaminophen]   Review of Systems As per HPI   Physical Exam Triage Vital Signs ED Triage Vitals  Enc Vitals Group     BP      Pulse      Resp      Temp      Temp src      SpO2      Weight      Height      Head Circumference      Peak Flow      Pain Score      Pain Loc      Pain Edu?      Excl. in [place]?    No data found.  Updated Vital Signs BP 131/89 (BP Location: Left Arm)   Pulse 88   Temp 98 F (36.7 C) (Oral)   Resp 16   LMP 09/30/2017 (Within Days)   SpO2 96%   Visual Acuity Right Eye Distance:   Left Eye Distance:   Bilateral Distance:    Right Eye Near:   Left Eye Near:    Bilateral Near:     Physical Exam Constitutional:      General: She is not in acute distress.    Appearance: She is obese. She is not ill-appearing or diaphoretic.  HENT:     Head: Normocephalic and atraumatic.     Mouth/Throat:     Mouth: Mucous membranes are moist.     Pharynx: Oropharynx is clear.  Eyes:     General: No scleral icterus.  Pupils: Pupils are equal, round, and reactive to light.  Cardiovascular:     Rate and Rhythm: Normal rate.     Comments: Radial pulses 2+ bilaterally/symmetric Pulmonary:     Effort: Pulmonary effort is normal. No respiratory distress.     Breath sounds: No wheezing.  Musculoskeletal:     Cervical back: Normal range of motion and neck supple. No rigidity or tenderness.     Comments: Full active ROM of shoulders, elbows, and wrists bilaterally.  Decreased ROM of fingers with flexion second to tightness sensation.  Strength deferred.  Negative Tinel's, Phalen's.  Mild edema in hands, fingers without pitting  Lymphadenopathy:     Cervical: No cervical adenopathy.  Skin:    General: Skin is warm.     Capillary Refill: Capillary refill takes less than 2 seconds.     Coloration: Skin is not jaundiced or pale.     Findings: No erythema or rash.  Neurological:     Mental Status: She is alert and oriented to person, place, and time.     Sensory: No sensory deficit.     Deep Tendon Reflexes: Reflexes normal.      UC Treatments / Results  Labs (all labs ordered are listed, but only abnormal results are displayed) Labs Reviewed - No data to display  EKG   Radiology No results found.  Procedures Procedures (including critical care time)  Medications Ordered in UC Medications - No data to display  Initial Impression / Assessment and Plan / UC Course  I have reviewed the triage vital signs and the nursing notes.  Pertinent labs & imaging results that were available during my care of the patient were reviewed by me and considered in my medical decision making (see chart for details).     Patient is afebrile, nontoxic in office today.  Exam unremarkable except for subjective mild edema in hands and fingers bilaterally noted by this provider as compared to previous assessment.  No pitting edema or neurovascular deficit.  Low concern for acute nephrotic/nephritic process given lack of  systemic symptoms, urinary symptoms/history.  Will trial prednisone and have patient follow-up closely with PCP on Monday.  Return precautions discussed, patient verbalized understanding and is agreeable to plan. Final Clinical Impressions(s) / UC Diagnoses   Final diagnoses:  Burning sensation  Bilateral hand swelling     Discharge Instructions     Take steroid as prescribed (6-5-4-3-2-1).  Important to avoid hot water/showers as this can further dry skin and irritate. Apply lotions without Vaseline to help moisturize skin. Return for worsening rash, burning, joint pain, swelling.    ED Prescriptions    Medication Sig Dispense Auth. Provider   predniSONE (STERAPRED UNI-PAK 21 TAB) 10 MG (21) TBPK tablet Take by mouth daily. Take steroid taper as written 21 tablet Hall-Potvin, [place], PA-C     PDMP not reviewed this encounter.   Hall-Potvin, [place], [place] 12/22/19 1548

## 2019-12-22 NOTE — Discharge Instructions (Addendum)
Take steroid as prescribed (6-5-4-3-2-1).  Important to avoid hot water/showers as this can further dry skin and irritate. Apply lotions without Vaseline to help moisturize skin. Return for worsening rash, burning, joint pain, swelling, your urine becomes dark like cola or has blood in it.

## 2019-12-22 NOTE — ED Triage Notes (Signed)
Patient presents with bilateral burning sensation in her hands that she reports started one week ago.  She notes at times her hands have severe itching and burning worsens after she washes her hands or applies lotion.  She denies contact with any chemicals.

## 2020-01-09 ENCOUNTER — Other Ambulatory Visit: Payer: Self-pay

## 2020-01-09 ENCOUNTER — Encounter: Payer: Self-pay | Admitting: Family Medicine

## 2020-01-09 ENCOUNTER — Ambulatory Visit (INDEPENDENT_AMBULATORY_CARE_PROVIDER_SITE_OTHER): Payer: Medicare HMO | Admitting: Family Medicine

## 2020-01-09 VITALS — BP 132/74 | HR 66 | Temp 97.9°F | Resp 14 | Ht 62.0 in | Wt 249.0 lb

## 2020-01-09 DIAGNOSIS — E079 Disorder of thyroid, unspecified: Secondary | ICD-10-CM

## 2020-01-09 DIAGNOSIS — F32A Depression, unspecified: Secondary | ICD-10-CM

## 2020-01-09 DIAGNOSIS — N760 Acute vaginitis: Secondary | ICD-10-CM | POA: Diagnosis not present

## 2020-01-09 DIAGNOSIS — R7303 Prediabetes: Secondary | ICD-10-CM

## 2020-01-09 DIAGNOSIS — Z0000 Encounter for general adult medical examination without abnormal findings: Secondary | ICD-10-CM

## 2020-01-09 DIAGNOSIS — F329 Major depressive disorder, single episode, unspecified: Secondary | ICD-10-CM | POA: Diagnosis not present

## 2020-01-09 DIAGNOSIS — B9689 Other specified bacterial agents as the cause of diseases classified elsewhere: Secondary | ICD-10-CM

## 2020-01-09 DIAGNOSIS — G4719 Other hypersomnia: Secondary | ICD-10-CM

## 2020-01-09 DIAGNOSIS — Z1231 Encounter for screening mammogram for malignant neoplasm of breast: Secondary | ICD-10-CM | POA: Diagnosis not present

## 2020-01-09 DIAGNOSIS — I10 Essential (primary) hypertension: Secondary | ICD-10-CM

## 2020-01-09 DIAGNOSIS — N898 Other specified noninflammatory disorders of vagina: Secondary | ICD-10-CM | POA: Diagnosis not present

## 2020-01-09 DIAGNOSIS — F4310 Post-traumatic stress disorder, unspecified: Secondary | ICD-10-CM

## 2020-01-09 LAB — WET PREP FOR TRICH, YEAST, CLUE

## 2020-01-09 MED ORDER — FLUCONAZOLE 150 MG PO TABS: 150.0000 mg | ORAL_TABLET | Freq: Once | ORAL | 0 refills | Status: AC

## 2020-01-09 MED ORDER — METRONIDAZOLE 500 MG PO TABS: 500.0000 mg | ORAL_TABLET | Freq: Two times a day (BID) | ORAL | 0 refills | Status: DC

## 2020-01-09 NOTE — Progress Notes (Signed)
Subjective:   Patient presents for Medicare Annual/Subsequent preventive examination.    Pt here for Physical      Has vaginal irritation, no vaginal discharge, feels irritated from the soap that she was using.  Denies any vaginal bleeding.  She feels she may have another bacterial vaginosis infection  No UTI symptoms  bowels are moving     Hysteroctomy due to endometriosis- no PAP Smear needed     Due for Mammogram     No family history of colon cancer     Immunizations- TDAP UTD, declines flu shot     Hypothyroidism- taking levotyroxine 147mcg once a day  se is fatigued a lot but doesn't sleep well despite taking ambien, she has no energy during the day. Yesterday felt nauseated, but did not vomit, then she feel asleep nd when she wke up felt better.  She has had sleep issues for years despite the different medications her psychiatrist has put her on.  She is interested in getting a sleep study.  She is always fatigued during the daytime as well.  Of note she was seen by her psychiatrist last month.  Her Effexor was increased she was continued on all her other medications.  She also continues with her psychotherapist      HTN- norvasc/ benezparil taking without dificulty, bp has been controlled    Borderline DM- last A1C 6%   Review Past Medical/Family/Social: Per EMR    Risk Factors  Current exercise habits: Occasional Dietary issues discussed: Yes  Cardiac risk factors: Obesity (BMI >= 30 kg/m2).  Borderline diabetes hypertension     Activities of Daily Living  In your present state of health, do you have any difficulty performing the following activities?:  Driving? No  Managing money? No  Feeding yourself? No  Getting from bed to chair? No  Climbing a flight of stairs? No  Preparing food and eating?: No  Bathing or showering? No  Getting dressed: No  Getting to the toilet? No  Using the toilet:No  Moving around from place to place: No  In the past year have you  fallen or had a near fall?:No  Are you sexually active? No  Do you have more than one partner? No   Hearing Difficulties: No  Do you often ask people to speak up or repeat themselves? No  Do you experience ringing or noises in your ears? No Do you have difficulty understanding soft or whispered voices? No  Do you feel that you have a problem with memory? No Do you often misplace items? No  Do you feel safe at home? Yes  Cognitive Testing  Alert? Yes Normal Appearance?Yes  Oriented to person? Yes Place? Yes  Time? Yes  Recall of three objects? Yes  Can perform simple calculations? Yes  Displays appropriate judgment?Yes  Can read the correct time from a watch face?Yes   List the Names of Other Physician/Practitioners you currently use:  Eye doctor- [facility] every 6 months    Screening Tests / Date                     Mammogram  Due  Influenza Vaccine  Declines  Tetanus/tdap UTD   RTOS:  GEN- denies fatigue, fever, weight loss,weakness, recent illness HEENT- denies eye drainage, change in vision, nasal discharge, CVS- denies chest pain, palpitations RESP- denies SOB, cough, wheeze ABD- denies N/V, change in stools, abd pain GU- denies dysuria, hematuria, dribbling, incontinence MSK- denies joint pain,  muscle aches, injury Neuro- denies headache, dizziness, syncope, seizure activity  Physical: VITALS REVIEWED  GEN- NAD, alert and oriented x3 HEENT- PERRL, EOMI, non injected sclera, pink conjunctiva, TM clear bilat no effusion  Neck- Supple, no thryomegaly CVS- RRR, no murmur RESP-CTAB ABD-NABS,soft, NT,ND GU- normal external genitalia, vs/p hysterctomy,  + white  discharge, no CMT EXT- No edema Pulses- Radial, DP- 2+   Assessment:    Annual wellness medicare exam   Plan:    During the course of the visit the patient was educated and counseled about appropriate screening and preventive services including:   CPE done.  Patient to schedule  mammogram.  Fasting labs obtained we will also check her A1c due to the borderline diabetes.  Fatigue is likely multifactorial but we will set her up for sleep study.  She is on multiple medications for her mental health problems.  Hypothyroidism recheck thyroid function studies  Bacterial vaginosis treated with Flagyl she will take Diflucan at the end of the antibiotics  Hypertension blood pressure is controlled  Class III obesity with discussed some dietary changes.  She does exercise some when she goes to work her weight is down 4 pounds           Diet review for nutrition referral? Yes ____ Not Indicated __x__  Patient Instructions (the written plan) was given to the patient.  Medicare Attestation  I have personally reviewed:  The patient's medical and social history  Their use of alcohol, tobacco or illicit drugs  Their current medications and supplements  The patient's functional ability including ADLs,fall risks, home safety risks, cognitive, and hearing and visual impairment  Diet and physical activities  Evidence for depression or mood disorders  The patient's weight, height, BMI, and visual acuity have been recorded in the chart. I have made referrals, counseling, and provided education to the patient based on review of the above and I have provided the patient with a written personalized care plan for preventive services.

## 2020-01-09 NOTE — Patient Instructions (Addendum)
Schedule your mammogram We will call with lab results  Take antibiotics, then take the yeast pill  Referal for sleep study  F/U 6 months

## 2020-01-10 ENCOUNTER — Other Ambulatory Visit: Payer: Self-pay | Admitting: *Deleted

## 2020-01-10 DIAGNOSIS — E079 Disorder of thyroid, unspecified: Secondary | ICD-10-CM

## 2020-01-10 LAB — COMPREHENSIVE METABOLIC PANEL
AG Ratio: 1.5 (calc) (ref 1.0–2.5)
ALT: 12 U/L (ref 6–29)
AST: 9 U/L — ABNORMAL LOW (ref 10–35)
Albumin: 4.1 g/dL (ref 3.6–5.1)
Alkaline phosphatase (APISO): 75 U/L (ref 31–125)
BUN: 13 mg/dL (ref 7–25)
CO2: 27 mmol/L (ref 20–32)
Calcium: 9.3 mg/dL (ref 8.6–10.2)
Chloride: 107 mmol/L (ref 98–110)
Creat: 0.8 mg/dL (ref 0.50–1.10)
Globulin: 2.8 g/dL (calc) (ref 1.9–3.7)
Glucose, Bld: 106 mg/dL — ABNORMAL HIGH (ref 65–99)
Potassium: 4 mmol/L (ref 3.5–5.3)
Sodium: 143 mmol/L (ref 135–146)
Total Bilirubin: 0.6 mg/dL (ref 0.2–1.2)
Total Protein: 6.9 g/dL (ref 6.1–8.1)

## 2020-01-10 LAB — CBC WITH DIFFERENTIAL/PLATELET
Absolute Monocytes: 440 cells/uL (ref 200–950)
Basophils Absolute: 28 cells/uL (ref 0–200)
Basophils Relative: 0.5 %
Eosinophils Absolute: 28 cells/uL (ref 15–500)
Eosinophils Relative: 0.5 %
HCT: 37.4 % (ref 35.0–45.0)
Hemoglobin: 12.3 g/dL (ref 11.7–15.5)
Lymphs Abs: 2723 cells/uL (ref 850–3900)
MCH: 28.7 pg (ref 27.0–33.0)
MCHC: 32.9 g/dL (ref 32.0–36.0)
MCV: 87.4 fL (ref 80.0–100.0)
MPV: 10.8 fL (ref 7.5–12.5)
Monocytes Relative: 8 %
Neutro Abs: 2283 cells/uL (ref 1500–7800)
Neutrophils Relative %: 41.5 %
Platelets: 240 10*3/uL (ref 140–400)
RBC: 4.28 10*6/uL (ref 3.80–5.10)
RDW: 14.8 % (ref 11.0–15.0)
Total Lymphocyte: 49.5 %
WBC: 5.5 10*3/uL (ref 3.8–10.8)

## 2020-01-10 LAB — T4, FREE: Free T4: 1 ng/dL (ref 0.8–1.8)

## 2020-01-10 LAB — TSH: TSH: 5.54 mIU/L — ABNORMAL HIGH

## 2020-01-10 LAB — LIPID PANEL
Cholesterol: 218 mg/dL — ABNORMAL HIGH (ref <200)
HDL: 69 mg/dL (ref >=50)
LDL Cholesterol (Calc): 131 mg/dL (calc) — ABNORMAL HIGH
Non-HDL Cholesterol (Calc): 149 mg/dL (calc) — ABNORMAL HIGH (ref <130)
Total CHOL/HDL Ratio: 3.2 (calc) (ref <5.0)
Triglycerides: 83 mg/dL (ref <150)

## 2020-01-10 LAB — HEMOGLOBIN A1C
Hgb A1c MFr Bld: 6.2 % of total Hgb — ABNORMAL HIGH (ref <5.7)
Mean Plasma Glucose: 131 (calc)
eAG (mmol/L): 7.3 (calc)

## 2020-01-10 MED ORDER — METFORMIN HCL 500 MG PO TABS: 500.0000 mg | ORAL_TABLET | Freq: Every day | ORAL | 0 refills | Status: DC

## 2020-01-10 MED ORDER — LEVOTHYROXINE SODIUM 112 MCG PO TABS: 112.0000 ug | ORAL_TABLET | Freq: Every day | ORAL | 0 refills | Status: DC

## 2020-01-24 ENCOUNTER — Ambulatory Visit (INDEPENDENT_AMBULATORY_CARE_PROVIDER_SITE_OTHER): Payer: Medicare HMO | Admitting: Nurse Practitioner

## 2020-01-24 ENCOUNTER — Other Ambulatory Visit: Payer: Self-pay

## 2020-01-24 VITALS — BP 140/88 | HR 76 | Temp 98.1°F | Resp 18 | Ht 62.0 in | Wt 247.8 lb

## 2020-01-24 DIAGNOSIS — R102 Pelvic and perineal pain: Secondary | ICD-10-CM

## 2020-01-24 DIAGNOSIS — Z90710 Acquired absence of both cervix and uterus: Secondary | ICD-10-CM

## 2020-01-24 DIAGNOSIS — N952 Postmenopausal atrophic vaginitis: Secondary | ICD-10-CM | POA: Diagnosis not present

## 2020-01-24 DIAGNOSIS — N898 Other specified noninflammatory disorders of vagina: Secondary | ICD-10-CM | POA: Diagnosis not present

## 2020-01-24 LAB — WET PREP FOR TRICH, YEAST, CLUE

## 2020-01-24 NOTE — Progress Notes (Signed)
Acute Office Visit  Subjective:    Patient ID: Janice Roberts, female    DOB: Jan 31, 1976, 44 y.o.   MRN: [id number]  Chief Complaint: pelvic discomfort with vaginal irritation with using Donnamarie Poag  HPI Patient is in today for return visit after being treated for BV for 10 day course of Flagyl after using Dove soap that irritates her vaginally. She reports that her sxs did not improve and have since continued. H/O total hysterectomy to which she has noticed that vaginal dryness has been a growing issue for her. She is sexually active last intercourse with partner was just before her sxs started.   Past Medical History:  Diagnosis Date  . Anxiety    no meds  . Asthma    rarely uses inhaler - seasonal with allergies  . Depression    no med  . Hypertension   . Hypothyroidism   . Obesity   . Seasonal allergies   . SVD (spontaneous vaginal delivery) 1997   x   . Thyroid disease     Past Surgical History:  Procedure Laterality Date  . ABDOMINAL HYSTERECTOMY    . APPENDECTOMY    . [place]   x   . [place] OF UTERUS  08/08/2008 & 03/08/2013   2009 missed ab, 2014 bleeding  . LAPAROSCOPIC VAGINAL HYSTERECTOMY WITH SALPINGO OOPHORECTOMY Bilateral 12/06/2017   Procedure: LAPAROSCOPIC ASSISTED VAGINAL HYSTERECTOMY WITH SALPINGO OOPHORECTOMY;  Surgeon: Woodroe Mode, MD;  Location: [facility];  Service: Gynecology;  Laterality: Bilateral;  . LAPAROSCOPY N/A 01/24/2017   Procedure: LAPAROSCOPY DIAGNOSTIC;  Surgeon: Woodroe Mode, MD;  Location: [facility];  Service: Gynecology;  Laterality: N/A;  . SHOULDER SURGERY Left   . THYROIDECTOMY    . WISDOM TOOTH EXTRACTION      Family History  Problem Relation Age of Onset  . Cancer Mother 69       Breast  . Depression Mother   . Hearing loss Mother   . Hypertension Mother   . Stroke Mother   . Asthma Brother   . Birth defects Sister     Social History   Socioeconomic History  . Marital status: Single   Spouse name: Not on file  . Number of children: Not on file  . Years of education: Not on file  . Highest education level: Not on file  Occupational History  . Not on file  Tobacco Use  . Smoking status: Never Smoker  . Smokeless tobacco: Never Used  Substance and Sexual Activity  . Alcohol use: No  . Drug use: No  . Sexual activity: Not Currently    Birth control/protection: None  Other Topics Concern  . Not on file  Social History Narrative  . Not on file   Social Determinants of Health   Financial Resource Strain:   . Difficulty of Paying Living Expenses:   Food Insecurity:   . Worried About Charity fundraiser in the Last Year:   . Arboriculturist in the Last Year:   Transportation Needs:   . Film/video editor (Medical):   Marland Kitchen Lack of Transportation (Non-Medical):   Physical Activity:   . Days of Exercise per Week:   . Minutes of Exercise per Session:   Stress:   . Feeling of Stress :   Social Connections:   . Frequency of Communication with Friends and Family:   . Frequency of Social Gatherings with Friends and Family:   .  Attends Religious Services:   . Active Member of Clubs or Organizations:   . Attends Archivist Meetings:   Marland Kitchen Marital Status:   Intimate Partner Violence:   . Fear of Current or Ex-Partner:   . Emotionally Abused:   Marland Kitchen Physically Abused:   . Sexually Abused:     Outpatient Medications Prior to Visit  Medication Sig Dispense Refill  . Albuterol Sulfate (PROAIR RESPICLICK) 123XX123 (90 Base) MCG/ACT AEPB Inhale 2 puffs into the lungs every 6 (six) hours. As needed for wheezing or shortness of breath 1 each 2  . amLODipine (NORVASC) 5 MG tablet Take 1 tablet (5 mg total) by mouth daily. 90 tablet 2  . benazepril (LOTENSIN) 20 MG tablet Take 1 tablet (20 mg total) by mouth daily. 90 tablet 0  . clonazePAM (KLONOPIN) 1 MG tablet Take 1 tablet (1 mg total) by mouth 3 (three) times daily as needed for anxiety (sleep). 90 tablet 2  .  fluticasone (FLONASE) 50 MCG/ACT nasal spray Place 1 spray into both nostrils 2 (two) times daily as needed for allergies or rhinitis. 16 g 2  . levothyroxine (SYNTHROID) 112 MCG tablet Take 1 tablet (112 mcg total) by mouth daily. 90 tablet 0  . metFORMIN (GLUCOPHAGE) 500 MG tablet Take 1 tablet (500 mg total) by mouth daily with breakfast. 90 tablet 0  . pantoprazole (PROTONIX) 40 MG tablet Take 40 mg by mouth at bedtime.    . prazosin (MINIPRESS) 2 MG capsule Take 1 capsule (2 mg total) by mouth 2 (two) times daily. 180 capsule 0  . sodium chloride (OCEAN) 0.65 % SOLN nasal spray Place 1 spray into both nostrils as needed for congestion. 60 mL 0  . venlafaxine XR (EFFEXOR-XR) 75 MG 24 hr capsule Take 3 capsules (225 mg total) by mouth daily with breakfast. 90 capsule 2  . zolpidem (AMBIEN) 10 MG tablet Take 10 mg by mouth at bedtime as needed for sleep.    . Ascorbic Acid (VITAMIN C) 1000 MG tablet Take 1,000 mg by mouth daily.    . metroNIDAZOLE (FLAGYL) 500 MG tablet Take 1 tablet (500 mg total) by mouth 2 (two) times daily. 14 tablet 0  . VITAMIN A PO Take 2,400 mcg by mouth.    . zinc gluconate 50 MG tablet Take 50 mg by mouth daily.     No facility-administered medications prior to visit.    Allergies  Allergen Reactions  . Ivp Dye [Iodinated Diagnostic Agents] Anaphylaxis and Other (See Comments)    "Almost died"  . Oxycodone   . Penicillins Hives and Other (See Comments)    Has patient had a PCN reaction causing immediate rash, facial/tongue/throat swelling, SOB or lightheadedness with hypotension: No Has patient had a PCN reaction causing severe rash involving mucus membranes or skin necrosis: Yes Has patient had a PCN reaction that required hospitalization No Has patient had a PCN reaction occurring within the last 10 years: No If all of the above answers are "NO", then may proceed with Cephalosporin use.   Marland Kitchen Percocet [Oxycodone-Acetaminophen] Itching    Review of Systems    All other systems reviewed and are negative.      Objective:    Physical Exam Vitals and nursing note reviewed. Exam conducted with a chaperone present.  Constitutional:      Appearance: Normal appearance.  HENT:     Head: Normocephalic.  Cardiovascular:     Rate and Rhythm: Normal rate.  Pulmonary:     Effort:  Pulmonary effort is normal.  Abdominal:     General: Abdomen is flat.     Palpations: Abdomen is soft.  Genitourinary:    General: Normal vulva.     Exam position: Knee-chest position.     Pubic Area: No rash or pubic lice.      Labia:        Right: No rash, tenderness, lesion or injury.        Left: No rash, tenderness, lesion or injury.      Urethra: No prolapse, urethral pain, urethral swelling or urethral lesion.     Vagina: No vaginal discharge.     Rectum: Normal.     Comments: H/o total hysterectomy. Dryness observed. No erythema, swelling, lesions, rash, foul odor, discharge or tenderness.  Musculoskeletal:     Cervical back: Normal range of motion and neck supple.  Skin:    General: Skin is warm and dry.  Neurological:     General: No focal deficit present.     Mental Status: She is alert and oriented to person, place, and time.  Psychiatric:        Attention and Perception: Attention normal.        Mood and Affect: Mood normal.        Speech: Speech normal.        Behavior: Behavior normal. Behavior is cooperative.        Cognition and Memory: Cognition normal.    BP 140/88 (BP Location: Left Arm, Patient Position: Sitting, Cuff Size: Large)   Pulse 76   Temp 98.1 F (36.7 C) (Oral)   Resp 18   Ht 5\' 2"  (1.575 m)   Wt 247 lb 12.8 oz (112.4 kg)   LMP 09/30/2017   SpO2 98%   BMI 45.32 kg/m  Wt Readings from Last 3 Encounters:  01/24/20 247 lb 12.8 oz (112.4 kg)  01/09/20 249 lb (112.9 kg)  09/05/19 253 lb (114.8 kg)    Lab Results  Component Value Date   TSH 5.54 (H) 01/09/2020   Lab Results  Component Value Date   WBC 5.5  01/09/2020   HGB 12.3 01/09/2020   HCT 37.4 01/09/2020   MCV 87.4 01/09/2020   PLT 240 01/09/2020   Lab Results  Component Value Date   NA 143 01/09/2020   K 4.0 01/09/2020   CO2 27 01/09/2020   GLUCOSE 106 (H) 01/09/2020   BUN 13 01/09/2020   CREATININE 0.80 01/09/2020   BILITOT 0.6 01/09/2020   ALKPHOS 71 08/25/2017   AST 9 (L) 01/09/2020   ALT 12 01/09/2020   PROT 6.9 01/09/2020   ALBUMIN 4.0 08/25/2017   CALCIUM 9.3 01/09/2020   ANIONGAP 10 02/13/2018   Lab Results  Component Value Date   CHOL 218 (H) 01/09/2020   Lab Results  Component Value Date   HDL 69 01/09/2020   Lab Results  Component Value Date   LDLCALC 131 (H) 01/09/2020   Lab Results  Component Value Date   TRIG 83 01/09/2020   Lab Results  Component Value Date   CHOLHDL 3.2 01/09/2020   Lab Results  Component Value Date   HGBA1C 6.2 (H) 01/09/2020      Assessment & Plan:  1. Your examination was consistent with vaginal dryness: Will initiate Using an over-the-counter vaginal lubricant such as KY jelly before sex and as needed. Will consider using a long-acting vaginal moisturizer and hormone replacement with follow up for continued or worsening symptoms. 2. Pending lab results will call.  Problem List Items Addressed This Visit      Other   Pelvic pain in female - Primary   Relevant Orders   WET PREP FOR [place], [place], CLUE (Completed)   GC/Chlamydia Probe Amp    Other Visit Diagnoses    Vaginal irritation       Relevant Orders   WET PREP FOR [place], YEAST, [place] (Completed)   GC/Chlamydia Probe Amp   WET PREP FOR [place], YEAST, CLUE   C. trachomatis/N. gonorrhoeae RNA   Vaginitis, atrophic       Relevant Orders   WET PREP FOR [place], YEAST, CLUE   C. trachomatis/N. gonorrhoeae RNA   H/O total hysterectomy         Follow Up: as needed for non resolving or worsening symptoms   Annie Main, FNP

## 2020-01-25 LAB — C. TRACHOMATIS/N. GONORRHOEAE RNA
C. trachomatis RNA, TMA: NOT DETECTED
N. gonorrhoeae RNA, TMA: NOT DETECTED

## 2020-01-30 ENCOUNTER — Ambulatory Visit: Payer: Medicare HMO | Admitting: Neurology

## 2020-01-30 ENCOUNTER — Encounter: Payer: Self-pay | Admitting: Neurology

## 2020-01-30 ENCOUNTER — Other Ambulatory Visit: Payer: Self-pay

## 2020-01-30 VITALS — BP 124/86 | HR 77 | Temp 97.1°F | Ht 62.0 in | Wt 248.0 lb

## 2020-01-30 DIAGNOSIS — I10 Essential (primary) hypertension: Secondary | ICD-10-CM | POA: Diagnosis not present

## 2020-01-30 DIAGNOSIS — M3500 Sicca syndrome, unspecified: Secondary | ICD-10-CM | POA: Diagnosis not present

## 2020-01-30 DIAGNOSIS — Z6841 Body Mass Index (BMI) 40.0 and over, adult: Secondary | ICD-10-CM

## 2020-01-30 DIAGNOSIS — F39 Unspecified mood [affective] disorder: Secondary | ICD-10-CM | POA: Diagnosis not present

## 2020-01-30 DIAGNOSIS — E669 Obesity, unspecified: Secondary | ICD-10-CM

## 2020-01-30 DIAGNOSIS — F4310 Post-traumatic stress disorder, unspecified: Secondary | ICD-10-CM | POA: Diagnosis not present

## 2020-01-30 DIAGNOSIS — E079 Disorder of thyroid, unspecified: Secondary | ICD-10-CM | POA: Diagnosis not present

## 2020-01-30 DIAGNOSIS — R0689 Other abnormalities of breathing: Secondary | ICD-10-CM

## 2020-01-30 NOTE — Patient Instructions (Signed)
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
## 2020-02-13 ENCOUNTER — Ambulatory Visit (INDEPENDENT_AMBULATORY_CARE_PROVIDER_SITE_OTHER): Payer: Medicare HMO | Admitting: Neurology

## 2020-02-13 DIAGNOSIS — G4733 Obstructive sleep apnea (adult) (pediatric): Secondary | ICD-10-CM | POA: Diagnosis not present

## 2020-02-13 DIAGNOSIS — G4709 Other insomnia: Secondary | ICD-10-CM

## 2020-02-13 DIAGNOSIS — F4310 Post-traumatic stress disorder, unspecified: Secondary | ICD-10-CM

## 2020-02-13 DIAGNOSIS — R0683 Snoring: Secondary | ICD-10-CM

## 2020-02-13 DIAGNOSIS — R7303 Prediabetes: Secondary | ICD-10-CM

## 2020-02-13 DIAGNOSIS — E079 Disorder of thyroid, unspecified: Secondary | ICD-10-CM

## 2020-02-14 ENCOUNTER — Ambulatory Visit
Admission: RE | Admit: 2020-02-14 | Discharge: 2020-02-14 | Disposition: A | Discharge status: Home / Self Care | Payer: Medicare HMO | Source: Ambulatory Visit | Attending: Family Medicine | Admitting: Family Medicine

## 2020-02-14 ENCOUNTER — Other Ambulatory Visit: Payer: Self-pay

## 2020-02-14 ENCOUNTER — Ambulatory Visit: Payer: Medicare HMO

## 2020-02-14 DIAGNOSIS — Z1231 Encounter for screening mammogram for malignant neoplasm of breast: Secondary | ICD-10-CM

## 2020-02-15 ENCOUNTER — Other Ambulatory Visit: Payer: Self-pay | Admitting: Family Medicine

## 2020-02-15 DIAGNOSIS — R928 Other abnormal and inconclusive findings on diagnostic imaging of breast: Secondary | ICD-10-CM

## 2020-02-27 ENCOUNTER — Other Ambulatory Visit: Payer: Medicare HMO

## 2020-02-27 ENCOUNTER — Other Ambulatory Visit: Payer: Self-pay

## 2020-02-27 DIAGNOSIS — E079 Disorder of thyroid, unspecified: Secondary | ICD-10-CM | POA: Diagnosis not present

## 2020-02-27 DIAGNOSIS — G4709 Other insomnia: Secondary | ICD-10-CM | POA: Insufficient documentation

## 2020-02-27 DIAGNOSIS — R0683 Snoring: Secondary | ICD-10-CM | POA: Insufficient documentation

## 2020-02-27 NOTE — Progress Notes (Signed)
Summary & Diagnosis:   Moderate OSA was documented in this HST at AHI of 24.1/h and  during REM sleep accentuated to AHI 66/h. There was bradycardia  present but no clinically relevant degree or time in hypoxia. All  sleep recorded was marked as supine sleep.     Recommendations:     This is REM sleep dependent apnea and needs intervention with  positive airway pressure. I recommend CPAP autotitration device  with a setting of 6-16 cm water, 1 cm EPR and heated humidity.  The interface should be fitted by DME.  Interpreting Physician: Larey Seat, MD

## 2020-02-27 NOTE — Addendum Note (Signed)
Addended by: Larey Seat on: 02/27/2020 12:56 PM   Modules accepted: Orders

## 2020-02-27 NOTE — Procedures (Signed)
Patient Information     First Name: Janice Last Name: Roberts ID: [id number]  Birth Date: [birth year] Age: 44 Gender: Female  Referring Provider: Alycia Rossetti, MD BMI: 45.8 (W=249 lb, H=5' 2'')  Neck Circ.:  15 '' Epworth:  8/24   Sleep Study Information    Study Date: Feb 14, 2020 S/H/A Version: 001.001.001.001 / 4.1.1528 / 32  History:    [name] is a 44 y.o. year old African American female with main concern of snoring, witnessed apneas and choking in her sleep.  She has a medical history of Anxiety, Asthma, Depression, Hypertension, Hypothyroidism, Obesity, Seasonal allergies, SVD (spontaneous vaginal delivery) (1997), and Thyroid disease. She never had a sleep study. She underwent a hysterectomy at [facility] in 2019, at age 78, she had already been in menopause. Hx:  of endometriosis. 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  Sleep relevant medical history:  Insomnia, difficulties to fall asleep and to stay asleep.      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 There was bradycardia present but no clinically relevant degree or time in hypoxia. All sleep recorded was marked as supine sleep.     Recommendations:      This is REM sleep dependent apnea and needs intervention with positive airway pressure. I recommend CPAP autotitration device with a setting of 6-16 cm water, 1 cm EPR and heated humidity. The interface should be fitted by DME.  Interpreting Physician: Larey Seat, MD             Sleep Summary  Oxygen Saturation Statistics   Start Study Time: End Study Time: Total Recording Time:  12:28:16 AM 10:24:15 AM 9 h, 55 min  Total Sleep Time % REM of Sleep Time:  7 h, 5 min  15.2    Mean: 93 Minimum: 64 Maximum: 98  Mean of Desaturations Nadirs (%):   90  Oxygen Desaturation. %:   4-9 10-20 >20 Total  Events Number Total    32   0 97.0 0.0  1 3.0  33 100.0  Oxygen Saturation: <90 <=88 <85 <80 <70  Duration (minutes): Sleep % 1.3 0.3  0.3 0.1  0.1 0.0 0.0 0.0 0.0 0.0     Respiratory Indices      Total Events REM NREM All Night  pRDI:  103  pAHI:  95 ODI:  33  pAHIc:  2  % CSR: 0.0 68.9 66.6 36.8 2.3 20.8 18.8 4.9 0.3 26.2 24.1 8.4 0.5       Pulse Rate Statistics during Sleep (BPM)      Mean: 71 Minimum: 36 Maximum:  104    Indices are calculated using technically valid sleep time of 6h, 56 min. pRDI/pAHI are calculated using 02 desaturations ? 3% Sit Body Position Statistics  Position Supine Prone Right Left Non-Supine  Sleep (min) 414.5 0.0 0.0 10.0 10.0  Sleep % 97.4 0.0 0.0 2.4 2.4  pRDI 27.0 N/A N/A N/A N/A  pAHI 24.9 N/A N/A N/A N/A  ODI 8.7 N/A N/A N/A N/A     Snoring Statistics Snoring Level (dB) >40 >50 >60 >70 >80 >Threshold (45)  Sleep (min) 299.2 41.2 3.8 0.0 0.0 106.5  Sleep % 70.3 9.7 0.9 0.0 0.0 25.0    Mean: 43 dB Sleep Stages Chart  pAHI=24.1                                                 Mild              Moderate                    Severe                                                 5              15                    30  * Reference values are according to AASM guidelines

## 2020-02-28 ENCOUNTER — Encounter: Payer: Self-pay | Admitting: Neurology

## 2020-02-28 ENCOUNTER — Telehealth: Payer: Self-pay | Admitting: Neurology

## 2020-02-28 LAB — EXTRA LAV TOP TUBE

## 2020-02-28 LAB — TSH: TSH: 0.55 mIU/L

## 2020-02-28 NOTE — Telephone Encounter (Signed)
-----   Message from Larey Seat, MD sent at 02/27/2020 12:56 PM EDT ----- Summary & Diagnosis:   Moderate OSA was documented in this HST at AHI of 24.1/h and  during REM sleep accentuated to AHI 66/h. There was bradycardia  present but no clinically relevant degree or time in hypoxia. All  sleep recorded was marked as supine sleep.     Recommendations:     This is REM sleep dependent apnea and needs intervention with  positive airway pressure. I recommend CPAP autotitration device  with a setting of 6-16 cm water, 1 cm EPR and heated humidity.  The interface should be fitted by DME.  Interpreting Physician: Larey Seat, MD

## 2020-02-28 NOTE — Telephone Encounter (Signed)
I called pt. I advised pt that Dr. Brett Fairy reviewed their sleep study results and found that pt has moderate sleep apnea. Dr. Brett Fairy recommends that pt starts auto CPAP. I reviewed PAP compliance expectations with the pt. Pt is agreeable to starting a CPAP. I advised pt that an order will be sent to a DME, [facility], and [facility] will call the pt within about one week after they file with the pt's insurance. [facility] will show the pt how to use the machine, fit for masks, and troubleshoot the CPAP if needed. A follow up appt was made for insurance purposes with Ward Givens, NP on July 1,2021 at 11 am . Pt verbalized understanding to arrive 15 minutes early and bring their CPAP. A letter with all of this information in it will be mailed to the pt as a reminder. I verified with the pt that the address we have on file is correct. Pt verbalized understanding of results. Pt had no questions at this time but was encouraged to call back if questions arise. I have sent the order to [facility] and have received confirmation that they have received the order.

## 2020-02-29 ENCOUNTER — Other Ambulatory Visit: Payer: Self-pay

## 2020-02-29 ENCOUNTER — Ambulatory Visit
Admission: RE | Admit: 2020-02-29 | Discharge: 2020-02-29 | Disposition: A | Discharge status: Home / Self Care | Payer: Medicare HMO | Source: Ambulatory Visit | Attending: Family Medicine | Admitting: Family Medicine

## 2020-02-29 DIAGNOSIS — R928 Other abnormal and inconclusive findings on diagnostic imaging of breast: Secondary | ICD-10-CM

## 2020-02-29 DIAGNOSIS — N6002 Solitary cyst of left breast: Secondary | ICD-10-CM | POA: Diagnosis not present

## 2020-03-11 ENCOUNTER — Telehealth (INDEPENDENT_AMBULATORY_CARE_PROVIDER_SITE_OTHER): Payer: Medicare HMO | Admitting: Psychiatry

## 2020-03-11 ENCOUNTER — Other Ambulatory Visit: Payer: Self-pay

## 2020-03-11 DIAGNOSIS — F4310 Post-traumatic stress disorder, unspecified: Secondary | ICD-10-CM | POA: Diagnosis not present

## 2020-03-11 DIAGNOSIS — F410 Panic disorder [episodic paroxysmal anxiety] without agoraphobia: Secondary | ICD-10-CM

## 2020-03-11 MED ORDER — VENLAFAXINE HCL ER 75 MG PO CP24: 225.0000 mg | ORAL_CAPSULE | Freq: Every day | ORAL | 5 refills | Status: DC

## 2020-03-11 MED ORDER — PRAZOSIN HCL 2 MG PO CAPS: 2.0000 mg | ORAL_CAPSULE | Freq: Two times a day (BID) | ORAL | 0 refills | Status: DC
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
## 2020-03-13 ENCOUNTER — Other Ambulatory Visit: Payer: Self-pay | Admitting: *Deleted

## 2020-03-13 ENCOUNTER — Other Ambulatory Visit: Payer: Self-pay | Admitting: Family Medicine

## 2020-03-13 DIAGNOSIS — I10 Essential (primary) hypertension: Secondary | ICD-10-CM

## 2020-03-13 MED ORDER — AMLODIPINE BESYLATE 5 MG PO TABS: 5.0000 mg | ORAL_TABLET | Freq: Every day | ORAL | 2 refills | Status: DC

## 2020-03-18 NOTE — Telephone Encounter (Signed)
Received an email from [employer] advising the below information. Will wait to see if the patient calls [employer] back to restart.   "04/30 - called pt to go over financials and scheduling, no answer lvm. 05/04 spoke with patient, went over financials, pt advised she does not have any extra money right now. I let her know if she changes her mind, she can give me a call. I have voided out this order, just wanted to make you aware. "

## 2020-03-19 ENCOUNTER — Other Ambulatory Visit: Payer: Self-pay | Admitting: Family Medicine

## 2020-03-19 DIAGNOSIS — I10 Essential (primary) hypertension: Secondary | ICD-10-CM

## 2020-05-01 ENCOUNTER — Encounter: Payer: Self-pay | Admitting: Adult Health

## 2020-05-08 ENCOUNTER — Ambulatory Visit: Payer: Self-pay | Admitting: Adult Health

## 2020-05-22 ENCOUNTER — Other Ambulatory Visit: Payer: Self-pay | Admitting: Family Medicine

## 2020-05-25 ENCOUNTER — Other Ambulatory Visit: Payer: Self-pay | Admitting: Family Medicine

## 2020-05-25 DIAGNOSIS — I10 Essential (primary) hypertension: Secondary | ICD-10-CM

## 2020-06-11 ENCOUNTER — Other Ambulatory Visit: Payer: Self-pay

## 2020-06-11 ENCOUNTER — Telehealth (INDEPENDENT_AMBULATORY_CARE_PROVIDER_SITE_OTHER): Payer: Medicare HMO | Admitting: Psychiatry

## 2020-06-11 DIAGNOSIS — F410 Panic disorder [episodic paroxysmal anxiety] without agoraphobia: Secondary | ICD-10-CM

## 2020-06-11 DIAGNOSIS — F4310 Post-traumatic stress disorder, unspecified: Secondary | ICD-10-CM

## 2020-06-11 MED ORDER — PRAZOSIN HCL 2 MG PO CAPS: 2.0000 mg | ORAL_CAPSULE | Freq: Two times a day (BID) | ORAL | 0 refills | Status: DC

## 2020-06-11 MED ORDER — CLONAZEPAM 1 MG PO TABS: 1.0000 mg | ORAL_TABLET | Freq: Two times a day (BID) | ORAL | 2 refills | Status: DC | PRN
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
## 2020-07-11 ENCOUNTER — Telehealth (INDEPENDENT_AMBULATORY_CARE_PROVIDER_SITE_OTHER): Payer: Medicare HMO | Admitting: Family Medicine

## 2020-07-11 DIAGNOSIS — I10 Essential (primary) hypertension: Secondary | ICD-10-CM

## 2020-07-11 MED ORDER — AMLODIPINE BESYLATE 5 MG PO TABS: 5.0000 mg | ORAL_TABLET | Freq: Every day | ORAL | 0 refills | Status: DC

## 2020-07-11 MED ORDER — METFORMIN HCL 500 MG PO TABS: 500.0000 mg | ORAL_TABLET | Freq: Every day | ORAL | 0 refills | Status: DC

## 2020-07-11 MED ORDER — BENAZEPRIL HCL 20 MG PO TABS: 20.0000 mg | ORAL_TABLET | Freq: Every day | ORAL | 0 refills | Status: DC

## 2020-07-11 MED ORDER — LEVOTHYROXINE SODIUM 112 MCG PO TABS: 112.0000 ug | ORAL_TABLET | Freq: Every day | ORAL | 0 refills | Status: DC

## 2020-07-11 NOTE — Progress Notes (Signed)
Virtual Visit via Video Note  I connected with Janice Roberts on 07/11/20 at 10:29am  by a video enabled telemedicine application and verified that I am speaking with the correct person using two identifiers.     Pt location: at work    Physician location:  In office, [facility], Vic Blackbird MD     On call: patient and physician   I discussed the limitations of evaluation and management by telemedicine and the availability of in person appointments. The patient expressed understanding and agreed to proceed.  History of Present Illness:   Fatigue diasgnosed with Moderate OSA she is unable to afford CPAP therapy so she has not been using. States that she has a contact number for the sleep center when she is able to afford the device.  Diabetes mellitus along with obesity she was started on Metformin she took all the medication felt like it was helping control her sugar cravings and her weight but then she ran out of the medicine. She would like to restart.  Tension she is taking her blood pressure medicine as prescribed states her blood pressure has been good.  Continues to follow with her psychiatrist she feels like her mood has been stable on her current regimen and feels pretty good. Observations/Objective:  GED- NAD noted,  RESP-normal work of breathing  Psych Normal affect and mood   Assessment and Plan: HTN- controlled no changes, F/U in office for  Labs and visit in 1 month , ptcurrently working out of town  [employer] DM/Obesity- restart metformin 500mg  once a day, low carb diet   GAD/MDD- per psychiatry   Meds refilled   Hypothyroidism recheck TSH at [place], continue replacement   Follow Up Instructions:    I discussed the assessment and treatment plan with the patient. The patient was provided an opportunity to ask questions and all were answered. The patient agreed with the plan and demonstrated an understanding of the instructions.   The patient was  advised to call back or seek an in-person evaluation if the symptoms worsen or if the condition fails to improve as anticipated.  I provided 8 minutes of non-face-to-face time during this encounter. End Time 10:37am   Vic Blackbird, MD

## 2020-07-13 ENCOUNTER — Encounter: Payer: Self-pay | Admitting: Family Medicine

## 2020-09-04 ENCOUNTER — Encounter: Payer: Self-pay | Admitting: Family Medicine

## 2020-09-11 ENCOUNTER — Telehealth (INDEPENDENT_AMBULATORY_CARE_PROVIDER_SITE_OTHER): Payer: Self-pay | Admitting: Psychiatry

## 2020-09-11 ENCOUNTER — Other Ambulatory Visit: Payer: Self-pay

## 2020-09-11 DIAGNOSIS — F410 Panic disorder [episodic paroxysmal anxiety] without agoraphobia: Secondary | ICD-10-CM

## 2020-09-11 DIAGNOSIS — F4310 Post-traumatic stress disorder, unspecified: Secondary | ICD-10-CM

## 2020-09-11 MED ORDER — PRAZOSIN HCL 2 MG PO CAPS: 2.0000 mg | ORAL_CAPSULE | Freq: Two times a day (BID) | ORAL | 1 refills | Status: DC

## 2020-09-11 MED ORDER — VENLAFAXINE HCL ER 75 MG PO CP24: 225.0000 mg | ORAL_CAPSULE | Freq: Every day | ORAL | 5 refills | Status: DC

## 2020-09-11 MED ORDER — CLONAZEPAM 1 MG PO TABS: 1.0000 mg | ORAL_TABLET | Freq: Two times a day (BID) | ORAL | 3 refills | Status: DC | PRN
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
## 2020-10-01 ENCOUNTER — Ambulatory Visit (INDEPENDENT_AMBULATORY_CARE_PROVIDER_SITE_OTHER): Payer: Self-pay | Admitting: Family Medicine

## 2020-10-01 ENCOUNTER — Other Ambulatory Visit: Payer: Self-pay

## 2020-10-01 ENCOUNTER — Encounter: Payer: Self-pay | Admitting: Family Medicine

## 2020-10-01 VITALS — BP 130/84 | HR 76 | Temp 99.1°F | Resp 16 | Ht 62.0 in | Wt 262.0 lb

## 2020-10-01 DIAGNOSIS — E079 Disorder of thyroid, unspecified: Secondary | ICD-10-CM

## 2020-10-01 DIAGNOSIS — E669 Obesity, unspecified: Secondary | ICD-10-CM

## 2020-10-01 DIAGNOSIS — N760 Acute vaginitis: Secondary | ICD-10-CM

## 2020-10-01 DIAGNOSIS — B9689 Other specified bacterial agents as the cause of diseases classified elsewhere: Secondary | ICD-10-CM

## 2020-10-01 DIAGNOSIS — I10 Essential (primary) hypertension: Secondary | ICD-10-CM

## 2020-10-01 DIAGNOSIS — R7303 Prediabetes: Secondary | ICD-10-CM

## 2020-10-01 DIAGNOSIS — G43C0 Periodic headache syndromes in child or adult, not intractable: Secondary | ICD-10-CM

## 2020-10-01 DIAGNOSIS — G8929 Other chronic pain: Secondary | ICD-10-CM

## 2020-10-01 DIAGNOSIS — M545 Low back pain, unspecified: Secondary | ICD-10-CM

## 2020-10-01 LAB — WET PREP FOR TRICH, YEAST, CLUE

## 2020-10-01 MED ORDER — METRONIDAZOLE 500 MG PO TABS: 500.0000 mg | ORAL_TABLET | Freq: Two times a day (BID) | ORAL | 0 refills | Status: DC

## 2020-10-01 MED ORDER — SUMATRIPTAN SUCCINATE 100 MG PO TABS: ORAL_TABLET | ORAL | 0 refills | Status: DC

## 2020-10-01 NOTE — Patient Instructions (Addendum)
Try summers Eve/ Vagisil  Take the flagyl  Referral to orthopedics  Try imirex for migraines  F/U 3 months

## 2020-10-01 NOTE — Assessment & Plan Note (Signed)
Controlled no changes 

## 2020-10-01 NOTE — Progress Notes (Signed)
Subjective:    Patient ID: Janice Roberts, female    DOB: September 25, 1976, 44 y.o.   MRN: [id number]  Patient presents for Tingling in B Breasts (x2 weeks- c/o tingling under skin ), Vaginal Irritation (wants to be checked for BV- reports that she always gets it after intercourse with condom), Headache (intermittent HA- frontal HA with hormonal changes), and Back Pain (generalized back pain- reports that she has DDD and neck pain with pinched nerve)   Does not have menses has hysterectomy  For the past 2 weeks has had a tingling sensation in her breast, no lump, No drainage from nipple , nothing on skin Had diagnostic mammogram in the Spring   Vaginal discomfort- was zest    Swab , started using DOVE soap    She has some time     Has had BV in the pasat     Headache during menses time, she had beginning and end of the month   tylenl and ibuprofen help   Pre diabetes- currently on metformin 500mg  once a day , weight is up 14lbs since our last visit staes she doesn't eat very much   HTN- taking bp meds as prescribed  Hyperlipidemia due for recheck, treating with diet  Hypothyroidism due for recheck on TFT   Chronic back pain low back, non radiating, no tingling or numbness in LE, uses OTC meds as needed Request referral to orthopedics          Review Of Systems:  GEN- denies fatigue, fever, weight loss,weakness, recent illness HEENT- denies eye drainage, change in vision, nasal discharge, CVS- denies chest pain, palpitations RESP- denies SOB, cough, wheeze ABD- denies N/V, change in stools, abd pain GU- denies dysuria, hematuria, dribbling, incontinence MSK- + joint pain, muscle aches, injury Neuro- denies headache, dizziness, syncope, seizure activity       Objective:    BP 130/84   Pulse 76   Temp 99.1 F (37.3 C) (Temporal)   Resp 16   Ht 5\' 2"  (1.575 m)   Wt 262 lb (118.8 kg)   LMP 09/30/2017   SpO2 96%   BMI 47.92 kg/m  GEN- NAD, alert and oriented  x3 HEENT- PERRL, EOMI, non injected sclera, pink conjunctiva, MMM, oropharynx clear Neck- Supple, no thyromegaly Breast- normal symmetry, no nipple inversion,no nipple drainage, no nodules or lumps felt Nodes- no axillary nodes CVS- RRR, no murmur RESP-CTAB ABD-NABS,soft,NT,ND GU- normal external genitalia, vaginal mucosa pink and moist, , + discharge, no CMT, SPINE, Mild TTP lumbar spine, neg SLR, fair ROM spine, sensation and strength in tact LE  Neuro-CNII-XII in tact  EXT- No edema Pulses- Radial 2+        Assessment & Plan:      Problem List Items Addressed This Visit      Unprioritized   Borderline diabetic    Recheck A1C, weight gain noted, concern she has now developed diabetes      Relevant Orders   Hemoglobin A1c (Completed)   Class 3 obesity    Need for reduced carbs and sugars in diet       Hypertension    Controlled no changes       Relevant Orders   CBC with Differential/Platelet (Completed)   Comprehensive metabolic panel (Completed)   Lipid panel (Completed)   Migraines    Prn imitrex for now, in setting of her other mental health meds      Relevant Medications   SUMAtriptan (IMITREX) 100 MG tablet  Thyroid disease    Recheck TFT  Continue replacement       Relevant Orders   TSH (Completed)   T3, free (Completed)   T4, free (Completed)    Other Visit Diagnoses    BV (bacterial vaginosis)    -  Primary   Flagl given   Relevant Medications   metroNIDAZOLE (FLAGYL) 500 MG tablet   Other Relevant Orders   WET PREP FOR [place], YEAST, CLUE (Completed)   C. trachomatis/N. gonorrhoeae RNA   Chronic bilateral low back pain without sciatica       referral to ortho at pt request, no red flags, normal xay in 2016   Relevant Orders   Ambulatory referral to Orthopedic Surgery      Note: This dictation was prepared with Dragon dictation along with smaller phrase technology. Any transcriptional errors that result from this process are  unintentional.

## 2020-10-01 NOTE — Assessment & Plan Note (Signed)
Need for reduced carbs and sugars in diet

## 2020-10-01 NOTE — Assessment & Plan Note (Addendum)
Recheck A1C, weight gain noted, concern she has now developed diabetes

## 2020-10-02 ENCOUNTER — Encounter: Payer: Self-pay | Admitting: Family Medicine

## 2020-10-02 DIAGNOSIS — G43909 Migraine, unspecified, not intractable, without status migrainosus: Secondary | ICD-10-CM | POA: Insufficient documentation

## 2020-10-02 LAB — COMPREHENSIVE METABOLIC PANEL
AG Ratio: 1.4 (calc) (ref 1.0–2.5)
ALT: 12 U/L (ref 6–29)
AST: 11 U/L (ref 10–35)
Albumin: 4.2 g/dL (ref 3.6–5.1)
Alkaline phosphatase (APISO): 68 U/L (ref 31–125)
BUN: 14 mg/dL (ref 7–25)
CO2: 27 mmol/L (ref 20–32)
Calcium: 9.4 mg/dL (ref 8.6–10.2)
Chloride: 104 mmol/L (ref 98–110)
Creat: 0.86 mg/dL (ref 0.50–1.10)
Globulin: 2.9 g/dL (calc) (ref 1.9–3.7)
Glucose, Bld: 112 mg/dL — ABNORMAL HIGH (ref 65–99)
Potassium: 4.2 mmol/L (ref 3.5–5.3)
Sodium: 139 mmol/L (ref 135–146)
Total Bilirubin: 0.3 mg/dL (ref 0.2–1.2)
Total Protein: 7.1 g/dL (ref 6.1–8.1)

## 2020-10-02 LAB — CBC WITH DIFFERENTIAL/PLATELET
Absolute Monocytes: 416 cells/uL (ref 200–950)
Basophils Absolute: 20 cells/uL (ref 0–200)
Basophils Relative: 0.3 %
Eosinophils Absolute: 79 cells/uL (ref 15–500)
Eosinophils Relative: 1.2 %
HCT: 38.4 % (ref 35.0–45.0)
Hemoglobin: 12.8 g/dL (ref 11.7–15.5)
Lymphs Abs: 3227 cells/uL (ref 850–3900)
MCH: 28.6 pg (ref 27.0–33.0)
MCHC: 33.3 g/dL (ref 32.0–36.0)
MCV: 85.9 fL (ref 80.0–100.0)
MPV: 10.8 fL (ref 7.5–12.5)
Monocytes Relative: 6.3 %
Neutro Abs: 2858 cells/uL (ref 1500–7800)
Neutrophils Relative %: 43.3 %
Platelets: 263 10*3/uL (ref 140–400)
RBC: 4.47 10*6/uL (ref 3.80–5.10)
RDW: 14.1 % (ref 11.0–15.0)
Total Lymphocyte: 48.9 %
WBC: 6.6 10*3/uL (ref 3.8–10.8)

## 2020-10-02 LAB — HEMOGLOBIN A1C
Hgb A1c MFr Bld: 6.1 % of total Hgb — ABNORMAL HIGH (ref <5.7)
Mean Plasma Glucose: 128 (calc)
eAG (mmol/L): 7.1 (calc)

## 2020-10-02 LAB — LIPID PANEL
Cholesterol: 166 mg/dL (ref <200)
HDL: 67 mg/dL (ref >=50)
LDL Cholesterol (Calc): 85 mg/dL (calc)
Non-HDL Cholesterol (Calc): 99 mg/dL (calc) (ref <130)
Total CHOL/HDL Ratio: 2.5 (calc) (ref <5.0)
Triglycerides: 65 mg/dL (ref <150)

## 2020-10-02 LAB — T4, FREE: Free T4: 1.3 ng/dL (ref 0.8–1.8)

## 2020-10-02 LAB — T3, FREE: T3, Free: 3.1 pg/mL (ref 2.3–4.2)

## 2020-10-02 LAB — TSH: TSH: 1.18 mIU/L

## 2020-10-02 NOTE — Assessment & Plan Note (Signed)
Recheck TFT  Continue replacement

## 2020-10-02 NOTE — Assessment & Plan Note (Signed)
Prn imitrex for now, in setting of her other mental health meds

## 2020-10-03 LAB — C. TRACHOMATIS/N. GONORRHOEAE RNA
C. trachomatis RNA, TMA: NOT DETECTED
N. gonorrhoeae RNA, TMA: NOT DETECTED

## 2020-10-20 ENCOUNTER — Emergency Department (HOSPITAL_COMMUNITY): Payer: Self-pay

## 2020-10-20 ENCOUNTER — Other Ambulatory Visit: Payer: Self-pay

## 2020-10-20 ENCOUNTER — Emergency Department (HOSPITAL_COMMUNITY)
Admission: EM | Admit: 2020-10-20 | Discharge: 2020-10-20 | Disposition: A | Discharge status: Home / Self Care | Payer: Self-pay | Attending: Emergency Medicine | Admitting: Emergency Medicine

## 2020-10-20 DIAGNOSIS — J45909 Unspecified asthma, uncomplicated: Secondary | ICD-10-CM | POA: Insufficient documentation

## 2020-10-20 DIAGNOSIS — E039 Hypothyroidism, unspecified: Secondary | ICD-10-CM | POA: Insufficient documentation

## 2020-10-20 DIAGNOSIS — I10 Essential (primary) hypertension: Secondary | ICD-10-CM | POA: Insufficient documentation

## 2020-10-20 DIAGNOSIS — N644 Mastodynia: Secondary | ICD-10-CM | POA: Insufficient documentation

## 2020-10-20 DIAGNOSIS — R109 Unspecified abdominal pain: Secondary | ICD-10-CM

## 2020-10-20 DIAGNOSIS — Z79899 Other long term (current) drug therapy: Secondary | ICD-10-CM | POA: Insufficient documentation

## 2020-10-20 DIAGNOSIS — Z91040 Latex allergy status: Secondary | ICD-10-CM | POA: Insufficient documentation

## 2020-10-20 DIAGNOSIS — R1011 Right upper quadrant pain: Secondary | ICD-10-CM | POA: Insufficient documentation

## 2020-10-20 LAB — BASIC METABOLIC PANEL
Anion gap: 10 (ref 5–15)
BUN: 11 mg/dL (ref 6–20)
CO2: 21 mmol/L — ABNORMAL LOW (ref 22–32)
Calcium: 8.8 mg/dL — ABNORMAL LOW (ref 8.9–10.3)
Chloride: 106 mmol/L (ref 98–111)
Creatinine, Ser: 0.88 mg/dL (ref 0.44–1.00)
GFR, Estimated: >60 mL/min (ref >60)
Glucose, Bld: 123 mg/dL — ABNORMAL HIGH (ref 70–99)
Potassium: 3.9 mmol/L (ref 3.5–5.1)
Sodium: 137 mmol/L (ref 135–145)

## 2020-10-20 LAB — CBC WITH DIFFERENTIAL/PLATELET
Abs Immature Granulocytes: 0.03 10*3/uL (ref 0.00–0.07)
Basophils Absolute: 0 10*3/uL (ref 0.0–0.1)
Basophils Relative: 0 %
Eosinophils Absolute: 0 10*3/uL (ref 0.0–0.5)
Eosinophils Relative: 0 %
HCT: 39 % (ref 36.0–46.0)
Hemoglobin: 12.4 g/dL (ref 12.0–15.0)
Immature Granulocytes: 0 %
Lymphocytes Relative: 42 %
Lymphs Abs: 3 10*3/uL (ref 0.7–4.0)
MCH: 28.1 pg (ref 26.0–34.0)
MCHC: 31.8 g/dL (ref 30.0–36.0)
MCV: 88.2 fL (ref 80.0–100.0)
Monocytes Absolute: 0.6 10*3/uL (ref 0.1–1.0)
Monocytes Relative: 8 %
Neutro Abs: 3.5 10*3/uL (ref 1.7–7.7)
Neutrophils Relative %: 50 %
Platelets: 269 10*3/uL (ref 150–400)
RBC: 4.42 MIL/uL (ref 3.87–5.11)
RDW: 14.5 % (ref 11.5–15.5)
WBC: 7.2 10*3/uL (ref 4.0–10.5)
nRBC: 0 % (ref 0.0–0.2)

## 2020-10-20 LAB — URINALYSIS, ROUTINE W REFLEX MICROSCOPIC
Bilirubin Urine: NEGATIVE
Glucose, UA: NEGATIVE mg/dL
Hgb urine dipstick: NEGATIVE
Ketones, ur: NEGATIVE mg/dL
Leukocytes,Ua: NEGATIVE
Nitrite: NEGATIVE
Protein, ur: NEGATIVE mg/dL
Specific Gravity, Urine: 1.015 (ref 1.005–1.030)
pH: 5 (ref 5.0–8.0)

## 2020-10-20 LAB — HEPATIC FUNCTION PANEL
ALT: 27 U/L (ref 0–44)
AST: 23 U/L (ref 15–41)
Albumin: 3.7 g/dL (ref 3.5–5.0)
Alkaline Phosphatase: 65 U/L (ref 38–126)
Bilirubin, Direct: 0.4 mg/dL — ABNORMAL HIGH (ref 0.0–0.2)
Indirect Bilirubin: 0.6 mg/dL (ref 0.3–0.9)
Total Bilirubin: 1 mg/dL (ref 0.3–1.2)
Total Protein: 7.3 g/dL (ref 6.5–8.1)

## 2020-10-20 LAB — TROPONIN I (HIGH SENSITIVITY)
Troponin I (High Sensitivity): <2 ng/L (ref <18)
Troponin I (High Sensitivity): 2 ng/L (ref <18)

## 2020-10-20 LAB — D-DIMER, QUANTITATIVE: D-Dimer, Quant: 0.93 ug/mL-FEU — ABNORMAL HIGH (ref 0.00–0.50)

## 2020-10-20 LAB — LIPASE, BLOOD: Lipase: 25 U/L (ref 11–51)

## 2020-10-20 MED ORDER — FENTANYL CITRATE (PF) 100 MCG/2ML IJ SOLN
50.0000 ug | Freq: Once | INTRAMUSCULAR | Status: AC
  Administered 2020-10-20: 11:00:00 50 ug via INTRAVENOUS
  Filled 2020-10-20: qty 2

## 2020-10-20 MED ORDER — IBUPROFEN 800 MG PO TABS: 800.0000 mg | ORAL_TABLET | Freq: Three times a day (TID) | ORAL | 0 refills | Status: DC

## 2020-10-20 MED ORDER — METHOCARBAMOL 500 MG PO TABS: 500.0000 mg | ORAL_TABLET | Freq: Two times a day (BID) | ORAL | 0 refills | Status: DC

## 2020-10-20 MED ORDER — TECHNETIUM TO 99M ALBUMIN AGGREGATED
4.4000 | Freq: Once | INTRAVENOUS | Status: AC | PRN
  Administered 2020-10-20: 13:00:00 4.4 via INTRAVENOUS

## 2020-10-20 MED ORDER — KETOROLAC TROMETHAMINE 60 MG/2ML IM SOLN
60.0000 mg | Freq: Once | INTRAMUSCULAR | Status: AC
  Administered 2020-10-20: 15:00:00 60 mg via INTRAMUSCULAR
  Filled 2020-10-20: qty 2

## 2020-10-20 MED ORDER — FENTANYL CITRATE (PF) 100 MCG/2ML IJ SOLN
50.0000 ug | Freq: Once | INTRAMUSCULAR | Status: AC
  Administered 2020-10-20: 50 ug via INTRAVENOUS
  Filled 2020-10-20: qty 2

## 2020-10-20 MED ORDER — METHOCARBAMOL 500 MG PO TABS
500.0000 mg | ORAL_TABLET | Freq: Once | ORAL | Status: AC
  Administered 2020-10-20: 11:00:00 500 mg via ORAL
  Filled 2020-10-20: qty 1

## 2020-10-20 NOTE — ED Notes (Signed)
Patient transported to [facility] 

## 2020-10-20 NOTE — ED Notes (Signed)
Lab to add trop, hepatic function panel and lipase to current sample.

## 2020-10-20 NOTE — Discharge Instructions (Addendum)
We have performed many tests today to find the cause of your pain which were all very reassuring and ruled out many life threatening causes.  There is no signs of gallstones, kidney stones, blood clots in your lungs.  I suspect that your pain may be caused due to a muscular strain.  Please take the muscle relaxer Robaxin at night, do not drink or drive the medication.  Please take ibuprofen as needed for pain.  I have also provided some low back stretches.  Please follow-up with your primary care doctor if your symptoms do not improve.  Return to the ER for any new or worsening symptoms.

## 2020-10-20 NOTE — ED Provider Notes (Addendum)
[facility] EMERGENCY DEPARTMENT Provider Note   CSN: [id number] Arrival date & time: 10/20/20  0404     History Chief Complaint  Patient presents with  . Flank Pain    Janice Roberts is a 44 y.o. female.  HPI 44 year old female with a history of anxiety, asthma, depression, hypertension, hypothyroidism, obesity, thyroid disease presents to the ER with complaints of pain under her right breast and her right flank.  She refers that she was walking at work and started to feel some pain in her right side.  She initially thought that this was her allergies and so she went and took some Flonase.  First episode occurred at around 1 AM.  She states that around 2 or 2:15AM she was walking around at work again and began to experience this pain again.  Pain has gradually gotten worse.  Denies any injuries or heavy lifting. Describes it as "something trying to poke through my skin".  She refers that she feels as though it is hard for her to breathe.  Nothing makes it better, talking, moving and taking deep breaths makes it worse.  She denies any chest pain.  Denies any fevers or chills.  Denies any dysuria or hematuria.  No vaginal bleeding or discharge.  No prior history of kidney stones or gallstones.  She has not taken anything for her pain.  Denies any hemoptysis.  No numbness or tingling in her extremities, no loss of bowel bladder control.    Past Medical History:  Diagnosis Date  . Anxiety    no meds  . Asthma    rarely uses inhaler - seasonal with allergies  . Depression    no med  . Hypertension   . Hypothyroidism   . Obesity   . Seasonal allergies   . SVD (spontaneous vaginal delivery) 1997   x   . Thyroid disease     Patient Active Problem List   Diagnosis Date Noted  . Migraines 10/02/2020  . Other insomnia 02/27/2020  . Snoring 02/27/2020  . Sicca syndrome, unspecified 01/30/2020  . Mood disorder ([place]) 01/30/2020  . Panic disorder 12/13/2019  .  Seasonal allergies 11/30/2019  . Borderline diabetic 09/05/2019  . PTSD (post-traumatic stress disorder) 07/13/2019  . Endometriosis determined by laparoscopy 02/11/2017  . PID (acute pelvic inflammatory disease) 11/17/2016  . Pelvic pain 11/15/2016  . Family history of breast cancer in mother 10/21/2016  . Pelvic pain in female 11/10/2015  . Anxiety   . Depression   . Asthma   . Class 3 obesity   . Hypertension   . Thyroid disease     Past Surgical History:  Procedure Laterality Date  . ABDOMINAL HYSTERECTOMY    . APPENDECTOMY    . [place]   x   . [place] OF UTERUS  08/08/2008 & 03/08/2013   2009 missed ab, 2014 bleeding  . LAPAROSCOPIC VAGINAL HYSTERECTOMY WITH SALPINGO OOPHORECTOMY Bilateral 12/06/2017   Procedure: LAPAROSCOPIC ASSISTED VAGINAL HYSTERECTOMY WITH SALPINGO OOPHORECTOMY;  Surgeon: Woodroe Mode, MD;  Location: [facility];  Service: Gynecology;  Laterality: Bilateral;  . LAPAROSCOPY N/A 01/24/2017   Procedure: LAPAROSCOPY DIAGNOSTIC;  Surgeon: Woodroe Mode, MD;  Location: [facility];  Service: Gynecology;  Laterality: N/A;  . SHOULDER SURGERY Left   . THYROIDECTOMY    . WISDOM TOOTH EXTRACTION       OB History    Gravida  10   Para  2   Term  2   Preterm  0   AB  8   Living  2     SAB  1   IAB  7   Ectopic      Multiple      Live Births  2           Family History  Problem Relation Age of Onset  . Cancer Mother 82       Breast  . Depression Mother   . Hearing loss Mother   . Hypertension Mother   . Stroke Mother   . Breast cancer Mother   . Asthma Brother   . Birth defects Sister     Social History   Tobacco Use  . Smoking status: Never Smoker  . Smokeless tobacco: Never Used  Vaping Use  . Vaping Use: Never used  Substance Use Topics  . Alcohol use: No  . Drug use: No    Home Medications Prior to Admission medications   Medication Sig Start Date End Date Taking? Authorizing Provider   acetaminophen (TYLENOL) 500 MG tablet Take 1,000 mg by mouth every 6 (six) hours as needed for moderate pain or headache.   Yes [provider]  Albuterol Sulfate (PROAIR RESPICLICK) 482 (90 Base) MCG/ACT AEPB Inhale 2 puffs into the lungs every 6 (six) hours. As needed for wheezing or shortness of breath 06/13/18  Yes Dena Billet B, PA-C  amLODipine (NORVASC) 5 MG tablet Take 1 tablet (5 mg total) by mouth daily. 07/11/20  Yes [place], Modena Nunnery, MD  benazepril (LOTENSIN) 20 MG tablet Take 1 tablet (20 mg total) by mouth daily. 07/11/20  Yes [place], Modena Nunnery, MD  clonazePAM (KLONOPIN) 1 MG tablet Take 1 tablet (1 mg total) by mouth 2 (two) times daily as needed for anxiety (sleep). 09/11/20 01/09/21 Yes Pucilowski, Olgierd A, MD  fluticasone (FLONASE) 50 MCG/ACT nasal spray Place 1 spray into both nostrils 2 (two) times daily as needed for allergies or rhinitis. 11/30/19  Yes [place], Modena Nunnery, MD  levothyroxine (SYNTHROID) 112 MCG tablet Take 1 tablet (112 mcg total) by mouth daily. 07/11/20  Yes [place], Modena Nunnery, MD  metFORMIN (GLUCOPHAGE) 500 MG tablet Take 1 tablet (500 mg total) by mouth daily with breakfast. 07/11/20  Yes [place], Modena Nunnery, MD  Multiple Vitamins-Calcium (ONE-A-DAY WITHIN PO) Take 1 tablet by mouth daily.   Yes [provider]  prazosin (MINIPRESS) 2 MG capsule Take 1 capsule (2 mg total) by mouth 2 (two) times daily. 09/11/20 03/10/21 Yes Pucilowski, Marchia Bond, MD  SUMAtriptan (IMITREX) 100 MG tablet Take 1/2 to 1 full tablet at onset of headache Patient taking differently: Take 100 mg by mouth every 2 (two) hours as needed for migraine. 10/01/20  Yes [place], Modena Nunnery, MD  Tetrahydrozoline HCl (VISINE OP) Place 1 Drop/kg into both eyes daily as needed (dry eyes).   Yes [provider]  venlafaxine XR (EFFEXOR-XR) 75 MG 24 hr capsule Take 3 capsules (225 mg total) by mouth daily with breakfast. 09/11/20 03/10/21 Yes Pucilowski, Olgierd A, MD  ibuprofen (ADVIL) 800 MG  tablet Take 1 tablet (800 mg total) by mouth 3 (three) times daily. 10/20/20   Garald Balding, PA-C  methocarbamol (ROBAXIN) 500 MG tablet Take 1 tablet (500 mg total) by mouth 2 (two) times daily. 10/20/20   Garald Balding, PA-C  metroNIDAZOLE (FLAGYL) 500 MG tablet Take 1 tablet (500 mg total) by mouth 2 (two) times daily. Patient not taking: Reported on 10/20/2020 10/01/20  [place], Modena Nunnery, MD    Allergies    Ivp dye [iodinated diagnostic agents], Latex, Morphine and related, Oxycodone, Penicillins, and Percocet [oxycodone-acetaminophen]  Review of Systems   Review of Systems  Constitutional: Negative for chills and fever.  HENT: Negative for ear pain and sore throat.   Eyes: Negative for pain and visual disturbance.  Respiratory: Positive for shortness of breath. Negative for cough.   Cardiovascular: Negative for chest pain and palpitations.  Gastrointestinal: Negative for abdominal pain and vomiting.  Genitourinary: Negative for dysuria and hematuria.  Musculoskeletal: Positive for back pain. Negative for arthralgias.  Skin: Negative for color change and rash.  Neurological: Negative for seizures and syncope.  All other systems reviewed and are negative.   Physical Exam Updated Vital Signs BP 124/86 (BP Location: Right Arm)   Pulse 75   Temp 98.4 F (36.9 C) (Oral)   Resp 15   Ht 5\' 2"  (1.575 m)   Wt 129.7 kg   LMP 09/30/2017   SpO2 98%   BMI 52.31 kg/m   Physical Exam Vitals and nursing note reviewed.  Constitutional:      General: She is not in acute distress.    Appearance: She is well-developed and well-nourished. She is obese.  HENT:     Head: Normocephalic and atraumatic.  Eyes:     Conjunctiva/sclera: Conjunctivae normal.  Cardiovascular:     Rate and Rhythm: Normal rate and regular rhythm.     Heart sounds: No murmur heard.   Pulmonary:     Effort: Pulmonary effort is normal. No respiratory distress.     Breath sounds: Normal breath sounds.   Abdominal:     General: Abdomen is flat.     Palpations: Abdomen is soft.     Tenderness: There is abdominal tenderness. There is right CVA tenderness. There is no left CVA tenderness.     Comments: Tenderness to palpation under the right breast.  Patient unable to take a deep breath to perform Murphy sign.  Musculoskeletal:        General: Tenderness present. No swelling, deformity or edema.     Cervical back: Normal range of motion and neck supple.     Right lower leg: No edema.     Left lower leg: No edema.     Comments: Tenderness to palpation the paraspinal musculature of the right low back.  No midline tenderness to the C, T, L-spine.  No step-offs or crepitus.  No overlying erythema, fluctuance.  Skin:    General: Skin is warm and dry.     Capillary Refill: Capillary refill takes less than 2 seconds.     Findings: No erythema.  Neurological:     General: No focal deficit present.     Mental Status: She is alert and oriented to person, place, and time.     Sensory: No sensory deficit.     Motor: No weakness.  Psychiatric:        Mood and Affect: Mood and affect and mood normal.        Behavior: Behavior normal.     ED Results / Procedures / Treatments   Labs (all labs ordered are listed, but only abnormal results are displayed) Labs Reviewed  URINALYSIS, ROUTINE W REFLEX MICROSCOPIC - Abnormal; Notable for the following components:      Result Value   APPearance HAZY (*)    All other components within normal limits  BASIC METABOLIC PANEL - Abnormal; Notable for the following components:  CO2 21 (*)    Glucose, Bld 123 (*)    Calcium 8.8 (*)    All other components within normal limits  D-DIMER, QUANTITATIVE (NOT AT [facility]) - Abnormal; Notable for the following components:   D-Dimer, Quant 0.93 (*)    All other components within normal limits  HEPATIC FUNCTION PANEL - Abnormal; Notable for the following components:   Bilirubin, Direct 0.4 (*)    All other components  within normal limits  CBC WITH DIFFERENTIAL/PLATELET  LIPASE, BLOOD  TROPONIN I (HIGH SENSITIVITY)  TROPONIN I (HIGH SENSITIVITY)    EKG EKG Interpretation  Date/Time:  Monday October 20 2020 07:45:16 EST Ventricular Rate:  82 PR Interval:    QRS Duration: 108 QT Interval:  391 QTC Calculation: 457 R Axis:   39 Text Interpretation: Sinus rhythm Low voltage, precordial leads Abnormal R-wave progression, early transition Borderline T abnormalities, diffuse leads Confirmed by Dewaine Conger [phone]) on 10/20/2020 8:07:18 AM   Radiology NM Pulmonary Perfusion  Result Date: 10/20/2020 CLINICAL DATA:  44 year old female with chest pain and cough. EXAM: NUCLEAR MEDICINE PERFUSION LUNG SCAN TECHNIQUE: Perfusion images were obtained in multiple projections after intravenous injection of radiopharmaceutical. Ventilation scans intentionally deferred if perfusion scan and chest x-ray adequate for interpretation during COVID 19 epidemic. RADIOPHARMACEUTICALS:  4.4 mCi mCi Tc-95m MAA IV COMPARISON:  CT Abdomen and Pelvis and portable chest radiograph earlier today. FINDINGS: Homogeneous perfusion radiotracer activity throughout both lungs. No perfusion defect. IMPRESSION: No perfusion defect, negative for pulmonary embolus. Electronically Signed   By: Genevie Ann M.D.   On: 10/20/2020 14:05   DG Chest Portable 1 View  Result Date: 10/20/2020 CLINICAL DATA:  Shortness of breath. EXAM: PORTABLE CHEST 1 VIEW COMPARISON:  03/04/2018 FINDINGS: 0653 hours. Low lung volumes. The cardio pericardial silhouette is enlarged. There is pulmonary vascular congestion without overt pulmonary edema. Mild interstitial pulmonary edema suspected. The visualized bony structures of the thorax show no acute abnormality. Telemetry leads overlie the chest. IMPRESSION: Low volume film with vascular congestion and probable mild interstitial pulmonary edema. Electronically Signed   By: Misty Stanley M.D.   On: 10/20/2020 07:22   CT  Renal Stone Study  Result Date: 10/20/2020 CLINICAL DATA:  Sudden onset right flank pain. EXAM: CT ABDOMEN AND PELVIS WITHOUT CONTRAST TECHNIQUE: Multidetector CT imaging of the abdomen and pelvis was performed following the standard protocol without IV contrast. COMPARISON:  05/23/2009 FINDINGS: Lower chest: Unremarkable. Hepatobiliary: No focal abnormality in the liver on this study without intravenous contrast. Multiple gallstones identified measuring up to at least 10 mm diameter. No intrahepatic or extrahepatic biliary dilation. Pancreas: No focal mass lesion. No dilatation of the main duct. No intraparenchymal cyst. No peripancreatic edema. Spleen: No splenomegaly. No focal mass lesion. Adrenals/Urinary Tract: No adrenal nodule or mass. 6 mm hypoattenuating lesion lower pole right kidney is too small to characterize. 5.1 cm water density lesion lower pole left kidney has increased from 2.4 cm on the prior study, compatible with cyst. There is a small associated exophytic component posteriorly measuring 12 mm increased from 5 mm previously. 14 mm hypodensity posterior interpolar left kidney cannot be definitively characterized. Ill-defined hypoattenuating lesion in the upper pole left kidney was present previously, likely a cyst. No stones are visible in either kidney or ureter. No secondary changes in either kidney or ureter. No bladder stones. Stomach/Bowel: Stomach is unremarkable. No gastric wall thickening. No evidence of outlet obstruction. Duodenum is normally positioned as is the ligament of Treitz. No small bowel  wall thickening. No small bowel dilatation. The terminal ileum is normal. Nonvisualization of the appendix is consistent with the reported history of appendectomy. No gross colonic mass. No colonic wall thickening. Vascular/Lymphatic: No abdominal aortic aneurysm. No abdominal aortic atherosclerotic calcification. There is no gastrohepatic or hepatoduodenal ligament lymphadenopathy. No  retroperitoneal or mesenteric lymphadenopathy. No pelvic sidewall lymphadenopathy. Reproductive: The uterus is surgically absent. There is no adnexal mass. Other: No intraperitoneal free fluid. Musculoskeletal: No worrisome lytic or sclerotic osseous abnormality. IMPRESSION: 1. No acute findings in the abdomen or pelvis. Specifically, no findings to explain the patient's history of right flank pain. No urinary stone disease. No secondary changes in either kidney or ureter. 2. Cholelithiasis. Electronically Signed   By: Misty Stanley M.D.   On: 10/20/2020 09:15   US Abdomen Limited RUQ (LIVER/GB)  Result Date: 10/20/2020 CLINICAL DATA:  Upper abdominal pain EXAM: ULTRASOUND ABDOMEN LIMITED RIGHT UPPER QUADRANT COMPARISON:  None. FINDINGS: Gallbladder: Within the gallbladder, there are multiple echogenic foci which move and shadow consistent with cholelithiasis. No gallbladder wall thickening or pericholecystic fluid. There is no intrahepatic or extrahepatic biliary duct dilatation. No sonographic Murphy sign noted by sonographer. Common bile duct: Diameter: 4 mm. No intrahepatic or extrahepatic biliary duct dilatation. Liver: No focal lesion identified. Within normal limits in parenchymal echogenicity. Portal vein is patent on color Doppler imaging with normal direction of blood flow towards the liver. Other: None. IMPRESSION: Cholelithiasis. No gallbladder wall thickening or pericholecystic fluid. Study otherwise unremarkable. Electronically Signed   By: Lowella Grip III M.D.   On: 10/20/2020 08:16    Procedures Procedures (including critical care time)  Medications Ordered in ED Medications  ketorolac (TORADOL) injection 60 mg (has no administration in time range)  fentaNYL (SUBLIMAZE) injection 50 mcg (50 mcg Intravenous Given 10/20/20 0750)  fentaNYL (SUBLIMAZE) injection 50 mcg (50 mcg Intravenous Given 10/20/20 1058)  methocarbamol (ROBAXIN) tablet 500 mg (500 mg Oral Given 10/20/20 1058)   technetium albumin aggregated (MAA) injection solution 4.4 millicurie (4.4 millicuries Intravenous Contrast Given 10/20/20 1235)    ED Course  I have reviewed the triage vital signs and the nursing notes.  Pertinent labs & imaging results that were available during my care of the patient were reviewed by me and considered in my medical decision making (see chart for details).    MDM Rules/Calculators/A&P                          44 year old female with complaints of some pain under her right breast and some right flank pain. On presentation, she is uncomfortable appearing, however nontoxic, nondiaphoretic, in no acute distress, resting comfortably in the ER bed.  Vitals on arrival with some hypertension but afebrile, not tachycardic, tachypneic or hypoxic.  Not ill-appearing.  Physical exam with tenderness to palpation of the right flank and under the right breast over the rib cage.  DDx includes musculoskeletal strain, gallstones, kidney stones, ACS, PE, dissection  Labs reviewed and interpreted by me -BMP without any electrolyte abnormalities, mild hypocalcemia noted.  Normal liver function tests -UA without evidence of blood or infection -CBC without leukocytosis, normal hemoglobin -Delta troponin negative -D-dimer elevated at 0.93  EKG without evidence of ischemia  MDM: Discussed case my supervising physician Dr. Ron Parker.  Given some right upper quadrant chest wall pain and back pain, as well as the patient being in the prime risk factor group, ordered a right upper quadrant ultrasound to evaluate for gallstones.  Some  cholelithiasis was noted without cholecystitis.  No ductal dilation noted.  Given right flank pain, also concern for renal stones.  Renal stone study was negative.  Patient has a anaphylactic reaction to IV dye.  Given this, CT PE study cannot be ordered.  VQ scan performed here in the ED without evidence of PE.  Upon reevaluation, patient notes slight improvement in  pain but not significantly.  She had received 100 mg of fentanyl.  Per chart review and patient confirmation, she is allergic to oxycodone and morphine related medications.  I explained the extensive work-up done to the patient and how all of her scans were reassuring.  No evidence of gallstones, kidney stones, low suspicion for dissection.  Suspect there may be a musculoskeletal element given the pain is reproducible with palpation. Pt ambulated in the ED.  Encouraged the patient to take ibuprofen, Robaxin.  Patient was educated on sedating side effects of medication and instructed to take this medication before bed, to not take or drive on it.  Encouraged PCP follow-up for further evaluation for symptoms do not improve.  Work note provided as per patient request.  Return precautions discussed.  All of her questions about is at her satisfaction, she voices understanding and is agreeable.  Stable for discharge at this time.  Case discussed with my supervising physician Dr. Ron Parker who is agreeable to the above plan and disposition.    Final Clinical Impression(s) / ED Diagnoses Final diagnoses:  RUQ abdominal pain  Right flank pain    Rx / DC Orders ED Discharge Orders         Ordered    ibuprofen (ADVIL) 800 MG tablet  3 times daily        10/20/20 1424    methocarbamol (ROBAXIN) 500 MG tablet  2 times daily        10/20/20 1424               Lyndel Safe 10/20/20 1427    Breck Coons, MD 10/21/20 [phone]

## 2020-10-20 NOTE — ED Notes (Signed)
Pt discharged ambulatory with family. All questions and concerns addressed. No complaints at this time.

## 2020-10-20 NOTE — ED Notes (Signed)
Patient transported to CT 

## 2020-10-20 NOTE — ED Triage Notes (Signed)
Patient arrived with EMS from work reports sudden onset right flank pain this morning , denies injury or fall , no hematuria or dysuria , denies fever or chills.

## 2020-10-21 ENCOUNTER — Ambulatory Visit (INDEPENDENT_AMBULATORY_CARE_PROVIDER_SITE_OTHER): Payer: Self-pay | Admitting: Family Medicine

## 2020-10-21 ENCOUNTER — Encounter: Payer: Self-pay | Admitting: Family Medicine

## 2020-10-21 DIAGNOSIS — M545 Low back pain, unspecified: Secondary | ICD-10-CM

## 2020-10-21 DIAGNOSIS — G8929 Other chronic pain: Secondary | ICD-10-CM

## 2020-10-21 MED ORDER — VITAMIN D-3 125 MCG (5000 UT) PO TABS: 1.0000 | ORAL_TABLET | Freq: Every day | ORAL | 3 refills | Status: DC

## 2020-10-21 MED ORDER — CELECOXIB 200 MG PO CAPS: 200.0000 mg | ORAL_CAPSULE | Freq: Two times a day (BID) | ORAL | 6 refills | Status: DC | PRN

## 2020-10-21 MED ORDER — BACLOFEN 10 MG PO TABS: 5.0000 mg | ORAL_TABLET | Freq: Every evening | ORAL | 3 refills | Status: DC | PRN
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
## 2020-10-22 ENCOUNTER — Ambulatory Visit: Payer: Self-pay | Admitting: Physical Therapy

## 2020-11-11 ENCOUNTER — Ambulatory Visit: Payer: Self-pay | Admitting: Physical Therapy

## 2020-12-01 ENCOUNTER — Other Ambulatory Visit: Payer: Self-pay | Admitting: Family Medicine

## 2020-12-10 ENCOUNTER — Ambulatory Visit
Admission: EM | Admit: 2020-12-10 | Discharge: 2020-12-10 | Disposition: A | Discharge status: Home / Self Care | Payer: Self-pay | Attending: Emergency Medicine | Admitting: Emergency Medicine

## 2020-12-10 ENCOUNTER — Other Ambulatory Visit: Payer: Self-pay

## 2020-12-10 DIAGNOSIS — T7840XA Allergy, unspecified, initial encounter: Secondary | ICD-10-CM

## 2020-12-10 DIAGNOSIS — J309 Allergic rhinitis, unspecified: Secondary | ICD-10-CM

## 2020-12-10 MED ORDER — LORATADINE 10 MG PO TABS: 10.0000 mg | ORAL_TABLET | Freq: Every day | ORAL | 0 refills | Status: DC

## 2020-12-10 MED ORDER — FAMOTIDINE 20 MG PO TABS: 20.0000 mg | ORAL_TABLET | Freq: Two times a day (BID) | ORAL | 0 refills | Status: DC

## 2020-12-10 MED ORDER — EPINEPHRINE 0.3 MG/0.3ML IJ SOAJ: 0.3000 mg | Freq: Once | INTRAMUSCULAR | 0 refills | Status: AC

## 2020-12-10 NOTE — Discharge Instructions (Signed)
May take Pepcid and Claritin together as written for the next 5 days.  Use the EpiPen for any signs of anaphylaxis and go immediately to the emergency department

## 2020-12-10 NOTE — ED Triage Notes (Signed)
Patient states she took sumatriptan for the first time and her throat started burning. Pt took some benadryl and has gotten some relief from the burning. Pt is aox4 and ambulatory.

## 2020-12-10 NOTE — ED Provider Notes (Signed)
HPI  SUBJECTIVE:  Janice Roberts is a 45 y.o. female who presents with face/throat burning 30 minutes after taking Imitrex for the first time.  She denies flushing, throat or tongue swelling, wheezing, shortness of breath, nausea, vomiting, abdominal pain, diarrhea, syncope, rash, urticaria.  She tried 25 mg of Benadryl with improvement in her symptoms.  No aggravating factors.  She also reports 2 days of nasal congestion.  She has a past medical history of asthma, hypertension, seasonal allergies.  No history of anaphylaxis, diabetes.  LMP: Status post hysterectomy.  ZOX:WRUEAVPMD:[place], Velna HatchetKawanta F, MD   Past Medical History:  Diagnosis Date  . Anxiety    no meds  . Asthma    rarely uses inhaler - seasonal with allergies  . Depression    no med  . Hypertension   . Hypothyroidism   . Obesity   . Seasonal allergies   . SVD (spontaneous vaginal delivery) 1997   x   . Thyroid disease     Past Surgical History:  Procedure Laterality Date  . ABDOMINAL HYSTERECTOMY    . APPENDECTOMY    . CESAREAN SECTION  1993   x   . DILATION AND CURETTAGE OF UTERUS  08/08/2008 & 03/08/2013   2009 missed ab, 2014 bleeding  . LAPAROSCOPIC VAGINAL HYSTERECTOMY WITH SALPINGO OOPHORECTOMY Bilateral 12/06/2017   Procedure: LAPAROSCOPIC ASSISTED VAGINAL HYSTERECTOMY WITH SALPINGO OOPHORECTOMY;  Surgeon: Adam PhenixArnold, James G, MD;  Location: [facility];  Service: Gynecology;  Laterality: Bilateral;  . LAPAROSCOPY N/A 01/24/2017   Procedure: LAPAROSCOPY DIAGNOSTIC;  Surgeon: Adam PhenixJames G Arnold, MD;  Location: [facility];  Service: Gynecology;  Laterality: N/A;  . SHOULDER SURGERY Left   . THYROIDECTOMY    . WISDOM TOOTH EXTRACTION      Family History  Problem Relation Age of Onset  . Cancer Mother [id number]       Breast  . Depression Mother   . Hearing loss Mother   . Hypertension Mother   . Stroke Mother   . Breast cancer Mother   . Asthma Brother   . Birth defects Sister     Social History   Tobacco Use  . Smoking status:  Never Smoker  . Smokeless tobacco: Never Used  Vaping Use  . Vaping Use: Never used  Substance Use Topics  . Alcohol use: No  . Drug use: No    No current facility-administered medications for this encounter.  Current Outpatient Medications:  .  acetaminophen (TYLENOL) 500 MG tablet, Take 1,000 mg by mouth every 6 (six) hours as needed for moderate pain or headache., Disp: , Rfl:  .  amLODipine (NORVASC) 5 MG tablet, Take 1 tablet (5 mg total) by mouth daily., Disp: 90 tablet, Rfl: 0 .  benazepril (LOTENSIN) 20 MG tablet, Take 1 tablet (20 mg total) by mouth daily., Disp: 90 tablet, Rfl: 0 .  Cholecalciferol (VITAMIN D-3) 125 MCG (5000 UT) TABS, Take 1 tablet by mouth daily., Disp: 90 tablet, Rfl: 3 .  famotidine (PEPCID) 20 MG tablet, Take 1 tablet (20 mg total) by mouth 2 (two) times daily., Disp: 10 tablet, Rfl: 0 .  fluticasone (FLONASE) 50 MCG/ACT nasal spray, Place 1 spray into both nostrils 2 (two) times daily as needed for allergies or rhinitis., Disp: 16 g, Rfl: 2 .  ibuprofen (ADVIL) 800 MG tablet, Take 1 tablet (800 mg total) by mouth 3 (three) times daily., Disp: 21 tablet, Rfl: 0 .  levothyroxine (SYNTHROID) 112 MCG tablet, Take 1 tablet by mouth once daily,  Disp: 90 tablet, Rfl: 0 .  loratadine (CLARITIN) 10 MG tablet, Take 1 tablet (10 mg total) by mouth daily., Disp: 10 tablet, Rfl: 0 .  metFORMIN (GLUCOPHAGE) 500 MG tablet, Take 1 tablet (500 mg total) by mouth daily with breakfast., Disp: 90 tablet, Rfl: 0 .  Multiple Vitamins-Calcium (ONE-A-DAY WITHIN PO), Take 1 tablet by mouth daily., Disp: , Rfl:  .  Tetrahydrozoline HCl (VISINE OP), Place 1 Drop/kg into both eyes daily as needed (dry eyes)., Disp: , Rfl:  .  Albuterol Sulfate (PROAIR RESPICLICK) 314 (90 Base) MCG/ACT AEPB, Inhale 2 puffs into the lungs every 6 (six) hours. As needed for wheezing or shortness of breath, Disp: 1 each, Rfl: 2 .  baclofen (LIORESAL) 10 MG tablet, Take 0.5-1 tablets (5-10 mg total) by mouth  at bedtime as needed for muscle spasms., Disp: 30 each, Rfl: 3 .  celecoxib (CELEBREX) 200 MG capsule, Take 1 capsule (200 mg total) by mouth 2 (two) times daily as needed., Disp: 60 capsule, Rfl: 6 .  clonazePAM (KLONOPIN) 1 MG tablet, Take 1 tablet (1 mg total) by mouth 2 (two) times daily as needed for anxiety (sleep)., Disp: 60 tablet, Rfl: 3 .  methocarbamol (ROBAXIN) 500 MG tablet, Take 1 tablet (500 mg total) by mouth 2 (two) times daily., Disp: 20 tablet, Rfl: 0 .  prazosin (MINIPRESS) 2 MG capsule, Take 1 capsule (2 mg total) by mouth 2 (two) times daily., Disp: 180 capsule, Rfl: 1 .  venlafaxine XR (EFFEXOR-XR) 75 MG 24 hr capsule, Take 3 capsules (225 mg total) by mouth daily with breakfast., Disp: 90 capsule, Rfl: 5  Allergies  Allergen Reactions  . Ivp Dye [Iodinated Diagnostic Agents] Anaphylaxis and Other (See Comments)    "Almost died"  . Latex Swelling and Other (See Comments)    irritation  . Morphine And Related Hives  . Oxycodone Hives  . Penicillins Hives and Other (See Comments)    Has patient had a PCN reaction causing immediate rash, facial/tongue/throat swelling, SOB or lightheadedness with hypotension: No Has patient had a PCN reaction causing severe rash involving mucus membranes or skin necrosis: Yes Has patient had a PCN reaction that required hospitalization No Has patient had a PCN reaction occurring within the last 10 years: No If all of the above answers are "NO", then may proceed with Cephalosporin use.   Marland Kitchen Percocet [Oxycodone-Acetaminophen] Itching     ROS  As noted in HPI.   Physical Exam  BP 138/89 (BP Location: Left Arm)   Pulse 81   Temp 97.9 F (36.6 C) (Oral)   Resp 20   LMP 09/30/2017   SpO2 95%   Constitutional: Well developed, well nourished, no acute distress Eyes:  EOMI, conjunctiva normal bilaterally HENT: Normocephalic, atraumatic,mucus membranes moist.  No angioedema of the lips or tongue.  Airway widely patent.  Positive  clear nasal congestion. Respiratory: Normal inspiratory effort, lungs clear bilaterally, good air movement Cardiovascular: Normal rate no murmurs rubs or gallop GI: nondistended skin: No flushing, urticaria Musculoskeletal: no deformities Neurologic: Alert & oriented x 3, no focal neuro deficits Psychiatric: Speech and behavior appropriate   ED Course   Medications - No data to display  No orders of the defined types were placed in this encounter.   No results found for this or any previous visit (from the past 24 hour(s)). No results found.  ED Clinical Impression  1. Allergic reaction, initial encounter   2. Allergic rhinitis, unspecified seasonality, unspecified trigger  ED Assessment/Plan  Patient with a  mild allergic reaction.  There is no evidence of anaphylaxis.  Will send with 5 days of Claritin or Zyrtec, Pepcid.  prescription for EpiPen in case things get worse.  Patient states she is allergic to steroids.  As for the nasal congestion she has seasonal allergies suspect allergic rhinitis.  Continue Flonase and start saline nasal irrigation [employer] sinus rinse and distilled water as often as she wants.  Discussed  MDM, treatment plan, and plan for follow-up with patient. Discussed sn/sx that should prompt return to the ED. patient agrees with plan.   Meds ordered this encounter  Medications  . famotidine (PEPCID) 20 MG tablet    Sig: Take 1 tablet (20 mg total) by mouth 2 (two) times daily.    Dispense:  10 tablet    Refill:  0  . loratadine (CLARITIN) 10 MG tablet    Sig: Take 1 tablet (10 mg total) by mouth daily.    Dispense:  10 tablet    Refill:  0  . EPINEPHrine 0.3 mg/0.3 mL IJ SOAJ injection    Sig: Inject 0.3 mg into the muscle once for 1 dose.    Dispense:  0.3 mL    Refill:  0    *This clinic note was created using [employer]. Therefore, there may be occasional mistakes despite careful proofreading.   ?    Melynda Ripple, MD 12/11/20 [phone]

## 2020-12-15 ENCOUNTER — Encounter: Payer: Self-pay | Admitting: Nurse Practitioner

## 2020-12-15 ENCOUNTER — Other Ambulatory Visit: Payer: Self-pay

## 2020-12-15 ENCOUNTER — Ambulatory Visit (INDEPENDENT_AMBULATORY_CARE_PROVIDER_SITE_OTHER): Payer: Self-pay | Admitting: Nurse Practitioner

## 2020-12-15 VITALS — BP 124/80 | HR 79 | Temp 97.9°F | Ht 62.0 in | Wt 257.0 lb

## 2020-12-15 DIAGNOSIS — J302 Other seasonal allergic rhinitis: Secondary | ICD-10-CM

## 2020-12-15 DIAGNOSIS — J0110 Acute frontal sinusitis, unspecified: Secondary | ICD-10-CM

## 2020-12-15 DIAGNOSIS — R0981 Nasal congestion: Secondary | ICD-10-CM

## 2020-12-15 DIAGNOSIS — J4520 Mild intermittent asthma, uncomplicated: Secondary | ICD-10-CM

## 2020-12-15 MED ORDER — LORATADINE 10 MG PO TABS
10.0000 mg | ORAL_TABLET | Freq: Every day | ORAL | 0 refills | Status: DC
Start: 2020-12-15 — End: 2021-01-27

## 2020-12-15 MED ORDER — FLUTICASONE PROPIONATE 50 MCG/ACT NA SUSP: 1.0000 | Freq: Two times a day (BID) | NASAL | 2 refills | Status: DC | PRN

## 2020-12-15 MED ORDER — SALINE SPRAY 0.65 % NA SOLN: 1.0000 | NASAL | 0 refills | Status: DC | PRN

## 2020-12-15 MED ORDER — PREDNISONE 10 MG PO TABS: 10.0000 mg | ORAL_TABLET | Freq: Every day | ORAL | 0 refills | Status: DC

## 2020-12-15 MED ORDER — PROAIR RESPICLICK 108 (90 BASE) MCG/ACT IN AEPB
2.0000 | INHALATION_SPRAY | Freq: Four times a day (QID) | RESPIRATORY_TRACT | 2 refills | Status: DC
Start: 2020-12-15 — End: 2021-01-27

## 2020-12-15 MED ORDER — MOXIFLOXACIN HCL 400 MG PO TABS: 400.0000 mg | ORAL_TABLET | Freq: Every day | ORAL | 0 refills | Status: DC

## 2020-12-15 NOTE — Assessment & Plan Note (Signed)
Chronic, currently exacerbated due to sinusitis.  Refills of claritin and flonase given.  Patient to let us know if symptoms persist despite resuming treatment.  Follow up 6 months.

## 2020-12-15 NOTE — Patient Instructions (Addendum)
F/u in May with Janice Roberts   Sinusitis, Adult Sinusitis is soreness and swelling (inflammation) of your sinuses. Sinuses are hollow spaces in the bones around your face. They are located:  Around your eyes.  In the middle of your forehead.  Behind your nose.  In your cheekbones. Your sinuses and nasal passages are lined with a fluid called mucus. Mucus drains out of your sinuses. Swelling can trap mucus in your sinuses. This lets germs (bacteria, virus, or fungus) grow, which leads to infection. Most of the time, this condition is caused by a virus. What are the causes? This condition is caused by:  Allergies.  Asthma.  Germs.  Things that block your nose or sinuses.  Growths in the nose (nasal polyps).  Chemicals or irritants in the air.  Fungus (rare). What increases the risk? You are more likely to develop this condition if:  You have a weak body defense system (immune system).  You do a lot of swimming or diving.  You use nasal sprays too much.  You smoke. What are the signs or symptoms? The main symptoms of this condition are pain and a feeling of pressure around the sinuses. Other symptoms include:  Stuffy nose (congestion).  Runny nose (drainage).  Swelling and warmth in the sinuses.  Headache.  Toothache.  A cough that may get worse at night.  Mucus that collects in the throat or the back of the nose (postnasal drip).  Being unable to smell and taste.  Being very tired (fatigue).  A fever.  Sore throat.  Bad breath. How is this diagnosed? This condition is diagnosed based on:  Your symptoms.  Your medical history.  A physical exam.  Tests to find out if your condition is short-term (acute) or long-term (chronic). Your doctor may: ? Check your nose for growths (polyps). ? Check your sinuses using a tool that has a light (endoscope). ? Check for allergies or germs. ? Do imaging tests, such as an MRI or CT scan. How is this  treated? Treatment for this condition depends on the cause and whether it is short-term or long-term.  If caused by a virus, your symptoms should go away on their own within 10 days. You may be given medicines to relieve symptoms. They include: ? Medicines that shrink swollen tissue in the nose. ? Medicines that treat allergies (antihistamines). ? A spray that treats swelling of the nostrils. ? Rinses that help get rid of thick mucus in your nose (nasal saline washes).  If caused by bacteria, your doctor may wait to see if you will get better without treatment. You may be given antibiotic medicine if you have: ? A very bad infection. ? A weak body defense system.  If caused by growths in the nose, you may need to have surgery. Follow these instructions at home: Medicines  Take, use, or apply over-the-counter and prescription medicines only as told by your doctor. These may include nasal sprays.  If you were prescribed an antibiotic medicine, take it as told by your doctor. Do not stop taking the antibiotic even if you start to feel better. Hydrate and humidify  Drink enough water to keep your pee (urine) pale yellow.  Use a cool mist humidifier to keep the humidity level in your home above 50%.  Breathe in steam for 10-15 minutes, 3-4 times a day, or as told by your doctor. You can do this in the bathroom while a hot shower is running.  Try not to spend  time in cool or dry air.   Rest  Rest as much as you can.  Sleep with your head raised (elevated).  Make sure you get enough sleep each night. General instructions  Put a warm, moist washcloth on your face 3-4 times a day, or as often as told by your doctor. This will help with discomfort.  Wash your hands often with soap and water. If there is no soap and water, use hand sanitizer.  Do not smoke. Avoid being around people who are smoking (secondhand smoke).  Keep all follow-up visits as told by your doctor. This is  important.   Contact a doctor if:  You have a fever.  Your symptoms get worse.  Your symptoms do not get better within 10 days. Get help right away if:  You have a very bad headache.  You cannot stop throwing up (vomiting).  You have very bad pain or swelling around your face or eyes.  You have trouble seeing.  You feel confused.  Your neck is stiff.  You have trouble breathing. Summary  Sinusitis is swelling of your sinuses. Sinuses are hollow spaces in the bones around your face.  This condition is caused by tissues in your nose that become inflamed or swollen. This traps germs. These can lead to infection.  If you were prescribed an antibiotic medicine, take it as told by your doctor. Do not stop taking it even if you start to feel better.  Keep all follow-up visits as told by your doctor. This is important. This information is not intended to replace advice given to you by your health care provider. Make sure you discuss any questions you have with your health care provider. Document Revised: 03/27/2018 Document Reviewed: 03/27/2018 Elsevier Patient Education  2021 [employer].

## 2020-12-15 NOTE — Assessment & Plan Note (Signed)
Chronic, ongoing.  Reports she is out of her rescue inhaler, will refill today.  Symptoms likely currently exacerbated due to sinusitis.  Will treat sinusitis with antibiotics and start prednisone taper.  If asthma symptoms do not improve after treatment, consider starting daily inhaler.

## 2020-12-15 NOTE — Progress Notes (Signed)
Subjective:    Patient ID: Janice Roberts, female    DOB: 19-May-1976, 45 y.o.   MRN: [id number]  HPI: Janice Roberts is a 45 y.o. female presenting for hoarse voice and nasal congestion.  Chief Complaint  Patient presents with  . Hoarse    Absolutely no voice for a while  . Nasal Congestion    Itchy eyes and runny nose. No appetite since Monday. Pt has not been flu or covid vaccinated   UPPER RESPIRATORY TRACT INFECTION Onset: 10 days ago  Worst symptom: no voice Fever: no Cough: yes; productive at times Shortness of breath: sometimes Wheezing: sometimes Chest pain: no Chest tightness: no Chest congestion: no Nasal congestion: yes Runny nose: yes Post nasal drip: yes Sneezing: no Sore throat: yes Swollen glands: yes Sinus pressure: yes; all over Headache: yes Face pain: no Toothache: yes Ear pain: yes; pulsating in both sides  Ear pressure: no  Eyes red/itching:no Eye drainage/crusting: yes; watery  Nausea: yes  Vomiting: no Diarrhea: no  Change in appetite: no; decreased since Monday  Loss of taste/smell: no  Rash: no Fatigue: no Sick contacts: no Strep contacts: no  Context: worse Recurrent sinusitis: no Treatments attempted: flonase, Benadryl Relief with OTC medications:  Allergies  Allergen Reactions  . Ivp Dye [Iodinated Diagnostic Agents] Anaphylaxis and Other (See Comments)    "Almost died"  . Latex Swelling and Other (See Comments)    irritation  . Morphine And Related Hives  . Oxycodone Hives  . Penicillins Hives and Other (See Comments)    Has patient had a PCN reaction causing immediate rash, facial/tongue/throat swelling, SOB or lightheadedness with hypotension: No Has patient had a PCN reaction causing severe rash involving mucus membranes or skin necrosis: Yes Has patient had a PCN reaction that required hospitalization No Has patient had a PCN reaction occurring within the last 10 years: No If all of the above answers are "NO",  then may proceed with Cephalosporin use.   Marland Kitchen Percocet [Oxycodone-Acetaminophen] Itching    Outpatient Encounter Medications as of 12/15/2020  Medication Sig  . acetaminophen (TYLENOL) 500 MG tablet Take 1,000 mg by mouth every 6 (six) hours as needed for moderate pain or headache.  Marland Kitchen amLODipine (NORVASC) 5 MG tablet Take 1 tablet (5 mg total) by mouth daily.  . baclofen (LIORESAL) 10 MG tablet Take 0.5-1 tablets (5-10 mg total) by mouth at bedtime as needed for muscle spasms.  . benazepril (LOTENSIN) 20 MG tablet Take 1 tablet (20 mg total) by mouth daily.  . celecoxib (CELEBREX) 200 MG capsule Take 1 capsule (200 mg total) by mouth 2 (two) times daily as needed.  . Cholecalciferol (VITAMIN D-3) 125 MCG (5000 UT) TABS Take 1 tablet by mouth daily.  . clonazePAM (KLONOPIN) 1 MG tablet Take 1 tablet (1 mg total) by mouth 2 (two) times daily as needed for anxiety (sleep).  . famotidine (PEPCID) 20 MG tablet Take 1 tablet (20 mg total) by mouth 2 (two) times daily.  Marland Kitchen ibuprofen (ADVIL) 800 MG tablet Take 1 tablet (800 mg total) by mouth 3 (three) times daily.  Marland Kitchen levothyroxine (SYNTHROID) 112 MCG tablet Take 1 tablet by mouth once daily  . metFORMIN (GLUCOPHAGE) 500 MG tablet Take 1 tablet (500 mg total) by mouth daily with breakfast.  . methocarbamol (ROBAXIN) 500 MG tablet Take 1 tablet (500 mg total) by mouth 2 (two) times daily.  Marland Kitchen moxifloxacin (AVELOX) 400 MG tablet Take 1 tablet (400 mg total) by mouth  daily.  . Multiple Vitamins-Calcium (ONE-A-DAY WITHIN PO) Take 1 tablet by mouth daily.  . prazosin (MINIPRESS) 2 MG capsule Take 1 capsule (2 mg total) by mouth 2 (two) times daily.  . predniSONE (DELTASONE) 10 MG tablet Take 1 tablet (10 mg total) by mouth daily with breakfast. Take 40mg  on days 1-2. Take 30mg  on days 3-4. Take 20mg  on days 5-6. Take 10mg  on days 7-8. Take 5mg  on days 9-10, then stop.  . sodium chloride (OCEAN) 0.65 % SOLN nasal spray Place 1 spray into both nostrils as needed  for congestion.  . Tetrahydrozoline HCl (VISINE OP) Place 1 Drop/kg into both eyes daily as needed (dry eyes).  . venlafaxine XR (EFFEXOR-XR) 75 MG 24 hr capsule Take 3 capsules (225 mg total) by mouth daily with breakfast.  . [DISCONTINUED] Albuterol Sulfate (PROAIR RESPICLICK) 123XX123 (90 Base) MCG/ACT AEPB Inhale 2 puffs into the lungs every 6 (six) hours. As needed for wheezing or shortness of breath  . [DISCONTINUED] fluticasone (FLONASE) 50 MCG/ACT nasal spray Place 1 spray into both nostrils 2 (two) times daily as needed for allergies or rhinitis.  . [DISCONTINUED] loratadine (CLARITIN) 10 MG tablet Take 1 tablet (10 mg total) by mouth daily.  . Albuterol Sulfate (PROAIR RESPICLICK) 123XX123 (90 Base) MCG/ACT AEPB Inhale 2 puffs into the lungs every 6 (six) hours. As needed for wheezing or shortness of breath  . fluticasone (FLONASE) 50 MCG/ACT nasal spray Place 1 spray into both nostrils 2 (two) times daily as needed for allergies or rhinitis.  Marland Kitchen loratadine (CLARITIN) 10 MG tablet Take 1 tablet (10 mg total) by mouth daily.  . [DISCONTINUED] SUMAtriptan (IMITREX) 100 MG tablet Take 1/2 to 1 full tablet at onset of headache (Patient taking differently: Take 100 mg by mouth every 2 (two) hours as needed for migraine.)   No facility-administered encounter medications on file as of 12/15/2020.    Patient Active Problem List   Diagnosis Date Noted  . Migraines 10/02/2020  . Other insomnia 02/27/2020  . Snoring 02/27/2020  . Sicca syndrome, unspecified 01/30/2020  . Mood disorder ([place]) 01/30/2020  . Panic disorder 12/13/2019  . Seasonal allergies 11/30/2019  . Borderline diabetic 09/05/2019  . PTSD (post-traumatic stress disorder) 07/13/2019  . Endometriosis determined by laparoscopy 02/11/2017  . PID (acute pelvic inflammatory disease) 11/17/2016  . Pelvic pain 11/15/2016  . Family history of breast cancer in mother 10/21/2016  . Pelvic pain in female 11/10/2015  . Anxiety   . Depression   .  Asthma   . Class 3 obesity   . Hypertension   . Thyroid disease     Past Medical History:  Diagnosis Date  . Anxiety    no meds  . Asthma    rarely uses inhaler - seasonal with allergies  . Depression    no med  . Hypertension   . Hypothyroidism   . Obesity   . Seasonal allergies   . SVD (spontaneous vaginal delivery) 1997   x   . Thyroid disease    Relevant past medical, surgical, family and social history reviewed and updated as indicated. Interim medical history since our last visit reviewed.  Review of Systems Per HPI unless specifically indicated above     Objective:    BP 124/80   Pulse 79   Temp 97.9 F (36.6 C)   Ht 5\' 2"  (1.575 m)   Wt 257 lb (116.6 kg)   LMP 09/30/2017   SpO2 98%   BMI 47.01 kg/m  Wt Readings from Last 3 Encounters:  12/15/20 257 lb (116.6 kg)  10/20/20 286 lb (129.7 kg)  10/01/20 262 lb (118.8 kg)    Physical Exam Vitals and nursing note reviewed.  Constitutional:      General: She is not in acute distress.    Appearance: Normal appearance. She is obese. She is not toxic-appearing.  HENT:     Head: Normocephalic and atraumatic.     Right Ear: Tympanic membrane, ear canal and external ear normal.     Left Ear: Tympanic membrane, ear canal and external ear normal.     Nose: Congestion present. No rhinorrhea.     Right Turbinates: Swollen.     Left Turbinates: Swollen.     Right Sinus: Frontal sinus tenderness present. No maxillary sinus tenderness.     Left Sinus: Frontal sinus tenderness present. No maxillary sinus tenderness.     Mouth/Throat:     Mouth: Mucous membranes are moist.     Tongue: No lesions. Tongue does not deviate from midline.     Palate: No mass and lesions.     Pharynx: Oropharynx is clear. Posterior oropharyngeal erythema present.     Tonsils: No tonsillar exudate or tonsillar abscesses. 2+ on the right. 2+ on the left.  Eyes:     General: No scleral icterus.       Right eye: No discharge.        Left  eye: No discharge.     Extraocular Movements: Extraocular movements intact.  Cardiovascular:     Heart sounds: Normal heart sounds.  Pulmonary:     Effort: Pulmonary effort is normal. No respiratory distress.     Breath sounds: Normal breath sounds. No wheezing, rhonchi or rales.  Abdominal:     General: Abdomen is flat. Bowel sounds are normal. There is no distension.     Palpations: Abdomen is soft.  Musculoskeletal:     Cervical back: Normal range of motion.  Lymphadenopathy:     Cervical: Cervical adenopathy present.  Skin:    General: Skin is warm and dry.     Capillary Refill: Capillary refill takes less than 2 seconds.     Coloration: Skin is not jaundiced or pale.     Findings: No erythema.  Neurological:     Mental Status: She is alert and oriented to person, place, and time.     Comments: Patient communicating with pen/paper due to hoarseness  Psychiatric:        Mood and Affect: Mood normal.        Behavior: Behavior normal.        Thought Content: Thought content normal.        Judgment: Judgment normal.        Assessment & Plan:   Problem List Items Addressed This Visit      Respiratory   Asthma    Chronic, ongoing.  Reports she is out of her rescue inhaler, will refill today.  Symptoms likely currently exacerbated due to sinusitis.  Will treat sinusitis with antibiotics and start prednisone taper.  If asthma symptoms do not improve after treatment, consider starting daily inhaler.      Relevant Medications   Albuterol Sulfate (PROAIR RESPICLICK) 161 (90 Base) MCG/ACT AEPB   predniSONE (DELTASONE) 10 MG tablet     Other   Seasonal allergies    Chronic, currently exacerbated due to sinusitis.  Refills of claritin and flonase given.  Patient to let us know if symptoms persist despite resuming treatment.  Follow up 6 months.      Relevant Medications   loratadine (CLARITIN) 10 MG tablet   fluticasone (FLONASE) 50 MCG/ACT nasal spray    Other Visit  Diagnoses    Acute non-recurrent frontal sinusitis    -  Primary   Relevant Medications   loratadine (CLARITIN) 10 MG tablet   fluticasone (FLONASE) 50 MCG/ACT nasal spray   sodium chloride (OCEAN) 0.65 % SOLN nasal spray   predniSONE (DELTASONE) 10 MG tablet   moxifloxacin (AVELOX) 400 MG tablet   Nasal congestion       Relevant Medications   sodium chloride (OCEAN) 0.65 % SOLN nasal spray   Other Relevant Orders   SARS-COV-2 RNA,(COVID-19) QUAL NAAT     Acute non-recurrent frontal sinusitis Ongoing for the past 10 days or so.  COVID testing obtained today; encouraged to continue to isolate until symptoms are improving.  Note given for work.  Will start fluoroquinolone given hive reaction to penicillin and prednisone to help with inflammation in sinuses.  Refills given of allergic rhinitis regimen, rescue inhaler refilled.  Start nasal rinses, push hydration.  With any sudden onset new chest pain, dizziness, sweating, or shortness of breath, go to ED.  Follow up if symptoms do not gradually improve over next week.  - predniSONE (DELTASONE) 10 MG tablet; Take 1 tablet (10 mg total) by mouth daily with breakfast. Take 40mg  on days 1-2. Take 30mg  on days 3-4. Take 20mg  on days 5-6. Take 10mg  on days 7-8. Take 5mg  on days 9-10, then stop.  Dispense: 21 tablet; Refill: 0 - moxifloxacin (AVELOX) 400 MG tablet; Take 1 tablet (400 mg total) by mouth daily.  Dispense: 7 tablet; Refill: 0   Follow up plan: Return if symptoms worsen or fail to improve.

## 2020-12-16 LAB — SARS-COV-2 RNA,(COVID-19) QUALITATIVE NAAT: SARS CoV2 RNA: NOT DETECTED

## 2021-01-08 ENCOUNTER — Other Ambulatory Visit (HOSPITAL_COMMUNITY): Payer: Self-pay | Admitting: Psychiatry

## 2021-01-08 ENCOUNTER — Telehealth (HOSPITAL_COMMUNITY): Payer: Self-pay | Admitting: Psychiatry

## 2021-01-08 ENCOUNTER — Other Ambulatory Visit: Payer: Self-pay

## 2021-01-08 MED ORDER — CLONAZEPAM 1 MG PO TABS
1.0000 mg | ORAL_TABLET | Freq: Two times a day (BID) | ORAL | 1 refills | Status: DC | PRN
Start: 2021-01-08 — End: 2021-07-30

## 2021-01-10 ENCOUNTER — Other Ambulatory Visit: Payer: Self-pay | Admitting: Family Medicine

## 2021-01-10 DIAGNOSIS — I10 Essential (primary) hypertension: Secondary | ICD-10-CM

## 2021-01-27 ENCOUNTER — Ambulatory Visit (INDEPENDENT_AMBULATORY_CARE_PROVIDER_SITE_OTHER): Payer: Self-pay | Admitting: Nurse Practitioner

## 2021-01-27 ENCOUNTER — Other Ambulatory Visit: Payer: Self-pay

## 2021-01-27 VITALS — BP 152/100 | HR 86 | Temp 98.0°F | Ht 62.0 in | Wt 254.0 lb

## 2021-01-27 DIAGNOSIS — I10 Essential (primary) hypertension: Secondary | ICD-10-CM

## 2021-01-27 DIAGNOSIS — E079 Disorder of thyroid, unspecified: Secondary | ICD-10-CM

## 2021-01-27 DIAGNOSIS — J4520 Mild intermittent asthma, uncomplicated: Secondary | ICD-10-CM

## 2021-01-27 DIAGNOSIS — R7303 Prediabetes: Secondary | ICD-10-CM

## 2021-01-27 DIAGNOSIS — B9689 Other specified bacterial agents as the cause of diseases classified elsewhere: Secondary | ICD-10-CM

## 2021-01-27 DIAGNOSIS — N760 Acute vaginitis: Secondary | ICD-10-CM

## 2021-01-27 LAB — WET PREP FOR TRICH, YEAST, CLUE

## 2021-01-27 MED ORDER — METFORMIN HCL 500 MG PO TABS: 500.0000 mg | ORAL_TABLET | Freq: Every day | ORAL | 0 refills | Status: DC

## 2021-01-27 MED ORDER — FLUCONAZOLE 150 MG PO TABS: 150.0000 mg | ORAL_TABLET | Freq: Once | ORAL | 0 refills | Status: AC

## 2021-01-27 MED ORDER — PROAIR RESPICLICK 108 (90 BASE) MCG/ACT IN AEPB: 2.0000 | INHALATION_SPRAY | Freq: Four times a day (QID) | RESPIRATORY_TRACT | 2 refills | Status: DC

## 2021-01-27 MED ORDER — FAMOTIDINE 20 MG PO TABS: 20.0000 mg | ORAL_TABLET | Freq: Two times a day (BID) | ORAL | 0 refills | Status: DC

## 2021-01-27 MED ORDER — BACLOFEN 10 MG PO TABS
5.0000 mg | ORAL_TABLET | Freq: Every evening | ORAL | 3 refills | Status: DC | PRN
Start: 2021-01-27 — End: 2022-04-13

## 2021-01-27 MED ORDER — AMLODIPINE BESYLATE 5 MG PO TABS: 5.0000 mg | ORAL_TABLET | Freq: Every day | ORAL | 0 refills | Status: DC

## 2021-01-27 MED ORDER — BENAZEPRIL HCL 20 MG PO TABS: 20.0000 mg | ORAL_TABLET | Freq: Every day | ORAL | 0 refills | Status: DC

## 2021-01-27 MED ORDER — IBUPROFEN 800 MG PO TABS: 800.0000 mg | ORAL_TABLET | Freq: Three times a day (TID) | ORAL | 0 refills | Status: DC

## 2021-01-27 NOTE — Progress Notes (Signed)
Subjective:    Patient ID: Janice Roberts, female    DOB: 1976/05/05, 45 y.o.   MRN: [id number]  HPI: Janice Roberts is a 45 y.o. female presenting for vaginal irrigation.  Chief Complaint  Patient presents with  . vaginal irritation   VAGINAL DISCHARGE Reports irritation around the outside and inside of vagina. Duration: days Discharge description: none  Pruritus: no Dysuria: no Malodorous: no Urinary frequency: no Fevers: no Abdominal pain: no  Sexual activity: yes; 1 female partner History of sexually transmitted diseases:  no Recent antibiotic use: yes; Flagyl for BV Context: stable Treatments attempted: none   HYPERTENSION Patient has not taken BP medication yet today.  Does not check BP at home regularly. Hypertension status: controlled  Satisfied with current treatment? yes Duration of hypertension: chronic BP monitoring frequency:  not checking BP medication side effects:  no Medication compliance: excellent; currently taking amlodipine 5 mg and benazepril 20 mg daily. Aspirin: no Recurrent headaches: no Visual changes: no Palpitations: no Dyspnea: no Chest pain: no Lower extremity edema: no Dizzy/lightheaded: no  PRE-DIABETES Hypoglycemic episodes:no Polydipsia/polyuria: no Visual disturbance: no Chest pain: no Paresthesias: no Glucose Monitoring: no Currently taking: Metformin 500 mg with breakfast Blood Pressure Monitoring: not checking Diabetic Education: Completed Pneumovax: Not up to Date Influenza: Not up to Date Aspirin: no  ASTHMA Uses rescue inhaler very sparingly.  Allergies  Allergen Reactions  . Ivp Dye [Iodinated Diagnostic Agents] Anaphylaxis and Other (See Comments)    "Almost died"  . Latex Swelling and Other (See Comments)    irritation  . Morphine And Related Hives  . Oxycodone Hives  . Penicillins Hives and Other (See Comments)    Has patient had a PCN reaction causing immediate rash, facial/tongue/throat swelling,  SOB or lightheadedness with hypotension: No Has patient had a PCN reaction causing severe rash involving mucus membranes or skin necrosis: Yes Has patient had a PCN reaction that required hospitalization No Has patient had a PCN reaction occurring within the last 10 years: No If all of the above answers are "NO", then may proceed with Cephalosporin use.   Marland Kitchen Percocet [Oxycodone-Acetaminophen] Itching    Outpatient Encounter Medications as of 01/27/2021  Medication Sig  . Cholecalciferol (VITAMIN D-3) 125 MCG (5000 UT) TABS Take 1 tablet by mouth daily.  . clonazePAM (KLONOPIN) 1 MG tablet Take 1 tablet (1 mg total) by mouth 2 (two) times daily as needed for anxiety.  . fluconazole (DIFLUCAN) 150 MG tablet Take 1 tablet (150 mg total) by mouth once for 1 dose.  . fluticasone (FLONASE) 50 MCG/ACT nasal spray Place 1 spray into both nostrils 2 (two) times daily as needed for allergies or rhinitis.  Marland Kitchen levothyroxine (SYNTHROID) 112 MCG tablet Take 1 tablet by mouth once daily  . methocarbamol (ROBAXIN) 500 MG tablet Take 1 tablet (500 mg total) by mouth 2 (two) times daily.  . Multiple Vitamins-Calcium (ONE-A-DAY WITHIN PO) Take 1 tablet by mouth daily.  . prazosin (MINIPRESS) 2 MG capsule Take 1 capsule (2 mg total) by mouth 2 (two) times daily.  . sodium chloride (OCEAN) 0.65 % SOLN nasal spray Place 1 spray into both nostrils as needed for congestion.  . Tetrahydrozoline HCl (VISINE OP) Place 1 Drop/kg into both eyes daily as needed (dry eyes).  . venlafaxine XR (EFFEXOR-XR) 75 MG 24 hr capsule Take 3 capsules (225 mg total) by mouth daily with breakfast.  . [DISCONTINUED] acetaminophen (TYLENOL) 500 MG tablet Take 1,000 mg by mouth every 6 (  six) hours as needed for moderate pain or headache.  . [DISCONTINUED] Albuterol Sulfate (PROAIR RESPICLICK) 676 (90 Base) MCG/ACT AEPB Inhale 2 puffs into the lungs every 6 (six) hours. As needed for wheezing or shortness of breath  . [DISCONTINUED]  amLODipine (NORVASC) 5 MG tablet Take 1 tablet (5 mg total) by mouth daily.  . [DISCONTINUED] baclofen (LIORESAL) 10 MG tablet Take 0.5-1 tablets (5-10 mg total) by mouth at bedtime as needed for muscle spasms.  . [DISCONTINUED] benazepril (LOTENSIN) 20 MG tablet Take 1 tablet by mouth once daily  . [DISCONTINUED] celecoxib (CELEBREX) 200 MG capsule Take 1 capsule (200 mg total) by mouth 2 (two) times daily as needed.  . [DISCONTINUED] famotidine (PEPCID) 20 MG tablet Take 1 tablet (20 mg total) by mouth 2 (two) times daily.  . [DISCONTINUED] ibuprofen (ADVIL) 800 MG tablet Take 1 tablet (800 mg total) by mouth 3 (three) times daily.  . [DISCONTINUED] loratadine (CLARITIN) 10 MG tablet Take 1 tablet (10 mg total) by mouth daily.  . [DISCONTINUED] metFORMIN (GLUCOPHAGE) 500 MG tablet Take 1 tablet (500 mg total) by mouth daily with breakfast.  . [DISCONTINUED] moxifloxacin (AVELOX) 400 MG tablet Take 1 tablet (400 mg total) by mouth daily.  . [DISCONTINUED] predniSONE (DELTASONE) 10 MG tablet Take 1 tablet (10 mg total) by mouth daily with breakfast. Take 40mg  on days 1-2. Take 30mg  on days 3-4. Take 20mg  on days 5-6. Take 10mg  on days 7-8. Take 5mg  on days 9-10, then stop.  . Albuterol Sulfate (PROAIR RESPICLICK) 195 (90 Base) MCG/ACT AEPB Inhale 2 puffs into the lungs every 6 (six) hours. As needed for wheezing or shortness of breath  . amLODipine (NORVASC) 5 MG tablet Take 1 tablet (5 mg total) by mouth daily.  . baclofen (LIORESAL) 10 MG tablet Take 0.5-1 tablets (5-10 mg total) by mouth at bedtime as needed for muscle spasms.  . benazepril (LOTENSIN) 20 MG tablet Take 1 tablet (20 mg total) by mouth daily.  . famotidine (PEPCID) 20 MG tablet Take 1 tablet (20 mg total) by mouth 2 (two) times daily.  Marland Kitchen ibuprofen (ADVIL) 800 MG tablet Take 1 tablet (800 mg total) by mouth 3 (three) times daily.  . metFORMIN (GLUCOPHAGE) 500 MG tablet Take 1 tablet (500 mg total) by mouth daily with breakfast.  .  [DISCONTINUED] SUMAtriptan (IMITREX) 100 MG tablet Take 1/2 to 1 full tablet at onset of headache (Patient taking differently: Take 100 mg by mouth every 2 (two) hours as needed for migraine.)   No facility-administered encounter medications on file as of 01/27/2021.    Patient Active Problem List   Diagnosis Date Noted  . Migraines 10/02/2020  . Other insomnia 02/27/2020  . Snoring 02/27/2020  . Sicca syndrome, unspecified 01/30/2020  . Mood disorder ([place]) 01/30/2020  . Panic disorder 12/13/2019  . Seasonal allergies 11/30/2019  . Borderline diabetic 09/05/2019  . PTSD (post-traumatic stress disorder) 07/13/2019  . Endometriosis determined by laparoscopy 02/11/2017  . PID (acute pelvic inflammatory disease) 11/17/2016  . Pelvic pain 11/15/2016  . Family history of breast cancer in mother 10/21/2016  . Pelvic pain in female 11/10/2015  . Anxiety   . Depression   . Asthma   . Class 3 obesity   . Hypertension   . Thyroid disease     Past Medical History:  Diagnosis Date  . Anxiety    no meds  . Asthma    rarely uses inhaler - seasonal with allergies  . Depression  no med  . Hypertension   . Hypothyroidism   . Obesity   . Seasonal allergies   . SVD (spontaneous vaginal delivery) 1997   x   . Thyroid disease     Relevant past medical, surgical, family and social history reviewed and updated as indicated. Interim medical history since our last visit reviewed.  Review of Systems Per HPI unless specifically indicated above     Objective:    BP (!) 152/100   Pulse 86   Temp 98 F (36.7 C)   Ht 5\' 2"  (1.575 m)   Wt 254 lb (115.2 kg)   LMP 09/30/2017   SpO2 100%   BMI 46.46 kg/m   Wt Readings from Last 3 Encounters:  01/27/21 254 lb (115.2 kg)  12/15/20 257 lb (116.6 kg)  10/20/20 286 lb (129.7 kg)    Physical Exam Vitals and nursing note reviewed.  Constitutional:      General: She is not in acute distress.    Appearance: Normal appearance. She is  obese. She is not toxic-appearing.  HENT:     Head: Normocephalic and atraumatic.  Eyes:     General: No scleral icterus.    Extraocular Movements: Extraocular movements intact.  Cardiovascular:     Rate and Rhythm: Normal rate and regular rhythm.  Pulmonary:     Effort: Pulmonary effort is normal. No respiratory distress.     Breath sounds: Normal breath sounds. No wheezing, rhonchi or rales.  Genitourinary:    General: Normal vulva.     Exam position: Lithotomy position.     Pubic Area: No rash or pubic lice.      Labia:        Right: No rash or tenderness.        Left: No rash or tenderness.      Urethra: No prolapse or urethral swelling.     Vagina: Normal. No vaginal discharge, erythema, tenderness, bleeding or lesions.     Adnexa: Right adnexa normal and left adnexa normal.       Right: No mass or fullness.         Left: No mass or fullness.    Lymphadenopathy:     Lower Body: No right inguinal adenopathy. No left inguinal adenopathy.  Skin:    General: Skin is warm and dry.     Capillary Refill: Capillary refill takes less than 2 seconds.     Coloration: Skin is not jaundiced or pale.     Findings: No erythema.  Neurological:     Mental Status: She is alert and oriented to person, place, and time.     Motor: No weakness.     Gait: Gait normal.  Psychiatric:        Mood and Affect: Mood normal.        Behavior: Behavior normal.        Thought Content: Thought content normal.        Judgment: Judgment normal.       Assessment & Plan:   Problem List Items Addressed This Visit      Cardiovascular and Mediastinum   Hypertension    Chronic.  Will check CMP today.  Has not yet taken medication yet today, which is likely the reason for elevation in clinic. Continue amlodipine 5 mg and benazepril 20 mg daily for now. Encouraged patient to check BP at home and report findings if consistently greater than 140/90.  Follow up 6 months.      Relevant Medications  amLODipine (NORVASC) 5 MG tablet   benazepril (LOTENSIN) 20 MG tablet   Other Relevant Orders   COMPLETE METABOLIC PANEL WITH GFR     Respiratory   Asthma    Stable.  Continue with as needed rescue inhaler.  Patient to let us know if she is using more than twice weekly.      Relevant Medications   Albuterol Sulfate (PROAIR RESPICLICK) 208 (90 Base) MCG/ACT AEPB     Endocrine   Thyroid disease    Previously stable.  Will check TSH with panel today.  Continue levothyroxine at current dosing for now.  Follow up 6 months.      Relevant Orders   Thyroid Panel With TSH     Other   Borderline diabetic    Will recheck HgbA1c today, has lost 3 lbs since last visit.  Continue Metformin 500 mg daily with breakfast for now.  Follow up in 3-6 months pending lab results.      Relevant Medications   metFORMIN (GLUCOPHAGE) 500 MG tablet   Other Relevant Orders   Hemoglobin A1c   COMPLETE METABOLIC PANEL WITH GFR    Other Visit Diagnoses    Acute vaginitis    -  Primary   Relevant Medications   fluconazole (DIFLUCAN) 150 MG tablet   Other Relevant Orders   WET PREP FOR [place], YEAST, CLUE (Completed)       Follow up plan: Return in about 6 months (around 07/30/2021) for prediabetes, htn f/u.

## 2021-01-27 NOTE — Assessment & Plan Note (Signed)
Will recheck HgbA1c today, has lost 3 lbs since last visit.  Continue Metformin 500 mg daily with breakfast for now.  Follow up in 3-6 months pending lab results.

## 2021-01-27 NOTE — Assessment & Plan Note (Signed)
Chronic.  Will check CMP today.  Has not yet taken medication yet today, which is likely the reason for elevation in clinic. Continue amlodipine 5 mg and benazepril 20 mg daily for now. Encouraged patient to check BP at home and report findings if consistently greater than 140/90.  Follow up 6 months.

## 2021-01-27 NOTE — Assessment & Plan Note (Signed)
Previously stable.  Will check TSH with panel today.  Continue levothyroxine at current dosing for now.  Follow up 6 months.

## 2021-01-27 NOTE — Patient Instructions (Signed)
F/u 6 months

## 2021-01-27 NOTE — Assessment & Plan Note (Signed)
Stable.  Continue with as needed rescue inhaler.  Patient to let us know if she is using more than twice weekly.

## 2021-01-28 LAB — COMPLETE METABOLIC PANEL WITH GFR
AG Ratio: 1.3 (calc) (ref 1.0–2.5)
ALT: 12 U/L (ref 6–29)
AST: 10 U/L (ref 10–35)
Albumin: 3.8 g/dL (ref 3.6–5.1)
Alkaline phosphatase (APISO): 64 U/L (ref 31–125)
BUN: 13 mg/dL (ref 7–25)
CO2: 29 mmol/L (ref 20–32)
Calcium: 9 mg/dL (ref 8.6–10.2)
Chloride: 109 mmol/L (ref 98–110)
Creat: 0.97 mg/dL (ref 0.50–1.10)
GFR, Est African American: 81 mL/min/{1.73_m2} (ref >=60)
GFR, Est Non African American: 70 mL/min/{1.73_m2} (ref >=60)
Globulin: 3 g/dL (calc) (ref 1.9–3.7)
Glucose, Bld: 126 mg/dL — ABNORMAL HIGH (ref 65–99)
Potassium: 4.2 mmol/L (ref 3.5–5.3)
Sodium: 144 mmol/L (ref 135–146)
Total Bilirubin: 0.5 mg/dL (ref 0.2–1.2)
Total Protein: 6.8 g/dL (ref 6.1–8.1)

## 2021-01-28 LAB — HEMOGLOBIN A1C
Hgb A1c MFr Bld: 6.2 % of total Hgb — ABNORMAL HIGH (ref <5.7)
Mean Plasma Glucose: 131 mg/dL
eAG (mmol/L): 7.3 mmol/L

## 2021-01-28 LAB — THYROID PANEL WITH TSH
Free Thyroxine Index: 2.3 (ref 1.4–3.8)
T3 Uptake: 29 % (ref 22–35)
T4, Total: 7.9 ug/dL (ref 5.1–11.9)
TSH: 1.36 mIU/L

## 2021-01-30 ENCOUNTER — Telehealth: Payer: Self-pay

## 2021-01-30 NOTE — Telephone Encounter (Signed)
Spoke with pt regarding rx for bp. Pt will come to pick up at front office

## 2021-04-08 ENCOUNTER — Other Ambulatory Visit: Payer: Self-pay | Admitting: Family Medicine

## 2021-05-31 ENCOUNTER — Ambulatory Visit
Admission: EM | Admit: 2021-05-31 | Discharge: 2021-05-31 | Disposition: A | Discharge status: Home / Self Care | Payer: BLUE CROSS/BLUE SHIELD | Attending: Urgent Care | Admitting: Urgent Care

## 2021-05-31 ENCOUNTER — Other Ambulatory Visit: Payer: Self-pay

## 2021-05-31 ENCOUNTER — Encounter: Payer: Self-pay | Admitting: Emergency Medicine

## 2021-05-31 DIAGNOSIS — J4520 Mild intermittent asthma, uncomplicated: Secondary | ICD-10-CM

## 2021-05-31 DIAGNOSIS — B9789 Other viral agents as the cause of diseases classified elsewhere: Secondary | ICD-10-CM | POA: Diagnosis not present

## 2021-05-31 DIAGNOSIS — Z20822 Contact with and (suspected) exposure to covid-19: Secondary | ICD-10-CM

## 2021-05-31 DIAGNOSIS — J988 Other specified respiratory disorders: Secondary | ICD-10-CM

## 2021-05-31 DIAGNOSIS — J3089 Other allergic rhinitis: Secondary | ICD-10-CM

## 2021-05-31 MED ORDER — ALBUTEROL SULFATE HFA 108 (90 BASE) MCG/ACT IN AERS: 1.0000 | INHALATION_SPRAY | Freq: Four times a day (QID) | RESPIRATORY_TRACT | 0 refills | Status: AC | PRN

## 2021-05-31 MED ORDER — BENZONATATE 100 MG PO CAPS: 100.0000 mg | ORAL_CAPSULE | Freq: Three times a day (TID) | ORAL | 0 refills | Status: DC | PRN

## 2021-05-31 MED ORDER — CETIRIZINE HCL 10 MG PO TABS: 10.0000 mg | ORAL_TABLET | Freq: Every day | ORAL | 0 refills | Status: DC

## 2021-05-31 MED ORDER — PREDNISONE 20 MG PO TABS: ORAL_TABLET | ORAL | 0 refills | Status: DC

## 2021-05-31 NOTE — Discharge Instructions (Addendum)

## 2021-05-31 NOTE — ED Provider Notes (Signed)
[place]   MRN: [id number] DOB: 1976/01/13  Subjective:   Janice Roberts is a 45 y.o. female presenting for 5-day history of acute onset sinus congestion, facial pressure and pain, postnasal drainage, cough.  Denies fever, chest pain, shortness of breath, wheezing.  Patient does have a history of allergic rhinitis and is not taking medication for this.  She is also an asthmatic patient, would like a refill.  Denies smoking cigarettes.  Patient is not COVID vaccinated.  She would like testing for this.  No current facility-administered medications for this encounter.  Current Outpatient Medications:    Albuterol Sulfate (PROAIR RESPICLICK) 123XX123 (90 Base) MCG/ACT AEPB, Inhale 2 puffs into the lungs every 6 (six) hours. As needed for wheezing or shortness of breath, Disp: 1 each, Rfl: 2   amLODipine (NORVASC) 5 MG tablet, Take 1 tablet (5 mg total) by mouth daily., Disp: 90 tablet, Rfl: 0   baclofen (LIORESAL) 10 MG tablet, Take 0.5-1 tablets (5-10 mg total) by mouth at bedtime as needed for muscle spasms., Disp: 30 each, Rfl: 3   benazepril (LOTENSIN) 20 MG tablet, Take 1 tablet (20 mg total) by mouth daily., Disp: 90 tablet, Rfl: 0   Cholecalciferol (VITAMIN D-3) 125 MCG (5000 UT) TABS, Take 1 tablet by mouth daily., Disp: 90 tablet, Rfl: 3   clonazePAM (KLONOPIN) 1 MG tablet, Take 1 tablet (1 mg total) by mouth 2 (two) times daily as needed for anxiety., Disp: 60 tablet, Rfl: 1   famotidine (PEPCID) 20 MG tablet, Take 1 tablet (20 mg total) by mouth 2 (two) times daily., Disp: 10 tablet, Rfl: 0   fluticasone (FLONASE) 50 MCG/ACT nasal spray, Place 1 spray into both nostrils 2 (two) times daily as needed for allergies or rhinitis., Disp: 16 g, Rfl: 2   ibuprofen (ADVIL) 800 MG tablet, Take 1 tablet (800 mg total) by mouth 3 (three) times daily., Disp: 21 tablet, Rfl: 0   levothyroxine (SYNTHROID) 112 MCG tablet, Take 1 tablet by mouth once daily, Disp: 90 tablet, Rfl: 0    metFORMIN (GLUCOPHAGE) 500 MG tablet, Take 1 tablet (500 mg total) by mouth daily with breakfast., Disp: 90 tablet, Rfl: 0   methocarbamol (ROBAXIN) 500 MG tablet, Take 1 tablet (500 mg total) by mouth 2 (two) times daily., Disp: 20 tablet, Rfl: 0   Multiple Vitamins-Calcium (ONE-A-DAY WITHIN PO), Take 1 tablet by mouth daily., Disp: , Rfl:    prazosin (MINIPRESS) 2 MG capsule, Take 1 capsule (2 mg total) by mouth 2 (two) times daily., Disp: 180 capsule, Rfl: 1   sodium chloride (OCEAN) 0.65 % SOLN nasal spray, Place 1 spray into both nostrils as needed for congestion., Disp: 88 mL, Rfl: 0   Tetrahydrozoline HCl (VISINE OP), Place 1 Drop/kg into both eyes daily as needed (dry eyes)., Disp: , Rfl:    venlafaxine XR (EFFEXOR-XR) 75 MG 24 hr capsule, Take 3 capsules (225 mg total) by mouth daily with breakfast., Disp: 90 capsule, Rfl: 5   Allergies  Allergen Reactions   Ivp Dye [Iodinated Diagnostic Agents] Anaphylaxis and Other (See Comments)    "Almost died"   Latex Swelling and Other (See Comments)    irritation   Morphine And Related Hives   Oxycodone Hives   Penicillins Hives and Other (See Comments)    Has patient had a PCN reaction causing immediate rash, facial/tongue/throat swelling, SOB or lightheadedness with hypotension: No Has patient had a PCN reaction causing severe rash involving mucus membranes or skin necrosis:  Yes Has patient had a PCN reaction that required hospitalization No Has patient had a PCN reaction occurring within the last 10 years: No If all of the above answers are "NO", then may proceed with Cephalosporin use.    Percocet [Oxycodone-Acetaminophen] Itching    Past Medical History:  Diagnosis Date   Anxiety    no meds   Asthma    rarely uses inhaler - seasonal with allergies   Depression    no med   Hypertension    Hypothyroidism    Obesity    Seasonal allergies    SVD (spontaneous vaginal delivery) 1997   x    Thyroid disease      Past Surgical  History:  Procedure Laterality Date   ABDOMINAL HYSTERECTOMY     APPENDECTOMY     CESAREAN SECTION  1993   x    DILATION AND CURETTAGE OF UTERUS  08/08/2008 & 03/08/2013   2009 missed ab, 2014 bleeding   LAPAROSCOPIC VAGINAL HYSTERECTOMY WITH SALPINGO OOPHORECTOMY Bilateral 12/06/2017   Procedure: LAPAROSCOPIC ASSISTED VAGINAL HYSTERECTOMY WITH SALPINGO OOPHORECTOMY;  Surgeon: Woodroe Mode, MD;  Location: [facility];  Service: Gynecology;  Laterality: Bilateral;   LAPAROSCOPY N/A 01/24/2017   Procedure: LAPAROSCOPY DIAGNOSTIC;  Surgeon: Woodroe Mode, MD;  Location: [facility];  Service: Gynecology;  Laterality: N/A;   SHOULDER SURGERY Left    THYROIDECTOMY     WISDOM TOOTH EXTRACTION      Family History  Problem Relation Age of Onset   Cancer Mother 36       Breast   Depression Mother    Hearing loss Mother    Hypertension Mother    Stroke Mother    Breast cancer Mother    Asthma Brother    Birth defects Sister     Social History   Tobacco Use   Smoking status: Never   Smokeless tobacco: Never  Vaping Use   Vaping Use: Never used  Substance Use Topics   Alcohol use: No   Drug use: No    ROS   Objective:   Vitals: BP 130/88 (BP Location: Left Arm)   Pulse 91   Temp 99.2 F (37.3 C) (Oral)   Resp 18   LMP 09/30/2017   SpO2 96%   Physical Exam Constitutional:      General: She is not in acute distress.    Appearance: Normal appearance. She is well-developed. She is not ill-appearing, toxic-appearing or diaphoretic.  HENT:     Head: Normocephalic and atraumatic.     Right Ear: Tympanic membrane, ear canal and external ear normal. No drainage or tenderness. No middle ear effusion. Tympanic membrane is not erythematous.     Left Ear: Tympanic membrane, ear canal and external ear normal. No drainage or tenderness.  No middle ear effusion. Tympanic membrane is not erythematous.     Nose: Congestion and rhinorrhea present.     Mouth/Throat:     Mouth: Mucous membranes  are moist. No oral lesions.     Pharynx: No pharyngeal swelling, oropharyngeal exudate, posterior oropharyngeal erythema or uvula swelling.     Tonsils: No tonsillar exudate or tonsillar abscesses.  Eyes:     Extraocular Movements: Extraocular movements intact.     Right eye: Normal extraocular motion.     Left eye: Normal extraocular motion.     Conjunctiva/sclera: Conjunctivae normal.     Pupils: Pupils are equal, round, and reactive to light.  Cardiovascular:     Rate and Rhythm: Normal  rate and regular rhythm.     Pulses: Normal pulses.     Heart sounds: Normal heart sounds. No murmur heard.   No friction rub. No gallop.  Pulmonary:     Effort: Pulmonary effort is normal. No respiratory distress.     Breath sounds: Normal breath sounds. No stridor. No wheezing, rhonchi or rales.  Musculoskeletal:     Cervical back: Normal range of motion and neck supple.  Lymphadenopathy:     Cervical: No cervical adenopathy.  Skin:    General: Skin is warm and dry.     Findings: No rash.  Neurological:     General: No focal deficit present.     Mental Status: She is alert and oriented to person, place, and time.  Psychiatric:        Mood and Affect: Mood normal.        Behavior: Behavior normal.        Thought Content: Thought content normal.    Assessment and Plan :   PDMP not reviewed this encounter.  1. Viral respiratory illness   2. Encounter for screening laboratory testing for COVID-19 virus   3. Allergic rhinitis due to other allergic trigger, unspecified seasonality   4. Mild intermittent asthma without complication     In light of her asthma and allergic rhinitis we will use an oral prednisone course.  Patient was not inclined to use allergy medication but ultimately agreed to using Zyrtec.  Recommended supportive care otherwise.  Refilled albuterol inhaler. Will manage for viral illness such as viral URI, viral syndrome, viral rhinitis, COVID-19. Counseled patient on nature  of COVID-19 including modes of transmission, diagnostic testing, management and supportive care.  Offered scripts for symptomatic relief. COVID 19 testing is pending. Counseled patient on potential for adverse effects with medications prescribed/recommended today, ER and return-to-clinic precautions discussed, patient verbalized understanding.     Jaynee Eagles, [place] 05/31/21 [phone]

## 2021-05-31 NOTE — ED Triage Notes (Signed)
Pt here for facial pain and congestion x 5 days

## 2021-06-01 LAB — SARS-COV-2, NAA 2 DAY TAT

## 2021-06-01 LAB — NOVEL CORONAVIRUS, NAA: SARS-CoV-2, NAA: DETECTED — AB

## 2021-06-24 ENCOUNTER — Ambulatory Visit: Payer: Medicaid Other | Admitting: Nurse Practitioner

## 2021-06-24 NOTE — Progress Notes (Deleted)
Subjective:    Patient ID: Janice Roberts, female    DOB: [birth year], 45 y.o.   MRN: [id number]  HPI: Janice Roberts is a 45 y.o. female presenting for  No chief complaint on file.   Allergies  Allergen Reactions   Ivp Dye [Iodinated Diagnostic Agents] Anaphylaxis and Other (See Comments)    "Almost died"   Latex Swelling and Other (See Comments)    irritation   Morphine And Related Hives   Oxycodone Hives   Penicillins Hives and Other (See Comments)    Has patient had a PCN reaction causing immediate rash, facial/tongue/throat swelling, SOB or lightheadedness with hypotension: No Has patient had a PCN reaction causing severe rash involving mucus membranes or skin necrosis: Yes Has patient had a PCN reaction that required hospitalization No Has patient had a PCN reaction occurring within the last 10 years: No If all of the above answers are "NO", then may proceed with Cephalosporin use.    Percocet [Oxycodone-Acetaminophen] Itching    Outpatient Encounter Medications as of 06/24/2021  Medication Sig   albuterol (VENTOLIN HFA) 108 (90 Base) MCG/ACT inhaler Inhale 1-2 puffs into the lungs every 6 (six) hours as needed for wheezing or shortness of breath.   amLODipine (NORVASC) 5 MG tablet Take 1 tablet (5 mg total) by mouth daily.   baclofen (LIORESAL) 10 MG tablet Take 0.5-1 tablets (5-10 mg total) by mouth at bedtime as needed for muscle spasms.   benazepril (LOTENSIN) 20 MG tablet Take 1 tablet (20 mg total) by mouth daily.   benzonatate (TESSALON) 100 MG capsule Take 1-2 capsules (100-200 mg total) by mouth 3 (three) times daily as needed.   cetirizine (ZYRTEC ALLERGY) 10 MG tablet Take 1 tablet (10 mg total) by mouth daily.   Cholecalciferol (VITAMIN D-3) 125 MCG (5000 UT) TABS Take 1 tablet by mouth daily.   clonazePAM (KLONOPIN) 1 MG tablet Take 1 tablet (1 mg total) by mouth 2 (two) times daily as needed for anxiety.   famotidine (PEPCID) 20 MG tablet Take 1 tablet  (20 mg total) by mouth 2 (two) times daily.   fluticasone (FLONASE) 50 MCG/ACT nasal spray Place 1 spray into both nostrils 2 (two) times daily as needed for allergies or rhinitis.   ibuprofen (ADVIL) 800 MG tablet Take 1 tablet (800 mg total) by mouth 3 (three) times daily.   levothyroxine (SYNTHROID) 112 MCG tablet Take 1 tablet by mouth once daily   metFORMIN (GLUCOPHAGE) 500 MG tablet Take 1 tablet (500 mg total) by mouth daily with breakfast.   methocarbamol (ROBAXIN) 500 MG tablet Take 1 tablet (500 mg total) by mouth 2 (two) times daily.   Multiple Vitamins-Calcium (ONE-A-DAY WITHIN PO) Take 1 tablet by mouth daily.   prazosin (MINIPRESS) 2 MG capsule Take 1 capsule (2 mg total) by mouth 2 (two) times daily.   predniSONE (DELTASONE) 20 MG tablet Take 2 tablets daily with breakfast.   sodium chloride (OCEAN) 0.65 % SOLN nasal spray Place 1 spray into both nostrils as needed for congestion.   Tetrahydrozoline HCl (VISINE OP) Place 1 Drop/kg into both eyes daily as needed (dry eyes).   venlafaxine XR (EFFEXOR-XR) 75 MG 24 hr capsule Take 3 capsules (225 mg total) by mouth daily with breakfast.   [DISCONTINUED] SUMAtriptan (IMITREX) 100 MG tablet Take 1/2 to 1 full tablet at onset of headache (Patient taking differently: Take 100 mg by mouth every 2 (two) hours as needed for migraine.)   No facility-administered encounter  medications on file as of 06/24/2021.    Patient Active Problem List   Diagnosis Date Noted   Migraines 10/02/2020   Other insomnia 02/27/2020   Snoring 02/27/2020   Sicca syndrome, unspecified 01/30/2020   Mood disorder ([place]) 01/30/2020   Panic disorder 12/13/2019   Seasonal allergies 11/30/2019   Borderline diabetic 09/05/2019   PTSD (post-traumatic stress disorder) 07/13/2019   Endometriosis determined by laparoscopy 02/11/2017   PID (acute pelvic inflammatory disease) 11/17/2016   Pelvic pain 11/15/2016   Family history of breast cancer in mother 10/21/2016    Pelvic pain in female 11/10/2015   Anxiety    Depression    Asthma    Class 3 obesity    Hypertension    Thyroid disease     Past Medical History:  Diagnosis Date   Anxiety    no meds   Asthma    rarely uses inhaler - seasonal with allergies   Depression    no med   Hypertension    Hypothyroidism    Obesity    Seasonal allergies    SVD (spontaneous vaginal delivery) 1997   x    Thyroid disease     Relevant past medical, surgical, family and social history reviewed and updated as indicated. Interim medical history since our last visit reviewed.  Review of Systems Per HPI unless specifically indicated above     Objective:    LMP 09/30/2017   Wt Readings from Last 3 Encounters:  01/27/21 254 lb (115.2 kg)  12/15/20 257 lb (116.6 kg)  10/20/20 286 lb (129.7 kg)    Physical Exam  Results for orders placed or performed during the hospital encounter of 05/31/21  Novel Coronavirus, NAA ([facility])   Specimen: Nasopharyngeal Swab; Nasopharyngeal(NP) swabs in vial transport medium   Nasopharynge  Patient  Result Value Ref Range   SARS-CoV-2, NAA Detected (A) Not Detected  SARS-COV-2, NAA 2 DAY TAT   Nasopharynge  Patient  Result Value Ref Range   SARS-CoV-2, NAA 2 DAY TAT Performed       Assessment & Plan:   Problem List Items Addressed This Visit   None    Follow up plan: No follow-ups on file.

## 2021-07-30 ENCOUNTER — Ambulatory Visit (INDEPENDENT_AMBULATORY_CARE_PROVIDER_SITE_OTHER): Payer: Self-pay | Admitting: Nurse Practitioner

## 2021-07-30 ENCOUNTER — Other Ambulatory Visit: Payer: Self-pay

## 2021-07-30 ENCOUNTER — Encounter: Payer: Self-pay | Admitting: Nurse Practitioner

## 2021-07-30 VITALS — BP 140/98 | HR 65 | Temp 98.6°F | Wt 255.0 lb

## 2021-07-30 DIAGNOSIS — F4310 Post-traumatic stress disorder, unspecified: Secondary | ICD-10-CM

## 2021-07-30 DIAGNOSIS — I10 Essential (primary) hypertension: Secondary | ICD-10-CM

## 2021-07-30 DIAGNOSIS — J4520 Mild intermittent asthma, uncomplicated: Secondary | ICD-10-CM

## 2021-07-30 DIAGNOSIS — F39 Unspecified mood [affective] disorder: Secondary | ICD-10-CM

## 2021-07-30 DIAGNOSIS — E079 Disorder of thyroid, unspecified: Secondary | ICD-10-CM

## 2021-07-30 DIAGNOSIS — G8929 Other chronic pain: Secondary | ICD-10-CM

## 2021-07-30 DIAGNOSIS — M79645 Pain in left finger(s): Secondary | ICD-10-CM

## 2021-07-30 DIAGNOSIS — F410 Panic disorder [episodic paroxysmal anxiety] without agoraphobia: Secondary | ICD-10-CM

## 2021-07-30 DIAGNOSIS — M545 Low back pain, unspecified: Secondary | ICD-10-CM

## 2021-07-30 DIAGNOSIS — R7303 Prediabetes: Secondary | ICD-10-CM

## 2021-07-30 DIAGNOSIS — F419 Anxiety disorder, unspecified: Secondary | ICD-10-CM

## 2021-07-30 MED ORDER — BENAZEPRIL HCL 20 MG PO TABS: 20.0000 mg | ORAL_TABLET | Freq: Every day | ORAL | 1 refills | Status: DC

## 2021-07-30 MED ORDER — IBUPROFEN 800 MG PO TABS: 800.0000 mg | ORAL_TABLET | Freq: Three times a day (TID) | ORAL | 0 refills | Status: DC

## 2021-07-30 MED ORDER — METFORMIN HCL 500 MG PO TABS: 500.0000 mg | ORAL_TABLET | Freq: Every day | ORAL | 1 refills | Status: DC

## 2021-07-30 MED ORDER — LEVOTHYROXINE SODIUM 112 MCG PO TABS: 112.0000 ug | ORAL_TABLET | Freq: Every day | ORAL | 1 refills | Status: DC

## 2021-07-30 MED ORDER — AMLODIPINE BESYLATE 5 MG PO TABS: 5.0000 mg | ORAL_TABLET | Freq: Every day | ORAL | 1 refills | Status: DC

## 2021-07-30 NOTE — Progress Notes (Signed)
Subjective:    Patient ID: Janice Roberts, female    DOB: 22-Sep-1976, 45 y.o.   MRN: [id number]  HPI: Janice Roberts is a 45 y.o. female presenting for follow up.  Chief Complaint  Patient presents with   Hypertension   borderline diabetic   Depression   HYPERTENSION Taking amlodipine 5 mg daily, benazepril 20 mg daily.  Does not check BP at home.   Hypertension status: uncontrolled  BP monitoring frequency:  not checking Aspirin: no Recurrent headaches: no Visual changes: no Palpitations: no Dyspnea: no Chest pain: no Lower extremity edema: no Dizzy/lightheaded: no  ASTHMA Has been using rescue inhaler daily for the past month. Says at times, she felt like she couldn't breathe.  Thinks she may have been having panic attacks.  Denies wheezing, cough.  Does have sudden onset shortness of breath.  Recently stopped taking her medications prescribed by Psychiatry.  ALLERGIES Reports she is taking Benadryl every night for allergies.  The other medications did not help as much. Duration: chronic  HYPOTHYROIDISM Has been out of levothyroxine for a few days.  Was previously taking levothyroxine 112 mcg daily in the morning on an empty stomach with good control of her symptoms. Thyroid control status:stable Satisfied with current treatment? yes Medication side effects: no Medication compliance: excellent compliance Recent dose adjustment:no Fatigue: yes Cold intolerance: no Heat intolerance: no Weight gain: no Weight loss: no Constipation: no Diarrhea/loose stools: no Palpitations: no Lower extremity edema: no Anxiety/depressed mood: yes  PRE-DIABETES Taking Metformin 500 mg daily with breakfast.  No issues with this medication.  Hypoglycemic episodes:no Polydipsia/polyuria: no Visual disturbance: no Chest pain: no Paresthesias: no Glucose Monitoring: no  ANXIETY/STRESS Stopped seeing Psychiatrist.  Talks to Therapist every 2 weeks.  Reports in 2016-01-28, her mom  passed away who was her best friend.  Tells me that some of the medications she was on in the past made her groggy and spaced out and she does not want to feel that way.  She is wiling to see a new Psychiatrist.   Depression screen [facility] 2/9 07/30/2021 12/15/2020 01/09/2020 07/13/2019 01/09/2019  Decreased Interest 3 0 3 3 3   Down, Depressed, Hopeless 3 0 3 3 3   PHQ - 2 Score 6 0 6 6 6   Altered sleeping 3 - 3 1 3   Tired, decreased energy 3 - 3 2 3   Change in appetite 2 - 0 2 0  Feeling bad or failure about yourself  3 - 1 2 3   Trouble concentrating 2 - 1 1 3   Moving slowly or fidgety/restless 2 - 0 1 3  Suicidal thoughts 3 - 1 2 -  PHQ-9 Score 24 - 15 17 21   Difficult doing work/chores Somewhat difficult - Very difficult Very difficult Somewhat difficult  Some recent data might be hidden    GAD 7 : Generalized Anxiety Score 07/30/2021 07/13/2019 01/09/2019 10/13/2017  Nervous, Anxious, on Edge 3 1 3 3   Control/stop worrying 3 3 3 3   Worry too much - different things 3 3 3 3   Trouble relaxing 3 2 3 3   Restless 2 1 2 1   Easily annoyed or irritable 3 3 2 3   Afraid - awful might happen 3 3 3 3   Total GAD 7 Score 20 16 19 19   Anxiety Difficulty Somewhat difficult Very difficult Extremely difficult -   Tells me she has thoughts of hurting herself or of dying often but does not have a specific plan or intent to do  so today.  BACK PAIN Ibuprofen 800 mg - takes a couples of times per month when migraine hits or when back starts hurting.  Also taking baclofen occasionally for back pain.  Saw Orthopedic 10/2020 and recommend PT/follow up with imaging if symptoms persistent.  Tells me her left leg 'goes numb" sometimes.  Wondering if it is related to back pain. Tells me she has been told she has a bulging disc in her back.  Duration: chronic Location: midline, radiates to each side of low back Onset: gradual Severity: moderate to severe Quality: sharp at times Frequency: intermittent Radiation:  no Aggravating factors: certain movements, activities Alleviating factors: NSAID, muscle relaxant Status: stable Treatments attempted:   NSAID, muscle relaxant Relief with NSAIDs?: significant Nighttime pain:  no Paresthesias / decreased sensation:  no Bowel / bladder incontinence:  no Fevers:  no Dysuria / urinary frequency:  no  Lastly, reports she recently fell against a wall and caught herself on her hand but stretched her pinky on her left hand out.  It has been a little bit sore ever since.   Allergies  Allergen Reactions   Ivp Dye [Iodinated Diagnostic Agents] Anaphylaxis and Other (See Comments)    "Almost died"   Latex Swelling and Other (See Comments)    irritation   Morphine And Related Hives   Oxycodone Hives   Penicillins Hives and Other (See Comments)    Has patient had a PCN reaction causing immediate rash, facial/tongue/throat swelling, SOB or lightheadedness with hypotension: No Has patient had a PCN reaction causing severe rash involving mucus membranes or skin necrosis: Yes Has patient had a PCN reaction that required hospitalization No Has patient had a PCN reaction occurring within the last 10 years: No If all of the above answers are "NO", then may proceed with Cephalosporin use.    Percocet [Oxycodone-Acetaminophen] Itching    Outpatient Encounter Medications as of 07/30/2021  Medication Sig   albuterol (VENTOLIN HFA) 108 (90 Base) MCG/ACT inhaler Inhale 1-2 puffs into the lungs every 6 (six) hours as needed for wheezing or shortness of breath.   cetirizine (ZYRTEC ALLERGY) 10 MG tablet Take 1 tablet (10 mg total) by mouth daily.   fluticasone (FLONASE) 50 MCG/ACT nasal spray Place 1 spray into both nostrils 2 (two) times daily as needed for allergies or rhinitis.   Multiple Vitamins-Calcium (ONE-A-DAY WITHIN PO) Take 1 tablet by mouth daily.   Tetrahydrozoline HCl (VISINE OP) Place 1 Drop/kg into both eyes daily as needed (dry eyes).   [DISCONTINUED]  amLODipine (NORVASC) 5 MG tablet Take 1 tablet (5 mg total) by mouth daily.   [DISCONTINUED] benazepril (LOTENSIN) 20 MG tablet Take 1 tablet (20 mg total) by mouth daily.   [DISCONTINUED] ibuprofen (ADVIL) 800 MG tablet Take 1 tablet (800 mg total) by mouth 3 (three) times daily.   [DISCONTINUED] levothyroxine (SYNTHROID) 112 MCG tablet Take 1 tablet by mouth once daily   [DISCONTINUED] metFORMIN (GLUCOPHAGE) 500 MG tablet Take 1 tablet (500 mg total) by mouth daily with breakfast.   amLODipine (NORVASC) 5 MG tablet Take 1 tablet (5 mg total) by mouth daily.   baclofen (LIORESAL) 10 MG tablet Take 0.5-1 tablets (5-10 mg total) by mouth at bedtime as needed for muscle spasms. (Patient not taking: Reported on 07/30/2021)   benazepril (LOTENSIN) 20 MG tablet Take 1 tablet (20 mg total) by mouth daily.   famotidine (PEPCID) 20 MG tablet Take 1 tablet (20 mg total) by mouth 2 (two) times daily. (Patient not taking:  Reported on 07/30/2021)   ibuprofen (ADVIL) 800 MG tablet Take 1 tablet (800 mg total) by mouth 3 (three) times daily.   levothyroxine (SYNTHROID) 112 MCG tablet Take 1 tablet (112 mcg total) by mouth daily.   metFORMIN (GLUCOPHAGE) 500 MG tablet Take 1 tablet (500 mg total) by mouth daily with breakfast.   methocarbamol (ROBAXIN) 500 MG tablet Take 1 tablet (500 mg total) by mouth 2 (two) times daily. (Patient not taking: Reported on 07/30/2021)   [DISCONTINUED] benzonatate (TESSALON) 100 MG capsule Take 1-2 capsules (100-200 mg total) by mouth 3 (three) times daily as needed.   [DISCONTINUED] Cholecalciferol (VITAMIN D-3) 125 MCG (5000 UT) TABS Take 1 tablet by mouth daily. (Patient not taking: Reported on 07/30/2021)   [DISCONTINUED] clonazePAM (KLONOPIN) 1 MG tablet Take 1 tablet (1 mg total) by mouth 2 (two) times daily as needed for anxiety.   [DISCONTINUED] prazosin (MINIPRESS) 2 MG capsule Take 1 capsule (2 mg total) by mouth 2 (two) times daily.   [DISCONTINUED] predniSONE (DELTASONE) 20  MG tablet Take 2 tablets daily with breakfast. (Patient not taking: Reported on 07/30/2021)   [DISCONTINUED] sodium chloride (OCEAN) 0.65 % SOLN nasal spray Place 1 spray into both nostrils as needed for congestion. (Patient not taking: Reported on 07/30/2021)   [DISCONTINUED] SUMAtriptan (IMITREX) 100 MG tablet Take 1/2 to 1 full tablet at onset of headache (Patient taking differently: Take 100 mg by mouth every 2 (two) hours as needed for migraine.)   [DISCONTINUED] venlafaxine XR (EFFEXOR-XR) 75 MG 24 hr capsule Take 3 capsules (225 mg total) by mouth daily with breakfast. (Patient not taking: Reported on 07/30/2021)   No facility-administered encounter medications on file as of 07/30/2021.    Patient Active Problem List   Diagnosis Date Noted   Chronic bilateral low back pain without sciatica 08/02/2021   Migraines 10/02/2020   Other insomnia 02/27/2020   Snoring 02/27/2020   Sicca syndrome, unspecified 01/30/2020   Mood disorder ([place]) 01/30/2020   Panic disorder 12/13/2019   Seasonal allergies 11/30/2019   Borderline diabetic 09/05/2019   PTSD (post-traumatic stress disorder) 07/13/2019   Endometriosis determined by laparoscopy 02/11/2017   PID (acute pelvic inflammatory disease) 11/17/2016   Pelvic pain 11/15/2016   Family history of breast cancer in mother 10/21/2016   Pelvic pain in female 11/10/2015   Anxiety    Depression    Asthma    Class 3 obesity    Hypertension    Thyroid disease     Past Medical History:  Diagnosis Date   Anxiety    no meds   Asthma    rarely uses inhaler - seasonal with allergies   Depression    no med   Hypertension    Hypothyroidism    Obesity    Seasonal allergies    SVD (spontaneous vaginal delivery) 1997   x    Thyroid disease     Relevant past medical, surgical, family and social history reviewed and updated as indicated. Interim medical history since our last visit reviewed.  Review of Systems Per HPI unless specifically  indicated above     Objective:    BP (!) 140/98   Pulse 65   Temp 98.6 F (37 C)   Wt 255 lb (115.7 kg)   LMP 09/30/2017   SpO2 97%   BMI 46.64 kg/m   Wt Readings from Last 3 Encounters:  07/30/21 255 lb (115.7 kg)  01/27/21 254 lb (115.2 kg)  12/15/20 257 lb (116.6 kg)  Physical Exam Vitals and nursing note reviewed.  Constitutional:      General: She is not in acute distress.    Appearance: Normal appearance. She is obese. She is not toxic-appearing.  Eyes:     General: No scleral icterus.    Extraocular Movements: Extraocular movements intact.  Neck:     Vascular: No carotid bruit.  Cardiovascular:     Rate and Rhythm: Normal rate and regular rhythm.     Heart sounds: Normal heart sounds. No murmur heard. Pulmonary:     Effort: Pulmonary effort is normal. No respiratory distress.     Breath sounds: Normal breath sounds. No wheezing, rhonchi or rales.  Abdominal:     General: Abdomen is flat. Bowel sounds are normal.     Palpations: Abdomen is soft.     Tenderness: There is no abdominal tenderness.  Musculoskeletal:        General: Tenderness present. No swelling. Normal range of motion.     Right hand: Normal.     Left hand: Normal.     Cervical back: Normal range of motion.     Lumbar back: No swelling or edema. Normal range of motion. Negative right straight leg raise test and negative left straight leg raise test.       Back:     Right lower leg: No edema.     Left lower leg: No edema.     Comments: Tenderness to palpation in the area marked  Skin:    General: Skin is warm and dry.     Capillary Refill: Capillary refill takes less than 2 seconds.     Coloration: Skin is not jaundiced or pale.     Findings: No bruising or erythema.  Neurological:     Mental Status: She is alert and oriented to person, place, and time.     Gait: Gait normal.  Psychiatric:        Mood and Affect: Mood normal.        Behavior: Behavior normal.        Thought Content:  Thought content normal.        Judgment: Judgment normal.      Assessment & Plan:   Problem List Items Addressed This Visit       Cardiovascular and Mediastinum   Hypertension    Chronic.  BP elevated above goal today in clinic.  I suspect this may in part be secondary to her uncontrolled mood.  I have asked the patient to check her blood pressure at the pharmacy a few times per week.  I'd like her to write down the findings and report them to me in 2 weeks.  Goal is less than 130/80.  Follow up in 3 months or sooner if blood pressure above goal at home.  CBC, electrolytes with kidney function, and TSH checked today.       Relevant Medications   amLODipine (NORVASC) 5 MG tablet   benazepril (LOTENSIN) 20 MG tablet   Other Relevant Orders   COMPLETE METABOLIC PANEL WITH GFR (Completed)   Lipid panel (Completed)   CBC with Differential/Platelet (Completed)     Respiratory   Asthma    Chronic.  Discussed use of SABA at length today.  It sounds like she is having frequent panic attacks, which trigger hyperventilation and prompt her to use her rescue inhaler.  She understands that the rescue inhaler is not meant to be used daily and that we need to get her mood under better  control.          Endocrine   Thyroid disease    Will check TSH today. Follow up 6 months as long as it is stable.      Relevant Medications   levothyroxine (SYNTHROID) 112 MCG tablet   Other Relevant Orders   TSH (Completed)     Other   PTSD (post-traumatic stress disorder)    Chronic.  PHQ-9 and GAD-7 significantly elevated today.  Denies HI.  Does endorse thoughts that she would be better off dead, however no specific plan to intent to harm self.  Contracted for safety.  She has self discontinued her medications and has stopped seeing Psychiatrist.  We discussed the importance of this and she is agreeable to start seeing Psychiatry again.  I have placed an urgent referral.  In the meantime, if she has intent  or a specific plan to harm herself, she should call suicide hotline, 911, or go to ER.  Also continue collaboration with Therapist in the meantime.      Relevant Orders   Ambulatory referral to Psychiatry   Panic disorder    Chronic.  PHQ-9 and GAD-7 significantly elevated today.  Denies HI.  Does endorse thoughts that she would be better off dead, however no specific plan to intent to harm self.  Contracted for safety.  She has self discontinued her medications and has stopped seeing Psychiatrist.  We discussed the importance of this and she is agreeable to start seeing Psychiatry again.  I have placed an urgent referral.  In the meantime, if she has intent or a specific plan to harm herself, she should call suicide hotline, 911, or go to ER.  Also continue collaboration with Therapist in the meantime.       Mood disorder ([place]) - Primary    Chronic.  PHQ-9 and GAD-7 significantly elevated today.  Denies HI.  Does endorse thoughts that she would be better off dead, however no specific plan to intent to harm self.  Contracted for safety.  She has self discontinued her medications and has stopped seeing Psychiatrist.  We discussed the importance of this and she is agreeable to start seeing Psychiatry again.  I have placed an urgent referral.  In the meantime, if she has intent or a specific plan to harm herself, she should call suicide hotline, 911, or go to ER.  Also continue collaboration with Therapist in the meantime.       Chronic bilateral low back pain without sciatica    Chronic.  X-ray imaging in 2016 of lumbar spine was negative.  I do suspect this may be attributing to some of her leg numbness.  I have advised the patient to schedule an appointment with her Orthopedist to see if further imaging is now indicated.        Relevant Medications   ibuprofen (ADVIL) 800 MG tablet   Borderline diabetic    Chronic.  Check HgbA1c today.  Continue metformin 500 mg daily with breakfast for now.   Follow up in 3 months.       Relevant Medications   metFORMIN (GLUCOPHAGE) 500 MG tablet   Anxiety    Chronic.  PHQ-9 and GAD-7 significantly elevated today.  Denies HI.  Does endorse thoughts that she would be better off dead, however no specific plan to intent to harm self.  Contracted for safety.  She has self discontinued her medications and has stopped seeing Psychiatrist.  We discussed the importance of this and she  is agreeable to start seeing Psychiatry again.  I have placed an urgent referral.  In the meantime, if she has intent or a specific plan to harm herself, she should call suicide hotline, 911, or go to ER.  Also continue collaboration with Therapist in the meantime.      Other Visit Diagnoses     Pain of finger of left hand       Acute, improving.  Suspect hyperextention of left fifth digit with fall.  No acute abnormality on examination today.  Continue to monitor as long as improving.         Follow up plan: Return in about 3 months (around 10/29/2021) for HTN, mood f/u.

## 2021-07-31 LAB — CBC WITH DIFFERENTIAL/PLATELET
Absolute Monocytes: 485 cells/uL (ref 200–950)
Basophils Absolute: 19 cells/uL (ref 0–200)
Basophils Relative: 0.3 %
Eosinophils Absolute: 63 cells/uL (ref 15–500)
Eosinophils Relative: 1 %
HCT: 39.9 % (ref 35.0–45.0)
Hemoglobin: 13 g/dL (ref 11.7–15.5)
Lymphs Abs: 3062 cells/uL (ref 850–3900)
MCH: 28.6 pg (ref 27.0–33.0)
MCHC: 32.6 g/dL (ref 32.0–36.0)
MCV: 87.7 fL (ref 80.0–100.0)
MPV: 10.8 fL (ref 7.5–12.5)
Monocytes Relative: 7.7 %
Neutro Abs: 2671 cells/uL (ref 1500–7800)
Neutrophils Relative %: 42.4 %
Platelets: 266 10*3/uL (ref 140–400)
RBC: 4.55 10*6/uL (ref 3.80–5.10)
RDW: 14.6 % (ref 11.0–15.0)
Total Lymphocyte: 48.6 %
WBC: 6.3 10*3/uL (ref 3.8–10.8)

## 2021-07-31 LAB — LIPID PANEL
Cholesterol: 207 mg/dL — ABNORMAL HIGH (ref <200)
HDL: 72 mg/dL (ref >=50)
LDL Cholesterol (Calc): 116 mg/dL (calc) — ABNORMAL HIGH
Non-HDL Cholesterol (Calc): 135 mg/dL (calc) — ABNORMAL HIGH (ref <130)
Total CHOL/HDL Ratio: 2.9 (calc) (ref <5.0)
Triglycerides: 89 mg/dL (ref <150)

## 2021-07-31 LAB — TSH: TSH: 3.08 mIU/L

## 2021-07-31 LAB — COMPLETE METABOLIC PANEL WITH GFR
AG Ratio: 1.3 (calc) (ref 1.0–2.5)
ALT: 12 U/L (ref 6–29)
AST: 9 U/L — ABNORMAL LOW (ref 10–35)
Albumin: 4 g/dL (ref 3.6–5.1)
Alkaline phosphatase (APISO): 66 U/L (ref 31–125)
BUN: 12 mg/dL (ref 7–25)
CO2: 25 mmol/L (ref 20–32)
Calcium: 9.1 mg/dL (ref 8.6–10.2)
Chloride: 107 mmol/L (ref 98–110)
Creat: 0.9 mg/dL (ref 0.50–0.99)
Globulin: 3 g/dL (calc) (ref 1.9–3.7)
Glucose, Bld: 102 mg/dL — ABNORMAL HIGH (ref 65–99)
Potassium: 4.1 mmol/L (ref 3.5–5.3)
Sodium: 140 mmol/L (ref 135–146)
Total Bilirubin: 0.4 mg/dL (ref 0.2–1.2)
Total Protein: 7 g/dL (ref 6.1–8.1)
eGFR: 80 mL/min/{1.73_m2} (ref >=60)

## 2021-07-31 LAB — HEMOGLOBIN A1C
Hgb A1c MFr Bld: 6 % of total Hgb — ABNORMAL HIGH (ref <5.7)
Mean Plasma Glucose: 126 mg/dL
eAG (mmol/L): 7 mmol/L

## 2021-08-02 DIAGNOSIS — G8929 Other chronic pain: Secondary | ICD-10-CM | POA: Insufficient documentation

## 2021-08-02 NOTE — Assessment & Plan Note (Signed)
Chronic.  PHQ-9 and GAD-7 significantly elevated today.  Denies HI.  Does endorse thoughts that she would be better off dead, however no specific plan to intent to harm self.  Contracted for safety.  She has self discontinued her medications and has stopped seeing Psychiatrist.  We discussed the importance of this and she is agreeable to start seeing Psychiatry again.  I have placed an urgent referral.  In the meantime, if she has intent or a specific plan to harm herself, she should call suicide hotline, 911, or go to ER.  Also continue collaboration with Therapist in the meantime.

## 2021-08-02 NOTE — Assessment & Plan Note (Signed)
Will check TSH today. Follow up 6 months as long as it is stable.

## 2021-08-02 NOTE — Assessment & Plan Note (Signed)
Chronic.  BP elevated above goal today in clinic.  I suspect this may in part be secondary to her uncontrolled mood.  I have asked the patient to check her blood pressure at the pharmacy a few times per week.  I'd like her to write down the findings and report them to me in 2 weeks.  Goal is less than 130/80.  Follow up in 3 months or sooner if blood pressure above goal at home.  CBC, electrolytes with kidney function, and TSH checked today.

## 2021-08-02 NOTE — Assessment & Plan Note (Signed)
Chronic.  X-ray imaging in 2016 of lumbar spine was negative.  I do suspect this may be attributing to some of her leg numbness.  I have advised the patient to schedule an appointment with her Orthopedist to see if further imaging is now indicated.

## 2021-08-02 NOTE — Assessment & Plan Note (Signed)
Chronic.  Check HgbA1c today.  Continue metformin 500 mg daily with breakfast for now.  Follow up in 3 months.

## 2021-08-02 NOTE — Assessment & Plan Note (Signed)
Chronic.  Discussed use of SABA at length today.  It sounds like she is having frequent panic attacks, which trigger hyperventilation and prompt her to use her rescue inhaler.  She understands that the rescue inhaler is not meant to be used daily and that we need to get her mood under better control.

## 2021-08-31 ENCOUNTER — Telehealth: Payer: Self-pay | Admitting: Nurse Practitioner

## 2021-08-31 NOTE — Telephone Encounter (Signed)
Patient left voicemail message to request call back; has questions about a payment. Please advise at [phone].

## 2021-09-08 NOTE — Telephone Encounter (Signed)
I have left vm asking pt to return my call. I am not sure what payment she is referencing.  CB# [phone]

## 2021-10-29 ENCOUNTER — Encounter: Payer: Self-pay | Admitting: Nurse Practitioner

## 2021-10-29 ENCOUNTER — Ambulatory Visit (INDEPENDENT_AMBULATORY_CARE_PROVIDER_SITE_OTHER): Payer: Self-pay | Admitting: Nurse Practitioner

## 2021-10-29 ENCOUNTER — Telehealth: Payer: Self-pay | Admitting: *Deleted

## 2021-10-29 ENCOUNTER — Other Ambulatory Visit: Payer: Self-pay

## 2021-10-29 VITALS — BP 124/88 | HR 68 | Temp 98.0°F | Ht 62.0 in | Wt 257.0 lb

## 2021-10-29 DIAGNOSIS — I10 Essential (primary) hypertension: Secondary | ICD-10-CM

## 2021-10-29 DIAGNOSIS — Z1211 Encounter for screening for malignant neoplasm of colon: Secondary | ICD-10-CM

## 2021-10-29 DIAGNOSIS — Z1231 Encounter for screening mammogram for malignant neoplasm of breast: Secondary | ICD-10-CM

## 2021-10-29 DIAGNOSIS — F39 Unspecified mood [affective] disorder: Secondary | ICD-10-CM

## 2021-10-29 NOTE — Chronic Care Management (AMB) (Signed)
°  Care Management   Note  10/29/2021 Name: DAYLYN CHRISTINE MRN: [id number] DOB: September 11, 1976  [name] is a 45 y.o. year old female who is a primary care patient of Eulogio Bear, NP. I reached out to [name] by phone today in response to a referral sent by Ms. Irineo Axon Guggenheim's primary care provider.   Ms. Byers was given information about care management services today including:  Care management services include personalized support from designated clinical staff supervised by her physician, including individualized plan of care and coordination with other care providers 24/7 contact phone numbers for assistance for urgent and routine care needs. The patient may stop care management services at any time by phone call to the office staff.  Patient agreed to services and verbal consent obtained.   Follow up plan: Telephone appointment with care management team member scheduled for: 11/12/2021  Julian Hy, [place] [facility]  Direct Dial: [phone]

## 2021-10-29 NOTE — Patient Instructions (Signed)
[address] [place], [place] [address]

## 2021-10-29 NOTE — Progress Notes (Signed)
Subjective:    Patient ID: Janice Roberts, female    DOB: May 08, 1976, 45 y.o.   MRN: [id number]  HPI: Janice Roberts is a 46 y.o. female presenting for follow up.  Chief Complaint  Patient presents with   Hypertension   Diabetes   HYPERTENSION Patient reports she had checked her blood pressure a few times at home and took pictures of the readings.  When her phone updated, the pictures got deleted.  She does not accurately recall the readings.  She is taking benazepril 20 mg daily and amlodipine 5 mg daily well. Hypertension status: controlled  BP monitoring frequency:  a few times a week Medication compliance: excellent Aspirin: no Recurrent headaches: no Visual changes: no Palpitations: no Dyspnea: no Chest pain: no Lower extremity edema: no Dizzy/lightheaded: no   Mood Disorder - patient reports she has not heard back from Psychiatry.  She is requesting I prescribe medication that is not so sedating.  She continues to follow with Therapist.  She is also in the process of trying to obtain insurance.  She worries that she will not be able to afford to see Psychiatry long term.  Allergies  Allergen Reactions   Ivp Dye [Iodinated Diagnostic Agents] Anaphylaxis and Other (See Comments)    "Almost died"   Latex Swelling and Other (See Comments)    irritation   Morphine And Related Hives   Oxycodone Hives   Penicillins Hives and Other (See Comments)    Has patient had a PCN reaction causing immediate rash, facial/tongue/throat swelling, SOB or lightheadedness with hypotension: No Has patient had a PCN reaction causing severe rash involving mucus membranes or skin necrosis: Yes Has patient had a PCN reaction that required hospitalization No Has patient had a PCN reaction occurring within the last 10 years: No If all of the above answers are "NO", then may proceed with Cephalosporin use.    Percocet [Oxycodone-Acetaminophen] Itching    Outpatient Encounter  Medications as of 10/29/2021  Medication Sig   amLODipine (NORVASC) 5 MG tablet Take 1 tablet (5 mg total) by mouth daily.   benazepril (LOTENSIN) 20 MG tablet Take 1 tablet (20 mg total) by mouth daily.   clonazePAM (KLONOPIN) 1 MG tablet Take 1 mg by mouth 2 (two) times daily.   levothyroxine (SYNTHROID) 112 MCG tablet Take 1 tablet (112 mcg total) by mouth daily.   metFORMIN (GLUCOPHAGE) 500 MG tablet Take 1 tablet (500 mg total) by mouth daily with breakfast.   albuterol (VENTOLIN HFA) 108 (90 Base) MCG/ACT inhaler Inhale 1-2 puffs into the lungs every 6 (six) hours as needed for wheezing or shortness of breath.   baclofen (LIORESAL) 10 MG tablet Take 0.5-1 tablets (5-10 mg total) by mouth at bedtime as needed for muscle spasms. (Patient not taking: Reported on 07/30/2021)   cetirizine (ZYRTEC ALLERGY) 10 MG tablet Take 1 tablet (10 mg total) by mouth daily.   famotidine (PEPCID) 20 MG tablet Take 1 tablet (20 mg total) by mouth 2 (two) times daily. (Patient not taking: Reported on 07/30/2021)   fluticasone (FLONASE) 50 MCG/ACT nasal spray Place 1 spray into both nostrils 2 (two) times daily as needed for allergies or rhinitis.   ibuprofen (ADVIL) 800 MG tablet Take 1 tablet (800 mg total) by mouth 3 (three) times daily.   methocarbamol (ROBAXIN) 500 MG tablet Take 1 tablet (500 mg total) by mouth 2 (two) times daily. (Patient not taking: Reported on 07/30/2021)   Multiple Vitamins-Calcium (ONE-A-DAY WITHIN PO)  Take 1 tablet by mouth daily.   Tetrahydrozoline HCl (VISINE OP) Place 1 Drop/kg into both eyes daily as needed (dry eyes).   [DISCONTINUED] SUMAtriptan (IMITREX) 100 MG tablet Take 1/2 to 1 full tablet at onset of headache (Patient taking differently: Take 100 mg by mouth every 2 (two) hours as needed for migraine.)   No facility-administered encounter medications on file as of 10/29/2021.    Patient Active Problem List   Diagnosis Date Noted   Chronic bilateral low  back pain without sciatica 08/02/2021   Migraines 10/02/2020   Other insomnia 02/27/2020   Snoring 02/27/2020   Sicca syndrome, unspecified 01/30/2020   Mood disorder ([place]) 01/30/2020   Panic disorder 12/13/2019   Seasonal allergies 11/30/2019   Borderline diabetic 09/05/2019   PTSD (post-traumatic stress disorder) 07/13/2019   Endometriosis determined by laparoscopy 02/11/2017   PID (acute pelvic inflammatory disease) 11/17/2016   Pelvic pain 11/15/2016   Family history of breast cancer in mother 10/21/2016   Pelvic pain in female 11/10/2015   Anxiety    Depression    Asthma    Class 3 obesity ([place])    Hypertension    Thyroid disease     Past Medical History:  Diagnosis Date   Anxiety    no meds   Asthma    rarely uses inhaler - seasonal with allergies   Depression    no med   Hypertension    Hypothyroidism    Obesity    Seasonal allergies    SVD (spontaneous vaginal delivery) 1997   x    Thyroid disease     Relevant past medical, surgical, family and social history reviewed and updated as indicated. Interim medical history since our last visit reviewed.  Review of Systems Per HPI unless specifically indicated above     Objective:    BP 124/88    Pulse 68    Temp 98 F (36.7 C)    Ht 5\' 2"  (1.575 m)    Wt 257 lb (116.6 kg)    LMP 09/30/2017    SpO2 100%    BMI 47.01 kg/m   Wt Readings from Last 3 Encounters:  10/29/21 257 lb (116.6 kg)  07/30/21 255 lb (115.7 kg)  01/27/21 254 lb (115.2 kg)    Physical Exam Vitals reviewed.  Constitutional:      Appearance: Normal appearance.  Cardiovascular:     Rate and Rhythm: Normal rate and regular rhythm.     Heart sounds: No murmur heard. Pulmonary:     Effort: Pulmonary effort is normal. No respiratory distress.     Breath sounds: Normal breath sounds. No wheezing, rhonchi or rales.  Skin:    General: Skin is warm and dry.     Coloration: Skin is not jaundiced or pale.      Findings: No erythema.  Neurological:     Mental Status: She is alert and oriented to person, place, and time.  Psychiatric:        Mood and Affect: Mood normal.        Behavior: Behavior normal.        Thought Content: Thought content normal.        Judgment: Judgment normal.       Assessment & Plan:   Problem List Items Addressed This Visit       Cardiovascular and Mediastinum   Hypertension    Blood pressure is well controlled today in the office.  I have asked the patient to continue to  monitor her blood pressure at home and notify me with the readings.  Blood pressure goal less than 130/80.  Continue benazepril 20 mg daily and amlodipine 5 mg daily.  Recent kidney function with electrolytes normal.  Follow-up 3 months.        Other   Mood disorder ([place]) - Primary    Chronic.  Patient reports she has not heard from psychiatry.  We will check with referral coordinator to see if we can get an update.  In the meantime, I discussed mental health clinic in [facility] that the patient can walk-in at 24/7.  I encouraged her to go there if her mood worsens.  We discussed that I am not comfortable prescribing alternate medication for her anxiety to help her mood.  She has tried numerous medications in the past.  Continue collaboration with therapist.  I have also placed a referral to social worker in our office to help close care gaps.      Relevant Orders   AMB Referral to [facility]   Other Visit Diagnoses     Encounter for screening mammogram for malignant neoplasm of breast       Relevant Orders   MM 3D SCREEN BREAST BILATERAL   Screening for colon cancer       Relevant Orders   Ambulatory referral to Gastroenterology        Follow up plan: Return in about 3 months (around 01/27/2022) for Mood, hypertension, prediabetes.

## 2021-10-29 NOTE — Assessment & Plan Note (Signed)
Blood pressure is well controlled today in the office.  I have asked the patient to continue to monitor her blood pressure at home and notify me with the readings.  Blood pressure goal less than 130/80.  Continue benazepril 20 mg daily and amlodipine 5 mg daily.  Recent kidney function with electrolytes normal.  Follow-up 3 months.

## 2021-10-29 NOTE — Assessment & Plan Note (Signed)
Chronic.  Patient reports she has not heard from psychiatry.  We will check with referral coordinator to see if we can get an update.  In the meantime, I discussed mental health clinic in [facility] that the patient can walk-in at 24/7.  I encouraged her to go there if her mood worsens.  We discussed that I am not comfortable prescribing alternate medication for her anxiety to help her mood.  She has tried numerous medications in the past.  Continue collaboration with therapist.  I have also placed a referral to social worker in our office to help close care gaps.

## 2021-11-12 ENCOUNTER — Telehealth: Payer: Medicaid Other

## 2021-11-13 ENCOUNTER — Ambulatory Visit: Payer: Medicare Other | Admitting: *Deleted

## 2021-11-13 DIAGNOSIS — F32A Depression, unspecified: Secondary | ICD-10-CM

## 2021-11-13 DIAGNOSIS — F419 Anxiety disorder, unspecified: Secondary | ICD-10-CM

## 2021-11-13 NOTE — Patient Instructions (Signed)
Visit Information   Thank you for taking time to visit with me today. Please don't hesitate to contact me if I can be of assistance to you before our next scheduled telephone appointment.  Following are the goals we discussed today:  (Copy and paste patient goals from clinical care plan here)  Our next appointment is by telephone on 11/18/21 at [facility]  Please call the care guide team at [phone] if you need to cancel or reschedule your appointment.   If you are experiencing a Mental Health or [place] or need someone to talk to, please call the Suicide and Crisis Lifeline: 988 call the [place] National Suicide Prevention Lifeline: 972 005 1559 or TTY: 928-331-1051 TTY 805-429-8366) to talk to a trained counselor call 1-800-273-TALK (toll free, 24 hour hotline) go to [facility] [address] [place] [id number]) call the [facility]: [phone] call 911   Following is a copy of your full care plan:  Care Plan : LCSW Plan of Care  Updates made by Deirdre Peer, LCSW since 11/13/2021 12:00 AM     Problem: Symptoms (Depression)   Priority: High     Long-Range Goal: Provide support and link with resources to aide in depression, financial strains and QOL   Start Date: 11/13/2021  Expected End Date: 02/04/2022  This Visit's Progress: On track  Priority: High  Note:   Current barriers:   Chronic Mental Health needs related to depression Financial constraints related to limited income/no health insurance, Limited social support, Medication procurement, Mental Health Concerns , and Lacks knowledge of community resource:   Needs Support, Education, and Care Coordination in order to meet unmet mental health needs. Clinical Goal(s): explore community resource options for unmet needs related to:  Financial Strain  and Depression    Clinical Interventions:  Initial call to pt today- pt acknowledges depression and  anxiety with panic attacks for most of her life. Pt states she feels depressed "every day".  CSW completed a depression screening with pt and found her to score high/severe at 23. Pt denies current SI/HI and shares a past history of SI and attempts- most recently hospitalized about 5 years ago. Pt has been seeing [facility], for those 5 years since then, Pt currently sees her 2 times monthly and has a good rapport with her. Pt gave CSW permission to outreach Ivin Booty to collaborate and provided her #. CSW made contact with Ivin Booty and was able to share current concerns with her. Pt is currently not taking any psych meds due to cost. CSW talked with her about seeking guidance through the county clinic where they are able to work with her on a sliding scale basis. Will send info to her for review and consideration. Pt also states she has been trying to find a Psychiatrist but she cannot afford-  she is employed (making minimal pay) and no benefits/insurance.  Pt reports her counselor is helping her to seek possible insurance options she can afford-  Pt reports good support from her family-  She is seeing her counselor today and is encouraged to discuss above as well with her.  CSW will inquire with PCP and embedded Pharmacist and RNCM for options as well.   Depression screen [facility] 2/9 11/13/2021 07/30/2021 12/15/2020 01/09/2020 07/13/2019  Decreased Interest 3 3 0 3 3  Down, Depressed, Hopeless 3 3 0 3 3  PHQ - 2 Score 6 6 0 6 6  Altered sleeping 2 3 -  3 1  Tired, decreased energy 3 3 - 3 2  Change in appetite 1 2 - 0 2  Feeling bad or failure about yourself  3 3 - 1 2  Trouble concentrating 2 2 - 1 1  Moving slowly or fidgety/restless 3 2 - 0 1  Suicidal thoughts 3 3 - 1 2  PHQ-9 Score 23 24 - 15 17  Difficult doing work/chores - Somewhat difficult - Very difficult Very difficult  Some recent data might be hidden    Assessed patient's previous and current treatment, coping skills, support system and  barriers to care  Solution-Focused Strategies employed:   Mindfulness or Relaxation training provided Active listening / Reflection utilized  Emotional Support Provided Provided psychoeducation for mental health needs  Reviewed mental health medications with patient and discussed importance of compliance:  Participation in counseling encouraged  Collaborated with Letitia Neri, pt's personal counselor x 5 years  ; Review resources, discussed options and provided patient information about  Referral to care guide (Mental Health resources,  [facility])  Financial hardship resource options 1:1 collaboration with primary care provider regarding development and update of comprehensive plan of care as evidenced by provider attestation and co-signature Inter-disciplinary care team collaboration (see longitudinal plan of care) Patient Goals/Self-Care Activities: Over the next 30 days Continue with therapy Continue with compliance of taking medication  Continue with your self-care action plan ( ) I have placed a referral to the community resource care guide they will call you with resources        Consent to CCM Services: Ms. Deemer was given information about Chronic Care Management services including:  CCM service includes personalized support from designated clinical staff supervised by her physician, including individualized plan of care and coordination with other care providers 24/7 contact phone numbers for assistance for urgent and routine care needs. Service will only be billed when office clinical staff spend 20 minutes or more in a month to coordinate care. Only one practitioner may furnish and bill the service in a calendar month. The patient may stop CCM services at any time (effective at the end of the month) by phone call to the office staff. The patient will be responsible for cost sharing (co-pay) of up to 20% of the service fee (after annual deductible is met).  Patient agreed to  services and verbal consent obtained.   Patient verbalizes understanding of instructions provided today and agrees to view in [place].   Telephone follow up appointment with care management team member scheduled for:

## 2021-11-13 NOTE — Chronic Care Management (AMB) (Signed)
Chronic Care Management    Clinical Social Work Note  11/13/2021 Name: Janice Roberts MRN: [id number] DOB: 27-Apr-1976  Janice Roberts is a 46 y.o. year old female who is a primary care patient of Janice Bear, NP. The CCM team was consulted to assist the patient with chronic disease management and/or care coordination needs related to: [employer] , Level of Care Concerns, Mental Health Counseling and Resources, and Financial Difficulties related to no insurance/limited income .   Engaged with patient by telephone for initial visit in response to provider referral for social work chronic care management and care coordination services.   Consent to Services:  The patient was given information about Chronic Care Management services, agreed to services, and gave verbal consent prior to initiation of services.  Please see initial visit note for detailed documentation.   Patient agreed to services and consent obtained.   Assessment: Review of patient past medical history, allergies, medications, and health status, including review of relevant consultants reports was performed today as part of a comprehensive evaluation and provision of chronic care management and care coordination services.     SDOH (Social Determinants of Health) assessments and interventions performed:  SDOH Interventions    Flowsheet Row Most Recent Value  SDOH Interventions   Depression Interventions/Treatment  Counseling, Medication, Referral to Psychiatry, Currently on Treatment        Advanced Directives Status: Not addressed in this encounter.  CCM Care Plan  Allergies  Allergen Reactions   Ivp Dye [Iodinated Contrast Media] Anaphylaxis and Other (See Comments)    "Almost died"   Latex Swelling and Other (See Comments)    irritation   Morphine And Related Hives   Oxycodone Hives   Penicillins Hives and Other (See Comments)    Has patient had a PCN reaction causing immediate rash,  facial/tongue/throat swelling, SOB or lightheadedness with hypotension: No Has patient had a PCN reaction causing severe rash involving mucus membranes or skin necrosis: Yes Has patient had a PCN reaction that required hospitalization No Has patient had a PCN reaction occurring within the last 10 years: No If all of the above answers are "NO", then may proceed with Cephalosporin use.    Percocet [Oxycodone-Acetaminophen] Itching    Outpatient Encounter Medications as of 11/13/2021  Medication Sig   clonazePAM (KLONOPIN) 1 MG tablet Take 1 mg by mouth 2 (two) times daily.   albuterol (VENTOLIN HFA) 108 (90 Base) MCG/ACT inhaler Inhale 1-2 puffs into the lungs every 6 (six) hours as needed for wheezing or shortness of breath.   amLODipine (NORVASC) 5 MG tablet Take 1 tablet (5 mg total) by mouth daily.   baclofen (LIORESAL) 10 MG tablet Take 0.5-1 tablets (5-10 mg total) by mouth at bedtime as needed for muscle spasms. (Patient not taking: Reported on 07/30/2021)   benazepril (LOTENSIN) 20 MG tablet Take 1 tablet (20 mg total) by mouth daily.   cetirizine (ZYRTEC ALLERGY) 10 MG tablet Take 1 tablet (10 mg total) by mouth daily.   famotidine (PEPCID) 20 MG tablet Take 1 tablet (20 mg total) by mouth 2 (two) times daily. (Patient not taking: Reported on 07/30/2021)   fluticasone (FLONASE) 50 MCG/ACT nasal spray Place 1 spray into both nostrils 2 (two) times daily as needed for allergies or rhinitis.   ibuprofen (ADVIL) 800 MG tablet Take 1 tablet (800 mg total) by mouth 3 (three) times daily.   levothyroxine (SYNTHROID) 112 MCG tablet Take 1 tablet (112 mcg total) by mouth daily.  metFORMIN (GLUCOPHAGE) 500 MG tablet Take 1 tablet (500 mg total) by mouth daily with breakfast.   methocarbamol (ROBAXIN) 500 MG tablet Take 1 tablet (500 mg total) by mouth 2 (two) times daily. (Patient not taking: Reported on 07/30/2021)   Multiple Vitamins-Calcium (ONE-A-DAY WITHIN PO) Take 1 tablet by mouth daily.    Tetrahydrozoline HCl (VISINE OP) Place 1 Drop/kg into both eyes daily as needed (dry eyes).   [DISCONTINUED] SUMAtriptan (IMITREX) 100 MG tablet Take 1/2 to 1 full tablet at onset of headache (Patient taking differently: Take 100 mg by mouth every 2 (two) hours as needed for migraine.)   No facility-administered encounter medications on file as of 11/13/2021.    Patient Active Problem List   Diagnosis Date Noted   Chronic bilateral low back pain without sciatica 08/02/2021   Migraines 10/02/2020   Other insomnia 02/27/2020   Snoring 02/27/2020   Sicca syndrome, unspecified 01/30/2020   Mood disorder ([place]) 01/30/2020   Panic disorder 12/13/2019   Seasonal allergies 11/30/2019   Borderline diabetic 09/05/2019   PTSD (post-traumatic stress disorder) 07/13/2019   Endometriosis determined by laparoscopy 02/11/2017   PID (acute pelvic inflammatory disease) 11/17/2016   Pelvic pain 11/15/2016   Family history of breast cancer in mother 10/21/2016   Pelvic pain in female 11/10/2015   Anxiety    Depression    Asthma    Class 3 obesity ([place])    Hypertension    Thyroid disease     Conditions to be addressed/monitored: Depression; Financial constraints related to uninsured/limited income and Mental Health Concerns   Care Plan : LCSW Plan of Care  Updates made by Janice Peer, LCSW since 11/13/2021 12:00 AM     Problem: Symptoms (Depression)   Priority: High     Long-Range Goal: Provide support and link with resources to aide in depression, financial strains and QOL   Start Date: 11/13/2021  Expected End Date: 02/04/2022  This Visit's Progress: On track  Priority: High  Note:   Current barriers:   Chronic Mental Health needs related to depression Financial constraints related to limited income/no health insurance, Limited social support, Medication procurement, Mental Health Concerns , and Lacks knowledge of community resource:   Needs Support, Education, and Care Coordination in  order to meet unmet mental health needs. Clinical Goal(s): explore community resource options for unmet needs related to:  Financial Strain  and Depression    Clinical Interventions:  Initial call to pt today- pt acknowledges depression and anxiety with panic attacks for most of her life. Pt states she feels depressed "every day".  CSW completed a depression screening with pt and found her to score high/severe at 23. Pt denies current SI/HI and shares a past history of SI and attempts- most recently hospitalized about 5 years ago. Pt has been seeing [employer], [facility], for those 5 years since then, Pt currently sees her 2 times monthly and has a good rapport with her. Pt gave CSW permission to outreach Ivin Booty to collaborate and provided her #. CSW made contact with Ivin Booty and was able to share current concerns with her. Pt is currently not taking any psych meds due to cost. CSW talked with her about seeking guidance through the county clinic where they are able to work with her on a sliding scale basis. Will send info to her for review and consideration. Pt also states she has been trying to find a Psychiatrist but she cannot afford-  she is employed (making minimal pay) and  no benefits/insurance.  Pt reports her counselor is helping her to seek possible insurance options she can afford-  Pt reports good support from her family-  She is seeing her counselor today and is encouraged to discuss above as well with her.  CSW will inquire with PCP and embedded Pharmacist and RNCM for options as well.   Depression screen [facility] 2/9 11/13/2021 07/30/2021 12/15/2020 01/09/2020 07/13/2019  Decreased Interest 3 3 0 3 3  Down, Depressed, Hopeless 3 3 0 3 3  PHQ - 2 Score 6 6 0 6 6  Altered sleeping 2 3 - 3 1  Tired, decreased energy 3 3 - 3 2  Change in appetite 1 2 - 0 2  Feeling bad or failure about yourself  3 3 - 1 2  Trouble concentrating 2 2 - 1 1  Moving slowly or fidgety/restless 3 2 - 0 1  Suicidal thoughts 3 3  - 1 2  PHQ-9 Score 23 24 - 15 17  Difficult doing work/chores - Somewhat difficult - Very difficult Very difficult  Some recent data might be hidden    Assessed patient's previous and current treatment, coping skills, support system and barriers to care  Solution-Focused Strategies employed:   Mindfulness or Relaxation training provided Active listening / Reflection utilized  Emotional Support Provided Provided psychoeducation for mental health needs  Reviewed mental health medications with patient and discussed importance of compliance:  Participation in counseling encouraged  Collaborated with Letitia Neri, pt's personal counselor x 5 years  ; Review resources, discussed options and provided patient information about  Referral to care guide (Mental Health resources,  [facility])  Financial hardship resource options 1:1 collaboration with primary care provider regarding development and update of comprehensive plan of care as evidenced by provider attestation and co-signature Inter-disciplinary care team collaboration (see longitudinal plan of care) Patient Goals/Self-Care Activities: Over the next 30 days Continue with therapy Continue with compliance of taking medication  Continue with your self-care action plan ( ) I have placed a referral to the community resource care guide they will call you with resources         Follow Up Plan: Appointment scheduled for SW to meet with client in provider office on:  and Appointment scheduled for SW follow up with client by phone on: 11/18/21      Eduard Clos MSW, [place] Licensed Clinical Social Worker [facility] [phone]

## 2021-11-18 ENCOUNTER — Ambulatory Visit: Payer: Medicare Other | Admitting: *Deleted

## 2021-11-18 ENCOUNTER — Telehealth: Payer: Self-pay | Admitting: Nurse Practitioner

## 2021-11-18 ENCOUNTER — Other Ambulatory Visit: Payer: Self-pay | Admitting: *Deleted

## 2021-11-18 DIAGNOSIS — F419 Anxiety disorder, unspecified: Secondary | ICD-10-CM

## 2021-11-18 DIAGNOSIS — R7303 Prediabetes: Secondary | ICD-10-CM

## 2021-11-18 DIAGNOSIS — J4520 Mild intermittent asthma, uncomplicated: Secondary | ICD-10-CM

## 2021-11-18 DIAGNOSIS — I10 Essential (primary) hypertension: Secondary | ICD-10-CM

## 2021-11-18 DIAGNOSIS — F32A Depression, unspecified: Secondary | ICD-10-CM

## 2021-11-18 DIAGNOSIS — F410 Panic disorder [episodic paroxysmal anxiety] without agoraphobia: Secondary | ICD-10-CM

## 2021-11-18 NOTE — Chronic Care Management (AMB) (Signed)
Chronic Care Management    Clinical Social Work Note  11/18/2021 Name: Janice Roberts MRN: [id number] DOB: 1976-05-24  Janice Roberts is a 46 y.o. year old female who is a primary care patient of Janice Bear, NP. The CCM team was consulted to assist the patient with chronic disease management and/or care coordination needs related to: Mental Health Counseling and Resources and Financial Difficulties related to limited income .   Engaged with patient by telephone for follow up visit in response to provider referral for social work chronic care management and care coordination services.   Consent to Services:  The patient was given information about Chronic Care Management services, agreed to services, and gave verbal consent prior to initiation of services.  Please see initial visit note for detailed documentation.   Patient agreed to services and consent obtained.   Assessment: Review of patient past medical history, allergies, medications, and health status, including review of relevant consultants reports was performed today as part of a comprehensive evaluation and provision of chronic care management and care coordination services.     SDOH (Social Determinants of Health) assessments and interventions performed:    Advanced Directives Status: Not addressed in this encounter.  CCM Care Plan  Allergies  Allergen Reactions   Ivp Dye [Iodinated Contrast Media] Anaphylaxis and Other (See Comments)    "Almost died"   Latex Swelling and Other (See Comments)    irritation   Morphine And Related Hives   Oxycodone Hives   Penicillins Hives and Other (See Comments)    Has patient had a PCN reaction causing immediate rash, facial/tongue/throat swelling, SOB or lightheadedness with hypotension: No Has patient had a PCN reaction causing severe rash involving mucus membranes or skin necrosis: Yes Has patient had a PCN reaction that required hospitalization No Has patient had a PCN  reaction occurring within the last 10 years: No If all of the above answers are "NO", then may proceed with Cephalosporin use.    Percocet [Oxycodone-Acetaminophen] Itching    Outpatient Encounter Medications as of 11/18/2021  Medication Sig   albuterol (VENTOLIN HFA) 108 (90 Base) MCG/ACT inhaler Inhale 1-2 puffs into the lungs every 6 (six) hours as needed for wheezing or shortness of breath.   amLODipine (NORVASC) 5 MG tablet Take 1 tablet (5 mg total) by mouth daily.   baclofen (LIORESAL) 10 MG tablet Take 0.5-1 tablets (5-10 mg total) by mouth at bedtime as needed for muscle spasms. (Patient not taking: Reported on 07/30/2021)   benazepril (LOTENSIN) 20 MG tablet Take 1 tablet (20 mg total) by mouth daily.   cetirizine (ZYRTEC ALLERGY) 10 MG tablet Take 1 tablet (10 mg total) by mouth daily.   clonazePAM (KLONOPIN) 1 MG tablet Take 1 mg by mouth 2 (two) times daily.   famotidine (PEPCID) 20 MG tablet Take 1 tablet (20 mg total) by mouth 2 (two) times daily. (Patient not taking: Reported on 07/30/2021)   fluticasone (FLONASE) 50 MCG/ACT nasal spray Place 1 spray into both nostrils 2 (two) times daily as needed for allergies or rhinitis.   ibuprofen (ADVIL) 800 MG tablet Take 1 tablet (800 mg total) by mouth 3 (three) times daily.   levothyroxine (SYNTHROID) 112 MCG tablet Take 1 tablet (112 mcg total) by mouth daily.   metFORMIN (GLUCOPHAGE) 500 MG tablet Take 1 tablet (500 mg total) by mouth daily with breakfast.   methocarbamol (ROBAXIN) 500 MG tablet Take 1 tablet (500 mg total) by mouth 2 (two) times daily. (Patient  not taking: Reported on 07/30/2021)   Multiple Vitamins-Calcium (ONE-A-DAY WITHIN PO) Take 1 tablet by mouth daily.   Tetrahydrozoline HCl (VISINE OP) Place 1 Drop/kg into both eyes daily as needed (dry eyes).   [DISCONTINUED] SUMAtriptan (IMITREX) 100 MG tablet Take 1/2 to 1 full tablet at onset of headache (Patient taking differently: Take 100 mg by mouth every 2 (two) hours  as needed for migraine.)   No facility-administered encounter medications on file as of 11/18/2021.    Patient Active Problem List   Diagnosis Date Noted   Chronic bilateral low back pain without sciatica 08/02/2021   Migraines 10/02/2020   Other insomnia 02/27/2020   Snoring 02/27/2020   Sicca syndrome, unspecified 01/30/2020   Mood disorder ([place]) 01/30/2020   Panic disorder 12/13/2019   Seasonal allergies 11/30/2019   Borderline diabetic 09/05/2019   PTSD (post-traumatic stress disorder) 07/13/2019   Endometriosis determined by laparoscopy 02/11/2017   PID (acute pelvic inflammatory disease) 11/17/2016   Pelvic pain 11/15/2016   Family history of breast cancer in mother 10/21/2016   Pelvic pain in female 11/10/2015   Anxiety    Depression    Asthma    Class 3 obesity ([place])    Hypertension    Thyroid disease     Conditions to be addressed/monitored: Depression; Mental Health Concerns   Care Plan : LCSW Plan of Care  Updates made by Deirdre Peer, LCSW since 11/18/2021 12:00 AM     Problem: Symptoms (Depression)   Priority: High     Long-Range Goal: Provide support and link with resources to aide in depression, financial strains and QOL   Start Date: 11/13/2021  Expected End Date: 02/04/2022  This Visit's Progress: On track  Recent Progress: On track  Priority: High  Note:   Current barriers:   Chronic Mental Health needs related to depression Financial constraints related to limited income/no health insurance, Limited social support, Medication procurement, Mental Health Concerns , and Lacks knowledge of community resource:   Needs Support, Education, and Care Coordination in order to meet unmet mental health needs. Clinical Goal(s): explore community resource options for unmet needs related to:  Financial Strain  and Depression    Clinical Interventions:   11/18/21- Pt reports her therapist helped her to apply for health insurance and she awaits determination.  CSW encouraged pt to continue to seek options as well as to seek employment opportunities that may offer more pay/income as well as benefits that include healthcare insurance. Pt also reports feeling somewhat better mental health wise- reminded her to follow up with her counselor as needed as well as the crisis line info provided.   Initial call to pt today- pt acknowledges depression and anxiety with panic attacks for most of her life. Pt states she feels depressed "every day".  CSW completed a depression screening with pt and found her to score high/severe at 23. Pt denies current SI/HI and shares a past history of SI and attempts- most recently hospitalized about 5 years ago. Pt has been seeing [employer], [facility], for those 5 years since then, Pt currently sees her 2 times monthly and has a good rapport with her. Pt gave CSW permission to outreach Ivin Booty to collaborate and provided her #. CSW made contact with Ivin Booty and was able to share current concerns with her. Pt is currently not taking any psych meds due to cost. CSW talked with her about seeking guidance through the county clinic where they are able to work with her on  a sliding scale basis. Will send info to her for review and consideration. Pt also states she has been trying to find a Psychiatrist but she cannot afford-  she is employed (making minimal pay) and no benefits/insurance.  Pt reports her counselor is helping her to seek possible insurance options she can afford-  Pt reports good support from her family-  She is seeing her counselor today and is encouraged to discuss above as well with her.  CSW will inquire with PCP and embedded Pharmacist and RNCM for options as well.   Depression screen [facility] 2/9 11/13/2021 07/30/2021 12/15/2020 01/09/2020 07/13/2019  Decreased Interest 3 3 0 3 3  Down, Depressed, Hopeless 3 3 0 3 3  PHQ - 2 Score 6 6 0 6 6  Altered sleeping 2 3 - 3 1  Tired, decreased energy 3 3 - 3 2  Change in appetite 1 2 - 0 2   Feeling bad or failure about yourself  3 3 - 1 2  Trouble concentrating 2 2 - 1 1  Moving slowly or fidgety/restless 3 2 - 0 1  Suicidal thoughts 3 3 - 1 2  PHQ-9 Score 23 24 - 15 17  Difficult doing work/chores - Somewhat difficult - Very difficult Very difficult  Some recent data might be hidden    Assessed patient's previous and current treatment, coping skills, support system and barriers to care  Solution-Focused Strategies employed:   Mindfulness or Relaxation training provided Active listening / Reflection utilized  Emotional Support Provided Provided psychoeducation for mental health needs  Reviewed mental health medications with patient and discussed importance of compliance:  Participation in counseling encouraged  Collaborated with Letitia Neri, pt's personal counselor x 5 years  ; Review resources, discussed options and provided patient information about  Referral to care guide (Mental Health resources,  [facility])  Financial hardship resource options 1:1 collaboration with primary care provider regarding development and update of comprehensive plan of care as evidenced by provider attestation and co-signature Inter-disciplinary care team collaboration (see longitudinal plan of care) Patient Goals/Self-Care Activities:    -continue with counseling support via [place] -consider other employment opportunities for increasing your income and with benefits -apply and seek insurance coverage  - begin a notebook of services in my neighborhood or community - call 211 when I need some help - follow-up on any referrals for help I am given - think ahead to make sure my need does not become an emergency - make a note about what I need to have by the phone or take with me, like an identification card or social security number have a back-up plan - have a back-up plan - make a list of family or friends that I can call           Follow Up Plan: Appointment scheduled for SW  follow up with client by phone on: 12/22/21      Eduard Clos MSW, [place] Licensed Clinical Social Worker [place] [phone]

## 2021-11-18 NOTE — Patient Instructions (Signed)
Visit Information  Thank you for taking time to visit with me today. Please don't hesitate to contact me if I can be of assistance to you before our next scheduled telephone appointment.  Following are the goals we discussed today:  (Copy and paste patient goals from clinical care plan here)  Our next appointment is by telephone on 12/22/21 at 2pm  Please call the care guide team at [phone] if you need to cancel or reschedule your appointment.   If you are experiencing a Mental Health or [place] or need someone to talk to, please call the Suicide and Crisis Lifeline: 988 call the [place] National Suicide Prevention Lifeline: 4425371802 or TTY: 9304192715 TTY 920-119-5011) to talk to a trained counselor call 1-800-273-TALK (toll free, 24 hour hotline) go to [facility] [address], [place] [phone]) call 911   Patient verbalizes understanding of instructions and care plan provided today and agrees to view in [place]. Active MyChart status confirmed with patient.    Eduard Clos MSW, LCSW Licensed Clinical Social Worker [place] [phone]

## 2021-11-18 NOTE — Progress Notes (Signed)
°  Chronic Care Management   Outreach Note  11/18/2021 Name: DELISSA SILBA MRN: [id number] DOB: 03/18/1976  Referred by: Eulogio Bear, NP Reason for referral : No chief complaint on file.   An unsuccessful telephone outreach was attempted today. The patient was referred to the pharmacist for assistance with care management and care coordination.   Follow Up Plan:   Tatjana Dellinger Upstream Scheduler

## 2021-11-19 ENCOUNTER — Telehealth: Payer: Self-pay

## 2021-11-19 NOTE — Telephone Encounter (Signed)
° °  Telephone encounter was:  Successful.  11/19/2021 Name: Janice Roberts MRN: [id number] DOB: [birth year]  Janice Roberts is a 46 y.o. year old female who is a primary care patient of Eulogio Bear, NP . The community resource team was consulted for assistance with  counseling and health insurance.  Care guide performed the following interventions: Spoke with patient verified email address [email or web address]. Emailed information for [employer], [facility], [place], [place] and [place].  Follow Up Plan:  Care guide will follow up with patient by phone over the next 4-31 days  Janice Roberts, AAS Paralegal, [place] [facility]  [address], [place] [address] ??[email or web address]   ?? [phone]   [email or web address]

## 2021-11-23 ENCOUNTER — Telehealth: Payer: Self-pay | Admitting: Nurse Practitioner

## 2021-11-23 NOTE — Chronic Care Management (AMB) (Signed)
°  Chronic Care Management   Outreach Note  11/23/2021 Name: Janice Roberts MRN: [id number] DOB: November 09, 1975  Referred by: Eulogio Bear, NP Reason for referral : No chief complaint on file.   A second unsuccessful telephone outreach was attempted today. The patient was referred to pharmacist for assistance with care management and care coordination.  Follow Up Plan:   Tatjana Dellinger Upstream Scheduler

## 2021-11-27 ENCOUNTER — Telehealth: Payer: Self-pay | Admitting: Nurse Practitioner

## 2021-11-27 NOTE — Chronic Care Management (AMB) (Signed)
°  Chronic Care Management   Outreach Note  11/27/2021 Name: Janice Roberts MRN: [id number] DOB: May 06, 1976  Referred by: Eulogio Bear, NP Reason for referral : No chief complaint on file.   Third unsuccessful telephone outreach was attempted today. The patient was referred to the pharmacist for assistance with care management and care coordination.   Follow Up Plan:   Tatjana Dellinger Upstream Scheduler

## 2021-12-01 ENCOUNTER — Telehealth: Payer: Self-pay | Admitting: Nurse Practitioner

## 2021-12-01 NOTE — Telephone Encounter (Signed)
Patient called to request refills of the following meds  benazepril (LOTENSIN) 20 MG tablet [[id number]]   levothyroxine (SYNTHROID) 112 MCG tablet [[id number]]   amLODipine (NORVASC) 5 MG tablet [[id number]]    Pharmacy confirmed as  [facility] [address] ([address] [place] [address]  [address] [place]) [place] [address]  Phone:  [phone]  Fax:  [phone]   Patient asked if appointment needed for provider to approve 3-6 months of refills to give patient time to find another provider.  Please advise at [phone]

## 2021-12-02 NOTE — Telephone Encounter (Signed)
[place]  Pt is not due for refills until March. Pt can have her pharmacy send refill requests once she is due. Per Janett Billow we will be refilling meds up to 3 months after she has left.

## 2021-12-03 ENCOUNTER — Telehealth: Payer: Self-pay

## 2021-12-03 NOTE — Telephone Encounter (Signed)
° °  Telephone encounter was:  Unsuccessful.  12/03/2021 Name: ROSHA COCKER MRN: [id number] DOB: 03/28/1976  Unsuccessful outbound call made today to assist with:   health insurance and counseling.  Outreach Attempt:  2nd Attempt  A HIPAA compliant voice message was left requesting a return call.  Instructed patient to call back at [phone].  Tanganika Barradas, AAS Paralegal, [facility]  [address], [place] [address] ??[email or web address]   ?? [phone]   [email or web address]

## 2021-12-15 ENCOUNTER — Telehealth: Payer: Self-pay

## 2021-12-15 NOTE — Telephone Encounter (Signed)
° °  Telephone encounter was:  Successful.  12/15/2021 Name: Janice Roberts MRN: [id number] DOB: Dec 31, 1975  Janice Roberts is a 46 y.o. year old female who is a primary care patient of Eulogio Bear, NP . The community resource team was consulted for assistance with  counseling and health insurance.  Care guide performed the following interventions: Spoke with patient she has received emailed resources for counseling, [employer] and [employer].  Follow Up Plan:  No further follow up planned at this time. The patient has been provided with needed resources.  Jalysa Swopes, AAS Paralegal, [facility]  [address], [place] [address] ??[email or web address]   ?? [phone]   [email or web address]

## 2021-12-22 ENCOUNTER — Telehealth: Payer: Medicare Other

## 2021-12-23 ENCOUNTER — Telehealth: Payer: Self-pay | Admitting: *Deleted

## 2021-12-23 NOTE — Telephone Encounter (Signed)
°  Care Management   Follow Up Note   12/23/2021 Name: Janice Roberts MRN: [id number] DOB: Nov 06, 1976   Referred by: Eulogio Bear, NP Reason for referral : No chief complaint on file.   An unsuccessful telephone outreach was attempted today. The patient was referred to the case management team for assistance with care management and care coordination.   Follow Up Plan: The care management team will reach out to the patient again over the next 10 days.  Eduard Clos MSW, LCSW Licensed Clinical Social Worker [place]    [phone]

## 2022-01-11 NOTE — Telephone Encounter (Signed)
Rescheduled 01/19/22 for fu  ? ?Laverda Sorenson  ?Care Guide, Embedded Care Coordination ?[facility] [facility]  [facility] Management  ?Direct Dial: [phone] ? ?

## 2022-01-19 ENCOUNTER — Ambulatory Visit (INDEPENDENT_AMBULATORY_CARE_PROVIDER_SITE_OTHER): Payer: Medicare Other | Admitting: *Deleted

## 2022-01-19 DIAGNOSIS — R7303 Prediabetes: Secondary | ICD-10-CM

## 2022-01-19 DIAGNOSIS — F419 Anxiety disorder, unspecified: Secondary | ICD-10-CM

## 2022-01-19 DIAGNOSIS — F32A Depression, unspecified: Secondary | ICD-10-CM

## 2022-01-19 DIAGNOSIS — F410 Panic disorder [episodic paroxysmal anxiety] without agoraphobia: Secondary | ICD-10-CM

## 2022-01-19 DIAGNOSIS — M545 Low back pain, unspecified: Secondary | ICD-10-CM

## 2022-01-19 NOTE — Patient Instructions (Signed)
Visit Information ? ?Thank you for taking time to visit with me today. Please don't hesitate to contact me if I can be of assistance to you  ?If you are experiencing a Mental Health or [place] or need someone to talk to, please call the [place] National Suicide Prevention Lifeline: (802)231-6554 or TTY: [phone] TTY [phone]) to talk to a trained counselor ?call 1-800-273-TALK (toll free, 24 hour hotline) ?go to [facility] [address] [place] [phone]) ?call 911  ? ?Patient verbalizes understanding of instructions and care plan provided today and agrees to view in [place]. Active MyChart status confirmed with patient.   ? ?Eduard Clos MSW, LCSW ?Licensed Clinical Social Worker ?[place]    ?[phone]  ?

## 2022-01-19 NOTE — Chronic Care Management (AMB) (Cosign Needed)
?Chronic Care Management  ? ? Clinical Social Work Note ? ?01/19/2022 ?Name: Janice Roberts MRN: [id number] DOB: [birth year] ? ?Janice Roberts is a 46 y.o. year old female who is a primary care patient of Eulogio Bear, NP. The CCM team was consulted to assist the patient with chronic disease management and/or care coordination needs related to: Mental Health Counseling and Resources.  ? ?Engaged with patient by telephone for follow up visit in response to provider referral for social work chronic care management and care coordination services.  ? ?Consent to Services:  ?The patient was given information about Chronic Care Management services, agreed to services, and gave verbal consent prior to initiation of services.  Please see initial visit note for detailed documentation.  ? ?Patient agreed to services and consent obtained.  ? ?Assessment: Review of patient past medical history, allergies, medications, and health status, including review of relevant consultants reports was performed today as part of a comprehensive evaluation and provision of chronic care management and care coordination services.    ? ?SDOH (Social Determinants of Health) assessments and interventions performed:   ? ?Advanced Directives Status: Not addressed in this encounter. ? ?CCM Care Plan ? ?Allergies  ?Allergen Reactions  ? Ivp Dye [Iodinated Contrast Media] Anaphylaxis and Other (See Comments)  ?  "Almost died"  ? Latex Swelling and Other (See Comments)  ?  irritation  ? Morphine And Related Hives  ? Oxycodone Hives  ? Penicillins Hives and Other (See Comments)  ?  Has patient had a PCN reaction causing immediate rash, facial/tongue/throat swelling, SOB or lightheadedness with hypotension: No ?Has patient had a PCN reaction causing severe rash involving mucus membranes or skin necrosis: Yes ?Has patient had a PCN reaction that required hospitalization No ?Has patient had a PCN reaction occurring within the last 10 years: No ?If all  of the above answers are "NO", then may proceed with Cephalosporin use. ?  ? Percocet [Oxycodone-Acetaminophen] Itching  ? ? ?Outpatient Encounter Medications as of 01/19/2022  ?Medication Sig  ? albuterol (VENTOLIN HFA) 108 (90 Base) MCG/ACT inhaler Inhale 1-2 puffs into the lungs every 6 (six) hours as needed for wheezing or shortness of breath.  ? amLODipine (NORVASC) 5 MG tablet Take 1 tablet (5 mg total) by mouth daily.  ? baclofen (LIORESAL) 10 MG tablet Take 0.5-1 tablets (5-10 mg total) by mouth at bedtime as needed for muscle spasms. (Patient not taking: Reported on 07/30/2021)  ? benazepril (LOTENSIN) 20 MG tablet Take 1 tablet (20 mg total) by mouth daily.  ? cetirizine (ZYRTEC ALLERGY) 10 MG tablet Take 1 tablet (10 mg total) by mouth daily.  ? clonazePAM (KLONOPIN) 1 MG tablet Take 1 mg by mouth 2 (two) times daily.  ? famotidine (PEPCID) 20 MG tablet Take 1 tablet (20 mg total) by mouth 2 (two) times daily. (Patient not taking: Reported on 07/30/2021)  ? fluticasone (FLONASE) 50 MCG/ACT nasal spray Place 1 spray into both nostrils 2 (two) times daily as needed for allergies or rhinitis.  ? ibuprofen (ADVIL) 800 MG tablet Take 1 tablet (800 mg total) by mouth 3 (three) times daily.  ? levothyroxine (SYNTHROID) 112 MCG tablet Take 1 tablet (112 mcg total) by mouth daily.  ? metFORMIN (GLUCOPHAGE) 500 MG tablet Take 1 tablet (500 mg total) by mouth daily with breakfast.  ? methocarbamol (ROBAXIN) 500 MG tablet Take 1 tablet (500 mg total) by mouth 2 (two) times daily. (Patient not taking: Reported on 07/30/2021)  ? Multiple  Vitamins-Calcium (ONE-A-DAY WITHIN PO) Take 1 tablet by mouth daily.  ? Tetrahydrozoline HCl (VISINE OP) Place 1 Drop/kg into both eyes daily as needed (dry eyes).  ? [DISCONTINUED] SUMAtriptan (IMITREX) 100 MG tablet Take 1/2 to 1 full tablet at onset of headache (Patient taking differently: Take 100 mg by mouth every 2 (two) hours as needed for migraine.)  ? ?No facility-administered  encounter medications on file as of 01/19/2022.  ? ? ?Patient Active Problem List  ? Diagnosis Date Noted  ? Chronic bilateral low back pain without sciatica 08/02/2021  ? Migraines 10/02/2020  ? Other insomnia 02/27/2020  ? Snoring 02/27/2020  ? Sicca syndrome, unspecified 01/30/2020  ? Mood disorder ([place]) 01/30/2020  ? Panic disorder 12/13/2019  ? Seasonal allergies 11/30/2019  ? Borderline diabetic 09/05/2019  ? PTSD (post-traumatic stress disorder) 07/13/2019  ? Endometriosis determined by laparoscopy 02/11/2017  ? PID (acute pelvic inflammatory disease) 11/17/2016  ? Pelvic pain 11/15/2016  ? Family history of breast cancer in mother 10/21/2016  ? Pelvic pain in female 11/10/2015  ? Anxiety   ? Depression   ? Asthma   ? Class 3 obesity (HCC)   ? Hypertension   ? Thyroid disease   ? ? ?Conditions to be addressed/monitored: Anxiety and Depression; Mental Health Concerns  ? ?Care Plan : LCSW Plan of Care  ?Updates made by Deirdre Peer, LCSW since 01/19/2022 12:00 AM  ?  ? ?Problem: Symptoms (Depression)   ?Priority: High  ?  ? ?Long-Range Goal: Provide support and link with resources to aide in depression, financial strains and QOL Completed 01/19/2022  ?Start Date: 11/13/2021  ?Expected End Date: 02/04/2022  ?This Visit's Progress: On track  ?Recent Progress: On track  ?Priority: High  ?Note:   ?Current barriers:   ?Chronic Mental Health needs related to depression ?Financial constraints related to limited income/no health insurance, Limited social support, Medication procurement, Mental Health Concerns , and Lacks knowledge of community resource:   ?Needs Support, Education, and Care Coordination in order to meet unmet mental health needs. ?Clinical Goal(s): explore community resource options for unmet needs related to:  Financial Strain  and Depression    ?Clinical Interventions:  ?01/19/22- Pt continues to see her therapist on a bi-weekly schedule. She admits to having some ups and downs. Pt feels her depression  and anxiety are being managed through current treatment and agrees to plans for CSW to sign off. Pt will reach out if needs arise.  ?Pt also shared plans to be seen by a new PCP next week, Encouraged her to discuss her depression and anxiety with the PCP.  ? ?11/18/21- Pt reports her therapist helped her to apply for health insurance and she awaits determination. CSW encouraged pt to continue to seek options as well as to seek employment opportunities that may offer more pay/income as well as benefits that include healthcare insurance. Pt also reports feeling somewhat better mental health wise- reminded her to follow up with her counselor as needed as well as the crisis line info provided.  ? ?Initial call to pt today- pt acknowledges depression and anxiety with panic attacks for most of her life. Pt states she feels depressed "every day".  CSW completed a depression screening with pt and found her to score high/severe at 23. Pt denies current SI/HI and shares a past history of SI and attempts- most recently hospitalized about 5 years ago. Pt has been seeing [employer], for those 5 years since then, Pt currently sees her  2 times monthly and has a good rapport with her. Pt gave CSW permission to outreach Ivin Booty to collaborate and provided her #. CSW made contact with Ivin Booty and was able to share current concerns with her. Pt is currently not taking any psych meds due to cost. CSW talked with her about seeking guidance through the county clinic where they are able to work with her on a sliding scale basis. Will send info to her for review and consideration. Pt also states she has been trying to find a Psychiatrist but she cannot afford-  she is employed (making minimal pay) and no benefits/insurance.  Pt reports her counselor is helping her to seek possible insurance options she can afford-  ?Pt reports good support from her family-  ?She is seeing her counselor today and is encouraged to discuss above as well  with her.  ?CSW will inquire with PCP and embedded Pharmacist and RNCM for options as well.  ? ?Depression screen [facility] 2/9 11/13/2021 07/30/2021 12/15/2020 01/09/2020 07/13/2019  ?Decreased Interest 3 3 0 3 3

## 2022-01-24 ENCOUNTER — Other Ambulatory Visit: Payer: Self-pay | Admitting: Nurse Practitioner

## 2022-01-24 DIAGNOSIS — E079 Disorder of thyroid, unspecified: Secondary | ICD-10-CM

## 2022-01-24 DIAGNOSIS — I10 Essential (primary) hypertension: Secondary | ICD-10-CM

## 2022-01-27 ENCOUNTER — Ambulatory Visit: Payer: Medicare Other | Admitting: Nurse Practitioner

## 2022-02-05 DIAGNOSIS — F32A Depression, unspecified: Secondary | ICD-10-CM

## 2022-04-13 ENCOUNTER — Emergency Department (HOSPITAL_BASED_OUTPATIENT_CLINIC_OR_DEPARTMENT_OTHER)
Admission: EM | Admit: 2022-04-13 | Discharge: 2022-04-13 | Disposition: A | Discharge status: Home / Self Care | Payer: No Typology Code available for payment source | Attending: Emergency Medicine | Admitting: Emergency Medicine

## 2022-04-13 ENCOUNTER — Encounter (HOSPITAL_BASED_OUTPATIENT_CLINIC_OR_DEPARTMENT_OTHER): Payer: Self-pay | Admitting: Emergency Medicine

## 2022-04-13 ENCOUNTER — Emergency Department (HOSPITAL_BASED_OUTPATIENT_CLINIC_OR_DEPARTMENT_OTHER): Payer: Self-pay

## 2022-04-13 ENCOUNTER — Other Ambulatory Visit: Payer: Self-pay

## 2022-04-13 DIAGNOSIS — S300XXA Contusion of lower back and pelvis, initial encounter: Secondary | ICD-10-CM | POA: Diagnosis not present

## 2022-04-13 DIAGNOSIS — S40012A Contusion of left shoulder, initial encounter: Secondary | ICD-10-CM

## 2022-04-13 DIAGNOSIS — W19XXXA Unspecified fall, initial encounter: Secondary | ICD-10-CM | POA: Insufficient documentation

## 2022-04-13 DIAGNOSIS — Z91040 Latex allergy status: Secondary | ICD-10-CM | POA: Diagnosis not present

## 2022-04-13 DIAGNOSIS — Z79899 Other long term (current) drug therapy: Secondary | ICD-10-CM | POA: Diagnosis not present

## 2022-04-13 DIAGNOSIS — E876 Hypokalemia: Secondary | ICD-10-CM

## 2022-04-13 DIAGNOSIS — R079 Chest pain, unspecified: Secondary | ICD-10-CM | POA: Diagnosis not present

## 2022-04-13 DIAGNOSIS — S20229A Contusion of unspecified back wall of thorax, initial encounter: Secondary | ICD-10-CM | POA: Insufficient documentation

## 2022-04-13 DIAGNOSIS — I10 Essential (primary) hypertension: Secondary | ICD-10-CM | POA: Insufficient documentation

## 2022-04-13 DIAGNOSIS — S3992XA Unspecified injury of lower back, initial encounter: Secondary | ICD-10-CM | POA: Diagnosis present

## 2022-04-13 DIAGNOSIS — E039 Hypothyroidism, unspecified: Secondary | ICD-10-CM | POA: Diagnosis not present

## 2022-04-13 LAB — CBC WITH DIFFERENTIAL/PLATELET
Abs Immature Granulocytes: 0.02 10*3/uL (ref 0.00–0.07)
Basophils Absolute: 0 10*3/uL (ref 0.0–0.1)
Basophils Relative: 0 %
Eosinophils Absolute: 0.1 10*3/uL (ref 0.0–0.5)
Eosinophils Relative: 1 %
HCT: 38 % (ref 36.0–46.0)
Hemoglobin: 12.7 g/dL (ref 12.0–15.0)
Immature Granulocytes: 0 %
Lymphocytes Relative: 35 %
Lymphs Abs: 3.2 10*3/uL (ref 0.7–4.0)
MCH: 28.9 pg (ref 26.0–34.0)
MCHC: 33.4 g/dL (ref 30.0–36.0)
MCV: 86.4 fL (ref 80.0–100.0)
Monocytes Absolute: 0.7 10*3/uL (ref 0.1–1.0)
Monocytes Relative: 8 %
Neutro Abs: 5.1 10*3/uL (ref 1.7–7.7)
Neutrophils Relative %: 56 %
Platelets: 255 10*3/uL (ref 150–400)
RBC: 4.4 MIL/uL (ref 3.87–5.11)
RDW: 14.9 % (ref 11.5–15.5)
WBC: 9.1 10*3/uL (ref 4.0–10.5)
nRBC: 0 % (ref 0.0–0.2)

## 2022-04-13 LAB — BASIC METABOLIC PANEL
Anion gap: 5 (ref 5–15)
BUN: 17 mg/dL (ref 6–20)
CO2: 27 mmol/L (ref 22–32)
Calcium: 9 mg/dL (ref 8.9–10.3)
Chloride: 107 mmol/L (ref 98–111)
Creatinine, Ser: 0.88 mg/dL (ref 0.44–1.00)
GFR, Estimated: >60 mL/min (ref >60)
Glucose, Bld: 119 mg/dL — ABNORMAL HIGH (ref 70–99)
Potassium: 3.4 mmol/L — ABNORMAL LOW (ref 3.5–5.1)
Sodium: 139 mmol/L (ref 135–145)

## 2022-04-13 LAB — TROPONIN I (HIGH SENSITIVITY): Troponin I (High Sensitivity): <2 ng/L (ref <18)

## 2022-04-13 MED ORDER — CYCLOBENZAPRINE HCL 10 MG PO TABS: 10.0000 mg | ORAL_TABLET | Freq: Two times a day (BID) | ORAL | 0 refills | Status: DC | PRN

## 2022-04-13 MED ORDER — KETOROLAC TROMETHAMINE 30 MG/ML IJ SOLN
30.0000 mg | Freq: Once | INTRAMUSCULAR | Status: AC
  Administered 2022-04-13: 30 mg via INTRAVENOUS
  Filled 2022-04-13: qty 1

## 2022-04-13 MED ORDER — ASPIRIN 81 MG PO CHEW
324.0000 mg | CHEWABLE_TABLET | Freq: Once | ORAL | Status: AC
  Administered 2022-04-13: 324 mg via ORAL
  Filled 2022-04-13: qty 4

## 2022-04-13 MED ORDER — POTASSIUM CHLORIDE CRYS ER 20 MEQ PO TBCR
40.0000 meq | EXTENDED_RELEASE_TABLET | Freq: Once | ORAL | Status: AC
  Administered 2022-04-13: 40 meq via ORAL
  Filled 2022-04-13: qty 2

## 2022-04-13 NOTE — Discharge Instructions (Signed)
For your pain, you may take up to '1000mg'$  of acetaminophen (tylenol) 4 times daily for up to a week. This is the maximum dose of acetminophen (tylenol) you can take from all sources. Please check other over-the-counter medications and prescriptions to ensure you are not taking other medications that contain acetaminophen.  You may also take ibuprofen 400 mg 6 times a day OR '600mg'$  4 times a day alternating with or at the same time as tylenol.  You may also use a lidocaine patch to help with pain in addition to the prescribed flexeril.

## 2022-04-13 NOTE — ED Notes (Signed)
Patient transported to radiology

## 2022-04-13 NOTE — ED Provider Notes (Signed)
  Physical Exam  BP (!) 132/91 (BP Location: Right Arm)   Pulse (!) 56   Temp 97.6 F (36.4 C) (Oral)   Resp 16   Ht '5\' 2"'$  (1.575 m)   Wt 109.8 kg   LMP 09/30/2017   SpO2 99%   BMI 44.26 kg/m   Physical Exam  Procedures  Procedures  ED Course / MDM    Medical Decision Making Amount and/or Complexity of Data Reviewed Labs: ordered. Radiology: ordered.  Risk OTC drugs. Prescription drug management.   Received care of patient from Dr. Roxanne Mins. Please see his note for prior hx physical and care.  Briefly, this is a 46yo female who had a fall now with back and chest pain.  Differential diagnosis for chest pain includes pulmonary embolus, dissection, pneumothorax, pneumonia, ACS, myocarditis, pericarditis.  EKG was done and evaluate by me and showed no acute ST changes and no signs of pericarditis. Chest x-ray was done and evaluated by me and radiology and showed no sign of pneumonia or pneumothorax. Have low clinical suspicion for PE. Troponin negative more than 6 hours after pain started and doubt ACS.  Do not feel history, pulses present bilaterally consistent with aortic dissection.   XR thoracic and lumbar spine negative. Discussed possibility of occult fx however at this time suspect more likely muscular pain and recommend PCP follow up .  No red flags of back pain. Patient discharged in stable condition with understanding of reasons to return.          Gareth Morgan, MD 04/14/22 [phone]

## 2022-04-13 NOTE — ED Triage Notes (Signed)
Pt states she fell Saturday at work  She was stepping off a trailer and her foot went between the trailer and the dock and she fell straight back  Pt is c/o pain from her tailbone to her shoulder blades  Pt states she started having some chest pain on Sunday

## 2022-04-13 NOTE — ED Provider Notes (Signed)
[facility] EMERGENCY DEPARTMENT Provider Note   CSN: [id number] Arrival date & time: 04/13/22  1025     History  Chief Complaint  Patient presents with   Back Pain    Janice Roberts is a 46 y.o. female.  The history is provided by the patient.  Back Pain She has history of hypertension, hypothyroidism and comes in after falling at work 3 days ago.  She states that she fell between the loading dock in a trailer and struck her upper back.  She is complaining of pain in both of her upper and lower back.  Yesterday, she started having chest pain as well.  Pain is described as a tight feeling across the anterior chest without radiation.  There is no associated dyspnea, nausea, diaphoresis.  She has been taking acetaminophen and ibuprofen which do give slight relief of her back pain.  She denies any weakness or numbness or tingling.  She denies history of diabetes, hyperlipidemia, tobacco use, family history of premature coronary atherosclerosis.   Home Medications Prior to Admission medications   Medication Sig Start Date End Date Taking? Authorizing Provider  albuterol (VENTOLIN HFA) 108 (90 Base) MCG/ACT inhaler Inhale 1-2 puffs into the lungs every 6 (six) hours as needed for wheezing or shortness of breath. 05/31/21   Jaynee Eagles, PA-C  amLODipine (NORVASC) 5 MG tablet Take 1 tablet by mouth once daily 01/26/22   Susy Frizzle, MD  baclofen (LIORESAL) 10 MG tablet Take 0.5-1 tablets (5-10 mg total) by mouth at bedtime as needed for muscle spasms. Patient not taking: Reported on 07/30/2021 01/27/21   Eulogio Bear, NP  benazepril (LOTENSIN) 20 MG tablet Take 1 tablet by mouth once daily 01/26/22   Susy Frizzle, MD  cetirizine (ZYRTEC ALLERGY) 10 MG tablet Take 1 tablet (10 mg total) by mouth daily. 05/31/21   Jaynee Eagles, PA-C  clonazePAM (KLONOPIN) 1 MG tablet Take 1 mg by mouth 2 (two) times daily.    [provider]  famotidine (PEPCID) 20 MG tablet Take 1  tablet (20 mg total) by mouth 2 (two) times daily. Patient not taking: Reported on 07/30/2021 01/27/21   Eulogio Bear, NP  fluticasone [facility]) 50 MCG/ACT nasal spray Place 1 spray into both nostrils 2 (two) times daily as needed for allergies or rhinitis. 12/15/20   Eulogio Bear, NP  ibuprofen (ADVIL) 800 MG tablet Take 1 tablet (800 mg total) by mouth 3 (three) times daily. 07/30/21   Eulogio Bear, NP  levothyroxine (SYNTHROID) 112 MCG tablet Take 1 tablet by mouth once daily 01/26/22   Susy Frizzle, MD  metFORMIN (GLUCOPHAGE) 500 MG tablet Take 1 tablet (500 mg total) by mouth daily with breakfast. 07/30/21   Eulogio Bear, NP  methocarbamol (ROBAXIN) 500 MG tablet Take 1 tablet (500 mg total) by mouth 2 (two) times daily. Patient not taking: Reported on 07/30/2021 10/20/20   Garald Balding, PA-C  Multiple Vitamins-Calcium (ONE-A-DAY WITHIN PO) Take 1 tablet by mouth daily.    [provider]  Tetrahydrozoline HCl (VISINE OP) Place 1 Drop/kg into both eyes daily as needed (dry eyes).    [provider]  SUMAtriptan (IMITREX) 100 MG tablet Take 1/2 to 1 full tablet at onset of headache Patient taking differently: Take 100 mg by mouth every 2 (two) hours as needed for migraine. 10/01/20 12/10/20  Alycia Rossetti, MD      Allergies    Ivp dye [iodinated contrast media],  Latex, Morphine and related, Oxycodone, Penicillins, and Percocet [oxycodone-acetaminophen]    Review of Systems   Review of Systems  Musculoskeletal:  Positive for back pain.  All other systems reviewed and are negative.  Physical Exam Updated Vital Signs BP (!) 138/99 (BP Location: Left Arm)   Pulse 64   Temp 98.4 F (36.9 C) (Oral)   Resp 16   Ht '5\' 2"'$  (1.575 m)   Wt 109.8 kg   LMP 09/30/2017   SpO2 97%   BMI 44.26 kg/m  Physical Exam Vitals and nursing note reviewed.  46 year old female, resting comfortably and in no acute distress. Vital signs are significant for  elevated blood pressure. Oxygen saturation is 97%, which is normal. Head is normocephalic and atraumatic. PERRLA, EOMI. Oropharynx is clear. Neck is nontender and supple without adenopathy or JVD. Back is moderately tender in the upper thoracic and lower lumbar area. Lungs are clear without rales, wheezes, or rhonchi. Chest is nontender. Heart has regular rate and rhythm without murmur. Abdomen is soft, flat, nontender. Extremities have no cyanosis or edema, full range of motion is present. Skin is warm and dry without rash. Neurologic: Mental status is normal, cranial nerves are intact, moves all extremities equally.  ED Results / Procedures / Treatments   Labs (all labs ordered are listed, but only abnormal results are displayed) Labs Reviewed  BASIC METABOLIC PANEL - Abnormal; Notable for the following components:      Result Value   Potassium 3.4 (*)    Glucose, Bld 119 (*)    All other components within normal limits  CBC WITH DIFFERENTIAL/PLATELET  TROPONIN I (HIGH SENSITIVITY)    EKG EKG Interpretation  Date/Time:  Tuesday April 13 2022 06:26:45 EDT Ventricular Rate:  71 PR Interval:  144 QRS Duration: 89 QT Interval:  404 QTC Calculation: 439 R Axis:   56 Text Interpretation: Sinus rhythm Low voltage, precordial leads When compared with ECG of 10/20/2020, No significant change was found Confirmed by Delora Fuel ([id number]) on 04/13/2022 6:29:19 AM  Radiology No results found.  Procedures Procedures    Medications Ordered in ED Medications  ketorolac (TORADOL) 30 MG/ML injection 30 mg (has no administration in time range)  aspirin chewable tablet 324 mg (has no administration in time range)    ED Course/ Medical Decision Making/ A&P                           Medical Decision Making Amount and/or Complexity of Data Reviewed Labs: ordered. Radiology: ordered.  Risk OTC drugs. Prescription drug management.   Fall with pain in her lower back.  Examination  is strongly suggestive of simple contusion, will send for x-rays to rule out fracture.  Chest pain may be related to her back and that she is trying to find positions that are comfortable and may be having some increased muscular tension in her anterior chest wall.  However, she does need to get ECG and serial troponins to rule out ACS.  No risk factors for pulmonary embolism, doubt pneumonia.  Old records are reviewed, and she does have prior ED visits for back pain and chest pain, but not recently.  I have independently reviewed and interpreted the ECG, and that does not show any acute ST or T wave changes.  Heart score before troponin results are back is 2, which puts her at low risk for major adverse cardiac events in the next 6 weeks.  I have  independently reviewed and interpreted the laboratory tests, initial troponin is normal, metabolic panel significant for borderline hypokalemia and she is given a dose of oral potassium.  CBC is normal.  X-rays are pending as is repeat troponin.  Case is signed out to Dr. Billy Fischer.  Final Clinical Impression(s) / ED Diagnoses Final diagnoses:  Fall, initial encounter  Contusion of upper back, initial encounter  Contusion of lower back, initial encounter  Nonspecific chest pain  Hypokalemia    Rx / DC Orders ED Discharge Orders     None         Delora Fuel, MD 22/56/72 [phone]

## 2022-04-15 ENCOUNTER — Ambulatory Visit (HOSPITAL_COMMUNITY)
Admission: EM | Admit: 2022-04-15 | Discharge: 2022-04-15 | Disposition: A | Discharge status: Home / Self Care | Payer: Self-pay | Attending: Student | Admitting: Student

## 2022-04-15 ENCOUNTER — Encounter (HOSPITAL_COMMUNITY): Payer: Self-pay

## 2022-04-15 DIAGNOSIS — S20219A Contusion of unspecified front wall of thorax, initial encounter: Secondary | ICD-10-CM

## 2022-04-15 MED ORDER — LIDOCAINE 5 % EX PTCH: 1.0000 | MEDICATED_PATCH | CUTANEOUS | 0 refills | Status: DC

## 2022-04-15 NOTE — Discharge Instructions (Addendum)
-  Lidocaine patch during the day. Avoid using at the same time as heating pad -Flexeril (cyclobenzaprine) to help with sleep at night  -You can take Tylenol up to 1000 mg 3 times daily, and ibuprofen up to 600 mg 3 times daily with food.  You can take these together, or alternate every 3-4 hours. -Heating pad

## 2022-04-15 NOTE — ED Triage Notes (Signed)
Pt states fell at work on Saturday and landing on metal bars. C/o bilateral rib pain and pain on breathing. Seen in ED and tx'd a few days ago and not feeling any better.

## 2022-04-15 NOTE — ED Provider Notes (Signed)
[facility]    CSN: [id number] Arrival date & time: 04/15/22  1133      History   Chief Complaint Chief Complaint  Patient presents with   Fall   Rib Injury    HPI Janice Roberts is a 46 y.o. female presenting for continued rib and back pain following fall that occurred 04/10/22. She continues to have discomfort.  She was evaluated in the ED for this 04/13/22, including imaging. Per their note - "comes in after falling at work 3 days ago.  She states that she fell between the loading dock in a trailer and struck her upper back.  She is complaining of pain in both of her upper and lower back.  Yesterday, she started having chest pain as well.  Pain is described as a tight feeling across the anterior chest without radiation.  There is no associated dyspnea, nausea, diaphoresis.  She has been taking acetaminophen and ibuprofen which do give slight relief of her back pain.  She denies any weakness or numbness or tingling.  She denies history of diabetes, hyperlipidemia, tobacco use, family history of premature coronary atherosclerosis...   Differential diagnosis for chest pain includes pulmonary embolus, dissection, pneumothorax, pneumonia, ACS, myocarditis, pericarditis.  EKG was done and evaluate by me and showed no acute ST changes and no signs of pericarditis. Chest x-ray was done and evaluated by me and radiology and showed no sign of pneumonia or pneumothorax. Have low clinical suspicion for PE. Troponin negative more than 6 hours after pain started and doubt ACS.  Do not feel history, pulses present bilaterally consistent with aortic dissection.    XR thoracic and lumbar spine negative. Discussed possibility of occult fx however at this time suspect more likely muscular pain and recommend PCP follow up .  No red flags of back pain. Patient discharged in stable condition with understanding of reasons to return. "   Flexeril was sent, which she has attempted without relief. She is  also trying tylenol/ibuprofen. States she needs another work note.     HPI  Past Medical History:  Diagnosis Date   Anxiety    no meds   Asthma    rarely uses inhaler - seasonal with allergies   Depression    no med   Hypertension    Hypothyroidism    Obesity    Seasonal allergies    SVD (spontaneous vaginal delivery) 1997   x    Thyroid disease     Patient Active Problem List   Diagnosis Date Noted   Chronic bilateral low back pain without sciatica 08/02/2021   Migraines 10/02/2020   Other insomnia 02/27/2020   Snoring 02/27/2020   Sicca syndrome, unspecified 01/30/2020   Mood disorder ([place]) 01/30/2020   Panic disorder 12/13/2019   Seasonal allergies 11/30/2019   Borderline diabetic 09/05/2019   PTSD (post-traumatic stress disorder) 07/13/2019   Endometriosis determined by laparoscopy 02/11/2017   PID (acute pelvic inflammatory disease) 11/17/2016   Pelvic pain 11/15/2016   Family history of breast cancer in mother 10/21/2016   Pelvic pain in female 11/10/2015   Anxiety    Depression    Asthma    Class 3 obesity ([place])    Hypertension    Thyroid disease     Past Surgical History:  Procedure Laterality Date   ABDOMINAL HYSTERECTOMY     [name]   x    DILATION AND CURETTAGE OF UTERUS  08/08/2008 & 03/08/2013  2009 missed ab, 2014 bleeding   LAPAROSCOPIC VAGINAL HYSTERECTOMY WITH SALPINGO OOPHORECTOMY Bilateral 12/06/2017   Procedure: LAPAROSCOPIC ASSISTED VAGINAL HYSTERECTOMY WITH SALPINGO OOPHORECTOMY;  Surgeon: Woodroe Mode, MD;  Location: [facility];  Service: Gynecology;  Laterality: Bilateral;   LAPAROSCOPY N/A 01/24/2017   Procedure: LAPAROSCOPY DIAGNOSTIC;  Surgeon: Woodroe Mode, MD;  Location: [facility];  Service: Gynecology;  Laterality: N/A;   SHOULDER SURGERY Left    THYROIDECTOMY     WISDOM TOOTH EXTRACTION      OB History     Gravida  10   Para  2   Term  2   Preterm  0   AB  8   Living  2      SAB   1   IAB  7   Ectopic      Multiple      Live Births  2            Home Medications    Prior to Admission medications   Medication Sig Start Date End Date Taking? Authorizing Provider  lidocaine (LIDODERM) 5 % Place 1 patch onto the skin daily. Remove & Discard patch within 12 hours or as directed by MD 04/15/22  Yes Hazel Sams, PA-C  albuterol (VENTOLIN HFA) 108 (90 Base) MCG/ACT inhaler Inhale 1-2 puffs into the lungs every 6 (six) hours as needed for wheezing or shortness of breath. 05/31/21   Jaynee Eagles, PA-C  amLODipine (NORVASC) 5 MG tablet Take 1 tablet by mouth once daily 01/26/22   Susy Frizzle, MD  benazepril (LOTENSIN) 20 MG tablet Take 1 tablet by mouth once daily 01/26/22   Susy Frizzle, MD  cetirizine (ZYRTEC ALLERGY) 10 MG tablet Take 1 tablet (10 mg total) by mouth daily. 05/31/21   Jaynee Eagles, PA-C  clonazePAM (KLONOPIN) 1 MG tablet Take 1 mg by mouth 2 (two) times daily.    [provider]  cyclobenzaprine (FLEXERIL) 10 MG tablet Take 1 tablet (10 mg total) by mouth 2 (two) times daily as needed for muscle spasms. 04/13/22   Gareth Morgan, MD  famotidine (PEPCID) 20 MG tablet Take 1 tablet (20 mg total) by mouth 2 (two) times daily. Patient not taking: Reported on 07/30/2021 01/27/21   Eulogio Bear, NP  fluticasone [facility]) 50 MCG/ACT nasal spray Place 1 spray into both nostrils 2 (two) times daily as needed for allergies or rhinitis. 12/15/20   Eulogio Bear, NP  ibuprofen (ADVIL) 800 MG tablet Take 1 tablet (800 mg total) by mouth 3 (three) times daily. 07/30/21   Eulogio Bear, NP  levothyroxine (SYNTHROID) 112 MCG tablet Take 1 tablet by mouth once daily 01/26/22   Susy Frizzle, MD  metFORMIN (GLUCOPHAGE) 500 MG tablet Take 1 tablet (500 mg total) by mouth daily with breakfast. 07/30/21   Eulogio Bear, NP  methocarbamol (ROBAXIN) 500 MG tablet Take 1 tablet (500 mg total) by mouth 2 (two) times daily. Patient not  taking: Reported on 07/30/2021 10/20/20   Garald Balding, PA-C  Multiple Vitamins-Calcium (ONE-A-DAY WITHIN PO) Take 1 tablet by mouth daily.    [provider]  Tetrahydrozoline HCl (VISINE OP) Place 1 Drop/kg into both eyes daily as needed (dry eyes).    [provider]  SUMAtriptan (IMITREX) 100 MG tablet Take 1/2 to 1 full tablet at onset of headache Patient taking differently: Take 100 mg by mouth every 2 (two) hours as needed for migraine. 10/01/20 12/10/20  Vic Blackbird  F, MD    Family History Family History  Problem Relation Age of Onset   Cancer Mother 100       Breast   Depression Mother    Hearing loss Mother    Hypertension Mother    Breast cancer Mother    Birth defects Sister    Asthma Brother     Social History Social History   Tobacco Use   Smoking status: Never   Smokeless tobacco: Never  Vaping Use   Vaping Use: Never used  Substance Use Topics   Alcohol use: No   Drug use: No     Allergies   Ivp dye [iodinated contrast media], Latex, Morphine and related, Oxycodone, Penicillins, and Percocet [oxycodone-acetaminophen]   Review of Systems Review of Systems  Musculoskeletal:  Positive for back pain.  All other systems reviewed and are negative.    Physical Exam Triage Vital Signs ED Triage Vitals  Enc Vitals Group     BP 04/15/22 1212 136/86     Pulse Rate 04/15/22 1212 70     Resp 04/15/22 1212 18     Temp 04/15/22 1212 99.3 F (37.4 C)     Temp Source 04/15/22 1212 Oral     SpO2 04/15/22 1212 96 %     Weight --      Height --      Head Circumference --      Peak Flow --      Pain Score 04/15/22 1213 8     Pain Loc --      Pain Edu? --      Excl. in [place]? --    No data found.  Updated Vital Signs BP 136/86 (BP Location: Left Arm)   Pulse 70   Temp 99.3 F (37.4 C) (Oral)   Resp 18   LMP 09/30/2017   SpO2 96%   Visual Acuity Right Eye Distance:   Left Eye Distance:   Bilateral Distance:    Right Eye  Near:   Left Eye Near:    Bilateral Near:     Physical Exam Vitals reviewed.  Constitutional:      General: She is not in acute distress.    Appearance: Normal appearance. She is not ill-appearing or diaphoretic.  HENT:     Head: Normocephalic and atraumatic.  Cardiovascular:     Rate and Rhythm: Normal rate and regular rhythm.     Heart sounds: Normal heart sounds.  Pulmonary:     Effort: Pulmonary effort is normal.     Breath sounds: Normal breath sounds.  Musculoskeletal:     Comments: Ribs - no bruising, swelling, or skin changes. TTP thoracic back and sides. No midline spinous tenderness, deformity, stepoff.   Skin:    General: Skin is warm.  Neurological:     General: No focal deficit present.     Mental Status: She is alert and oriented to person, place, and time.  Psychiatric:        Mood and Affect: Mood normal.        Behavior: Behavior normal.        Thought Content: Thought content normal.        Judgment: Judgment normal.      UC Treatments / Results  Labs (all labs ordered are listed, but only abnormal results are displayed) Labs Reviewed - No data to display  EKG   Radiology No results found.  Procedures Procedures (including critical care time)  Medications Ordered in UC Medications -  No data to display  Initial Impression / Assessment and Plan / UC Course  I have reviewed the triage vital signs and the nursing notes.  Pertinent labs & imaging results that were available during my care of the patient were reviewed by me and considered in my medical decision making (see chart for details).     This patient is a very pleasant 46 y.o. year old female presenting with continued pain from rib contusion that occurred on 04/10/22. She has already been fully evaluated in the ED including imaging.  She is primarily here for a work note today.  Note provided for 3 more days.  Also sent lidocaine patches.  Continue Flexeril, Tylenol/ibuprofen..   Final  Clinical Impressions(s) / UC Diagnoses   Final diagnoses:  Contusion of rib, unspecified laterality, initial encounter     Discharge Instructions      -Lidocaine patch during the day. Avoid using at the same time as heating pad -Flexeril (cyclobenzaprine) to help with sleep at night  -You can take Tylenol up to 1000 mg 3 times daily, and ibuprofen up to 600 mg 3 times daily with food.  You can take these together, or alternate every 3-4 hours. -Heating pad      ED Prescriptions     Medication Sig Dispense Auth. Provider   lidocaine (LIDODERM) 5 % Place 1 patch onto the skin daily. Remove & Discard patch within 12 hours or as directed by MD 20 patch Hazel Sams, PA-C      PDMP not reviewed this encounter.   Hazel Sams, PA-C 04/15/22 1250

## 2022-04-20 ENCOUNTER — Other Ambulatory Visit: Payer: Self-pay | Admitting: Family Medicine

## 2022-04-20 DIAGNOSIS — E079 Disorder of thyroid, unspecified: Secondary | ICD-10-CM

## 2022-04-20 NOTE — Telephone Encounter (Signed)
pre scriber not at this practice  Requested Prescriptions  Refused Prescriptions Disp Refills  . levothyroxine (SYNTHROID) 112 MCG tablet [Pharmacy Med Name: Levothyroxine Sodium 112 MCG Oral Tablet] 90 tablet 0    Sig: Take 1 tablet by mouth once daily     There is no refill protocol information for this order

## 2022-04-24 ENCOUNTER — Emergency Department (HOSPITAL_COMMUNITY)
Admission: EM | Admit: 2022-04-24 | Discharge: 2022-04-24 | Disposition: A | Discharge status: Home / Self Care | Payer: 59 | Attending: Emergency Medicine | Admitting: Emergency Medicine

## 2022-04-24 ENCOUNTER — Emergency Department (HOSPITAL_COMMUNITY): Payer: 59

## 2022-04-24 ENCOUNTER — Encounter (HOSPITAL_COMMUNITY): Payer: Self-pay | Admitting: Emergency Medicine

## 2022-04-24 DIAGNOSIS — Z79899 Other long term (current) drug therapy: Secondary | ICD-10-CM | POA: Diagnosis not present

## 2022-04-24 DIAGNOSIS — Z91040 Latex allergy status: Secondary | ICD-10-CM | POA: Insufficient documentation

## 2022-04-24 DIAGNOSIS — M7601 Gluteal tendinitis, right hip: Secondary | ICD-10-CM | POA: Diagnosis not present

## 2022-04-24 DIAGNOSIS — M542 Cervicalgia: Secondary | ICD-10-CM | POA: Insufficient documentation

## 2022-04-24 DIAGNOSIS — M545 Low back pain, unspecified: Secondary | ICD-10-CM | POA: Diagnosis not present

## 2022-04-24 DIAGNOSIS — E119 Type 2 diabetes mellitus without complications: Secondary | ICD-10-CM | POA: Insufficient documentation

## 2022-04-24 DIAGNOSIS — I10 Essential (primary) hypertension: Secondary | ICD-10-CM | POA: Diagnosis not present

## 2022-04-24 DIAGNOSIS — M7602 Gluteal tendinitis, left hip: Secondary | ICD-10-CM | POA: Insufficient documentation

## 2022-04-24 DIAGNOSIS — Y92410 Unspecified street and highway as the place of occurrence of the external cause: Secondary | ICD-10-CM | POA: Diagnosis not present

## 2022-04-24 DIAGNOSIS — Z7984 Long term (current) use of oral hypoglycemic drugs: Secondary | ICD-10-CM | POA: Diagnosis not present

## 2022-04-24 MED ORDER — LIDOCAINE 5 % EX PTCH
1.0000 | MEDICATED_PATCH | CUTANEOUS | Status: DC
  Administered 2022-04-24: 1 via TRANSDERMAL
  Filled 2022-04-24: qty 1

## 2022-04-24 MED ORDER — KETOROLAC TROMETHAMINE 15 MG/ML IJ SOLN
15.0000 mg | Freq: Once | INTRAMUSCULAR | Status: AC
  Administered 2022-04-24: 15 mg via INTRAMUSCULAR
  Filled 2022-04-24: qty 1
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
## 2022-04-24 NOTE — ED Notes (Signed)
Patient transported to X-ray 

## 2022-04-24 NOTE — Discharge Instructions (Addendum)
Your xrays were normal and did not show any fractures. It is normal to have soreness for the next 2-3 days. You may use tylenol and ibuprofen as needed for your pain. If you develop any worsening pain, numbness, or weakness in your arms or legs, please return to the emergency department. We hope you feel better soon.

## 2022-04-24 NOTE — ED Notes (Signed)
Pt ambulated to restroom and back.
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
## 2022-06-05 ENCOUNTER — Other Ambulatory Visit: Payer: Self-pay | Admitting: Family Medicine

## 2022-06-05 DIAGNOSIS — E079 Disorder of thyroid, unspecified: Secondary | ICD-10-CM

## 2022-06-07 NOTE — Telephone Encounter (Signed)
Courtesy refill given by Dr. Dennard Schaumann 01/26/2022 #90 0 refills. Requested Prescriptions  Pending Prescriptions Disp Refills  . levothyroxine (SYNTHROID) 112 MCG tablet [Pharmacy Med Name: Levothyroxine Sodium 112 MCG Oral Tablet] 90 tablet 0    Sig: Take 1 tablet by mouth once daily     Endocrinology:  Hypothyroid Agents Passed - 06/05/2022  9:05 AM      Passed - TSH in normal range and within 360 days    TSH  Date Value Ref Range Status  07/30/2021 3.08 mIU/L Final    Comment:              Reference Range .           > or = 20 Years  0.40-4.50 .                Pregnancy Ranges           First trimester    0.26-2.66           Second trimester   0.55-2.73           Third trimester    0.43-2.91          Passed - Valid encounter within last 12 months    Recent Outpatient Visits          7 months ago Mood disorder ([place])   [place] Eulogio Bear, NP   10 months ago Mood disorder [facility])   [place] Eulogio Bear, NP   1 year ago Acute vaginitis   [place] Eulogio Bear, NP   1 year ago Acute non-recurrent frontal sinusitis   [place] Eulogio Bear, NP   1 year ago BV (bacterial vaginosis)   [facility] [place], [place] Nunnery, MD

## 2022-06-09 ENCOUNTER — Other Ambulatory Visit: Payer: Self-pay | Admitting: Chiropractic Medicine

## 2022-06-09 DIAGNOSIS — M545 Low back pain, unspecified: Secondary | ICD-10-CM

## 2022-06-20 ENCOUNTER — Ambulatory Visit
Admission: RE | Admit: 2022-06-20 | Discharge: 2022-06-20 | Disposition: A | Discharge status: Home / Self Care | Payer: No Typology Code available for payment source | Source: Ambulatory Visit | Attending: Chiropractic Medicine | Admitting: Chiropractic Medicine

## 2022-06-20 DIAGNOSIS — M545 Low back pain, unspecified: Secondary | ICD-10-CM

## 2022-11-06 ENCOUNTER — Encounter (HOSPITAL_COMMUNITY): Payer: Self-pay

## 2022-11-06 ENCOUNTER — Ambulatory Visit (HOSPITAL_COMMUNITY)
Admission: EM | Admit: 2022-11-06 | Discharge: 2022-11-06 | Disposition: A | Discharge status: Home / Self Care | Payer: No Typology Code available for payment source | Attending: Emergency Medicine | Admitting: Emergency Medicine

## 2022-11-06 DIAGNOSIS — B349 Viral infection, unspecified: Secondary | ICD-10-CM

## 2022-11-06 DIAGNOSIS — R491 Aphonia: Secondary | ICD-10-CM

## 2022-11-06 MED ORDER — PREDNISONE 20 MG PO TABS: 40.0000 mg | ORAL_TABLET | Freq: Every day | ORAL | 0 refills | Status: DC

## 2022-11-06 NOTE — Discharge Instructions (Addendum)
Prednisone is a steroid, this medication has been sent to the pharmacy, you will take 2 tablets for the next 6 mornings.  As discussed, make sure to continue drinking plenty of fluids and rest your voice during this time to allow healing.   Please follow-up if your symptoms are not improving.

## 2022-11-06 NOTE — ED Triage Notes (Signed)
Pt is here for cough, runny nose x 5days

## 2022-11-06 NOTE — ED Provider Notes (Addendum)
[facility]    CSN: [id number] Arrival date & time: 11/06/22  1019      History   Chief Complaint Chief Complaint  Patient presents with   Cough    HPI Janice Roberts is a 46 y.o. female.  Patient presents complaining of loss of voice that started 6 days ago.  Patient states that this has progressively worsened.  She states that the onset of her symptoms began with cough and rhinorrhea which has improved.  She states that she feels like she caught a viral illness that she was exposed to through her grandchildren.  She reports that she is mainly feeling better and well pertaining to energy.  She states that she has lost her voice with previous illnesses, this usually happens yearly.  Patient states that she is tried numerous over-the-counter measures and warm fluids with no relief of the voice.  She denies any sore throat or any other current cold symptoms. History of prediabetes, currently diet managed per patient statement.  Cough   Past Medical History:  Diagnosis Date   Anxiety    no meds   Asthma    rarely uses inhaler - seasonal with allergies   Depression    no med   Hypertension    Hypothyroidism    Obesity    Seasonal allergies    SVD (spontaneous vaginal delivery) 1997   x    Thyroid disease     Patient Active Problem List   Diagnosis Date Noted   Chronic bilateral low back pain without sciatica 08/02/2021   Migraines 10/02/2020   Other insomnia 02/27/2020   Snoring 02/27/2020   Sicca syndrome, unspecified 01/30/2020   Mood disorder ([place]) 01/30/2020   Panic disorder 12/13/2019   Seasonal allergies 11/30/2019   Borderline diabetic 09/05/2019   PTSD (post-traumatic stress disorder) 07/13/2019   Endometriosis determined by laparoscopy 02/11/2017   PID (acute pelvic inflammatory disease) 11/17/2016   Pelvic pain 11/15/2016   Family history of breast cancer in mother 10/21/2016   Pelvic pain in female 11/10/2015   Anxiety    Depression     Asthma    Class 3 obesity ([place])    Hypertension    Thyroid disease     Past Surgical History:  Procedure Laterality Date   ABDOMINAL HYSTERECTOMY     [place]   x    [place] OF UTERUS  08/08/2008 & 03/08/2013   2009 missed ab, 2014 bleeding   LAPAROSCOPIC VAGINAL HYSTERECTOMY WITH SALPINGO OOPHORECTOMY Bilateral 12/06/2017   Procedure: LAPAROSCOPIC ASSISTED VAGINAL HYSTERECTOMY WITH SALPINGO OOPHORECTOMY;  Surgeon: Woodroe Mode, MD;  Location: [facility];  Service: Gynecology;  Laterality: Bilateral;   LAPAROSCOPY N/A 01/24/2017   Procedure: LAPAROSCOPY DIAGNOSTIC;  Surgeon: Woodroe Mode, MD;  Location: [facility];  Service: Gynecology;  Laterality: N/A;   SHOULDER SURGERY Left    THYROIDECTOMY     WISDOM TOOTH EXTRACTION      OB History     Gravida  10   Para  2   Term  2   Preterm  0   AB  8   Living  2      SAB  1   IAB  7   Ectopic      Multiple      Live Births  2            Home Medications    Prior to Admission medications  Medication Sig Start Date End Date Taking? Authorizing Provider  predniSONE (DELTASONE) 20 MG tablet Take 2 tablets (40 mg total) by mouth daily. 11/06/22  Yes Flossie Dibble, NP  albuterol (VENTOLIN HFA) 108 (90 Base) MCG/ACT inhaler Inhale 1-2 puffs into the lungs every 6 (six) hours as needed for wheezing or shortness of breath. 05/31/21   Jaynee Eagles, PA-C  amLODipine (NORVASC) 5 MG tablet Take 1 tablet by mouth once daily 01/26/22   Susy Frizzle, MD  benazepril (LOTENSIN) 20 MG tablet Take 1 tablet by mouth once daily 01/26/22   Susy Frizzle, MD  cetirizine (ZYRTEC ALLERGY) 10 MG tablet Take 1 tablet (10 mg total) by mouth daily. 05/31/21   Jaynee Eagles, PA-C  clonazePAM (KLONOPIN) 1 MG tablet Take 1 mg by mouth 2 (two) times daily.    [provider]  cyclobenzaprine (FLEXERIL) 10 MG tablet Take 1 tablet (10 mg total) by mouth 2 (two) times daily as needed for  muscle spasms. 04/13/22   Gareth Morgan, MD  fluticasone (FLONASE) 50 MCG/ACT nasal spray Place 1 spray into both nostrils 2 (two) times daily as needed for allergies or rhinitis. 12/15/20   Eulogio Bear, NP  ibuprofen (ADVIL) 800 MG tablet Take 1 tablet (800 mg total) by mouth 3 (three) times daily. 07/30/21   Eulogio Bear, NP  levothyroxine (SYNTHROID) 112 MCG tablet Take 1 tablet by mouth once daily 01/26/22   Susy Frizzle, MD  lidocaine (LIDODERM) 5 % Place 1 patch onto the skin daily. Remove & Discard patch within 12 hours or as directed by MD 04/15/22   Hazel Sams, PA-C  metFORMIN (GLUCOPHAGE) 500 MG tablet Take 1 tablet (500 mg total) by mouth daily with breakfast. 07/30/21   Eulogio Bear, NP  Multiple Vitamins-Calcium (ONE-A-DAY WITHIN PO) Take 1 tablet by mouth daily.    [provider]  Tetrahydrozoline HCl (VISINE OP) Place 1 Drop/kg into both eyes daily as needed (dry eyes).    [provider]  SUMAtriptan (IMITREX) 100 MG tablet Take 1/2 to 1 full tablet at onset of headache Patient taking differently: Take 100 mg by mouth every 2 (two) hours as needed for migraine. 10/01/20 12/10/20  Alycia Rossetti, MD    Family History Family History  Problem Relation Age of Onset   Cancer Mother 40       Breast   Depression Mother    Hearing loss Mother    Hypertension Mother    Breast cancer Mother    Birth defects Sister    Asthma Brother     Social History Social History   Tobacco Use   Smoking status: Never   Smokeless tobacco: Never  Vaping Use   Vaping Use: Never used  Substance Use Topics   Alcohol use: No   Drug use: No     Allergies   Ivp dye [iodinated contrast media], Latex, Morphine and related, Oxycodone, Penicillins, and Percocet [oxycodone-acetaminophen]   Review of Systems Review of Systems Per HPI  Physical Exam Triage Vital Signs ED Triage Vitals [11/06/22 1231]  Enc Vitals Group     BP 128/87     Pulse  Rate 66     Resp 16     Temp 98 F (36.7 C)     Temp Source Oral     SpO2 97 %     Weight      Height      Head Circumference  Peak Flow      Pain Score 0     Pain Loc      Pain Edu?      Excl. in [place]?    No data found.  Updated Vital Signs BP 128/87 (BP Location: Left Wrist)   Pulse 66   Temp 98 F (36.7 C) (Oral)   Resp 16   LMP 09/30/2017   SpO2 97%      Physical Exam Vitals and nursing note reviewed.  Constitutional:      Appearance: Normal appearance.  HENT:     Right Ear: Hearing, tympanic membrane, ear canal and external ear normal.     Left Ear: Hearing, tympanic membrane, ear canal and external ear normal.     Nose: Rhinorrhea present. No congestion. Rhinorrhea is clear.     Right Turbinates: Not enlarged, swollen or pale.     Left Turbinates: Not enlarged, swollen or pale.     Right Sinus: No maxillary sinus tenderness or frontal sinus tenderness.     Left Sinus: No maxillary sinus tenderness or frontal sinus tenderness.     Mouth/Throat:     Mouth: Mucous membranes are moist.     Pharynx: Uvula midline. No pharyngeal swelling, oropharyngeal exudate, posterior oropharyngeal erythema or uvula swelling.     Tonsils: No tonsillar exudate or tonsillar abscesses. 0 on the right. 0 on the left.     Comments: Hoarse voice noted during exam  Cardiovascular:     Rate and Rhythm: Normal rate and regular rhythm.     Heart sounds: Normal heart sounds, S1 normal and S2 normal.  Pulmonary:     Effort: Pulmonary effort is normal.     Breath sounds: Normal breath sounds and air entry. No decreased breath sounds, wheezing, rhonchi or rales.  Lymphadenopathy:     Cervical: No cervical adenopathy.  Neurological:     Mental Status: She is alert.     UC Treatments / Results  Labs (all labs ordered are listed, but only abnormal results are displayed) Labs Reviewed - No data to display  EKG   Radiology No results found.  Procedures Procedures (including  critical care time)  Medications Ordered in UC Medications - No data to display  Initial Impression / Assessment and Plan / UC Course  I have reviewed the triage vital signs and the nursing notes.  Pertinent labs & imaging results that were available during my care of the patient were reviewed by me and considered in my medical decision making (see chart for details).   Patient was evaluated for loss of voice.  Loss of voice seems to have been caused by an initial viral etiology.  Patient has a history of prediabetes, patient states that she is diet controlled and she is currently not taking any metformin until following up with her PCP.  Last A1c was on 07/30/2021 of 6%.  Glucocorticoid therapy appears reasonable at this time.  Patient was made aware of a potential side effect of steroids, she was made aware of signs and symptoms of hyperglycemia.  6-day course of prednisone was sent to the pharmacy to help with vocal cords.  Patient was made aware of timeline for symptom resolution and when follow-up would be necessary.  Patient verbalized understanding of instructions.  Charting was provided using a a verbal dictation system, charting was proofread for errors, errors may occur which could change the meaning of the information charted.   Final Clinical Impressions(s) / UC Diagnoses   Final diagnoses:  Viral illness  Loss of voice     Discharge Instructions      Prednisone is a steroid, this medication has been sent to the pharmacy, you will take 2 tablets for the next 6 mornings.  As discussed, make sure to continue drinking plenty of fluids and rest your voice during this time to allow healing.   Please follow-up if your symptoms are not improving.      ED Prescriptions     Medication Sig Dispense Auth. Provider   predniSONE (DELTASONE) 20 MG tablet Take 2 tablets (40 mg total) by mouth daily. 12 tablet Flossie Dibble, NP      PDMP not reviewed this encounter.    Flossie Dibble, NP 11/06/22 1420    Flossie Dibble, NP 11/06/22 1422

## 2023-01-21 ENCOUNTER — Ambulatory Visit (INDEPENDENT_AMBULATORY_CARE_PROVIDER_SITE_OTHER): Payer: Managed Care, Other (non HMO) | Admitting: Sports Medicine

## 2023-01-21 ENCOUNTER — Other Ambulatory Visit (INDEPENDENT_AMBULATORY_CARE_PROVIDER_SITE_OTHER): Payer: Managed Care, Other (non HMO)

## 2023-01-21 ENCOUNTER — Encounter: Payer: Self-pay | Admitting: Sports Medicine

## 2023-01-21 VITALS — BP 115/77 | HR 60

## 2023-01-21 DIAGNOSIS — G8929 Other chronic pain: Secondary | ICD-10-CM

## 2023-01-21 DIAGNOSIS — M25562 Pain in left knee: Secondary | ICD-10-CM

## 2023-01-21 DIAGNOSIS — M170 Bilateral primary osteoarthritis of knee: Secondary | ICD-10-CM | POA: Diagnosis not present

## 2023-01-21 DIAGNOSIS — M25561 Pain in right knee: Secondary | ICD-10-CM

## 2023-01-21 DIAGNOSIS — S83207A Unspecified tear of unspecified meniscus, current injury, left knee, initial encounter: Secondary | ICD-10-CM

## 2023-01-21 MED ORDER — BUPIVACAINE HCL 0.25 % IJ SOLN
2.0000 mL | INTRAMUSCULAR | Status: AC | PRN
  Administered 2023-01-21: 2 mL via INTRA_ARTICULAR

## 2023-01-21 MED ORDER — METHYLPREDNISOLONE ACETATE 40 MG/ML IJ SUSP
80.0000 mg | INTRAMUSCULAR | Status: AC | PRN
  Administered 2023-01-21: 80 mg via INTRA_ARTICULAR

## 2023-01-21 MED ORDER — LIDOCAINE HCL 1 % IJ SOLN
2.0000 mL | INTRAMUSCULAR | Status: AC | PRN
  Administered 2023-01-21: 2 mL

## 2023-01-21 NOTE — Progress Notes (Signed)
Pain for almost 1 year. Fell at work last year but had pain before. Fall just made it worse.tried creams, ibu and tylenol. No injections or PT yet. Says is prediabetic

## 2023-01-21 NOTE — Progress Notes (Signed)
Janice Roberts - 47 y.o. female MRN [id number]  Date of birth: [birth year]  Office Visit Note: Visit Date: 01/21/2023 PCP: Mckinley Jewel, MD Referred by: Mckinley Jewel, MD  Subjective: Chief Complaint  Patient presents with   Left Knee - Pain   Right Knee - Pain   HPI: Janice Roberts is a very pleasant 48 y.o. female who presents today for bilateral chronic knee pain.  She has had bilateral knee pain for about a year or so.  Pain started insidiously, although she had a fall where the knee was stuck and she fell backward.  This did exacerbate her pain.  The left knee has always been worse than the right.  Pain is worse on the medial joint line.  She does have crepitus of bilateral knees, but the left knee does have some locking and catching.  At times he feels like the left knee will give out on her.  She denies noticing any gross swelling.  For treatment she is taking Tylenol, ibuprofen, IcyHot as well as topical Aspercreme.  She has not undergone any sort of physical therapy.   Pertinent ROS were reviewed with the patient and found to be negative unless otherwise specified above in HPI.   Assessment & Plan: Visit Diagnoses:  1. Bilateral primary osteoarthritis of knee   2. Chronic pain of left knee   3. Chronic pain of right knee   4. Positive McMurray test of left knee, initial encounter    Plan: Discussed with Ivelis today the nature of her bilateral knee pain.  She does have some osteoarthritic change more so on the medial compartment and patellofemoral compartments, left greater than right.  I do think she has had an exacerbation of his chronic knee pain.  The left knee also has some signs and symptoms indicative of possible meniscal tearing with locking and catching.  She does not have a significant effusion however. We discussed all treatment therapies such as oral medications, formal versus home therapy, injection therapy, need for advanced imaging.  She wanted to proceed  with home rehab therapy, I did give her a printed customized handout for knee strengthening stabilization exercises, she will perform this once daily.  Through shared decision-making, did proceed with corticosteroid injection into the left knee.  She will continue her over-the-counter Tylenol and ibuprofen, may use ice for any postinjection pain.  I would like to see her back in about 4 weeks to see how both of the knees are doing from the left knee injection and the therapy.   If she continues with mechanical symptoms and does not get good relief from the injection of the left knee, we may need to obtain an MRI to evaluate for meniscal or other internal pathology at later dates, will hold for now.  Follow-up: Return in about 4 weeks (around 02/18/2023) for for bilateral knees.   Meds & Orders: No orders of the defined types were placed in this encounter.   Orders Placed This Encounter  Procedures   Large Joint Inj   XR Knee Complete 4 Views Left   XR Knee Complete 4 Views Right     Procedures: Large Joint Inj: L knee on 01/21/2023 11:08 AM Details: 22 G 1.5 in needle, anterolateral approach Medications: 2 mL lidocaine 1 %; 2 mL bupivacaine 0.25 %; 80 mg methylPREDNISolone acetate 40 MG/ML Outcome: tolerated well, no immediate complications  Knee Injection, Left: After discussion on risks/benefits/indications, informed verbal consent was obtained and a timeout  was performed, patient was seated on exam table. The patient's knee was prepped with Betadine and alcohol swabs and utilizing anteromedial approach, the patient's knee was injected intraarticularly with 2:2:2 lidocaine 1%:bupivicaine 0.25%:depomedrol. Patient tolerated the procedure well without immediate complications.  Procedure, treatment alternatives, risks and benefits explained, specific risks discussed. Consent was given by the patient. Immediately prior to procedure a time out was called to verify the correct patient, procedure,  equipment, support staff and site/side marked as required. Patient was prepped and draped in the usual sterile fashion.          Clinical History: No specialty comments available.  She reports that she has never smoked. She has never used smokeless tobacco. No results for input(s): "HGBA1C", "LABURIC" in the last 8760 hours.  Objective:   Vital Signs: BP 115/77   Pulse 60   LMP 09/30/2017   Physical Exam  Gen: Well-appearing, in no acute distress; non-toxic CV:  Well-perfused. Warm.  Resp: Breathing unlabored on room air; no wheezing. Psych: Fluid speech in conversation; appropriate affect; normal thought process Neuro: Sensation intact throughout. No gross coordination deficits.   Ortho Exam - Bilateral knees: Evaluation of bilateral knees shows no redness, swelling or significant effusion.  There is rather significant positive TTP on the medial joint line of the left knee.  There is bilateral patellar crepitus.  Range of motion is intact both active and passively 0-135 degrees bilaterally, there is some mild pain with endrange flexion of the left knee.  There is no varus or valgus instability.  There is positive pain with McMurray's testing on the left knee.  Ligamentously intact.  Strength 5/5 throughout. NVI.  Imaging: XR Knee Complete 4 Views Right  Result Date: 01/21/2023 4 views of bilateral knees including standing AP, Rosenberg, lateral and sunrise views were ordered and reviewed by myself.  X-rays demonstrate moderate medial joint space narrowing of the left knee, mild medial joint space narrowing of the right knee.  There are some spurring off the left knee medial femoral condyle medial tibial plateau.  There is moderate patellofemoral arthritic change of the left knee.  No acute fracture or bony abnormality noted.  XR Knee Complete 4 Views Left  Result Date: 01/21/2023 4 views of bilateral knees including standing AP, Rosenberg, lateral and sunrise views were ordered and  reviewed by myself.  X-rays demonstrate moderate medial joint space narrowing of the left knee, mild medial joint space narrowing of the right knee.  There are some spurring off the left knee medial femoral condyle medial tibial plateau.  There is moderate patellofemoral arthritic change of the left knee.  No acute fracture or bony abnormality noted.   *Independent review of 4 view x-ray from 09/14/2019 was independently reviewed and interpreted by myself.  These do appear to be nonweightbearing films, there is some medial joint line narrowing and patellofemoral change, although above radiographs from today do show some progression of her medial osteoarthritic change. Narrative & Impression  CLINICAL DATA:  Left knee pain   EXAM: LEFT KNEE - COMPLETE 4+ VIEW   COMPARISON:  None.   FINDINGS: No fracture or dislocation is seen.   The joint spaces are preserved.   Visualized soft tissues are within normal limits.   No suprapatellar knee joint effusion.   IMPRESSION: Negative.     Electronically Signed   By: Julian Hy M.D.   On: 09/15/2019 17:13    Past Medical/Family/Surgical/Social History: Medications & Allergies reviewed per EMR, new medications updated. Patient Active  Problem List   Diagnosis Date Noted   Chronic bilateral low back pain without sciatica 08/02/2021   Migraines 10/02/2020   Other insomnia 02/27/2020   Snoring 02/27/2020   Sicca syndrome, unspecified 01/30/2020   Mood disorder ([place]) 01/30/2020   Panic disorder 12/13/2019   Seasonal allergies 11/30/2019   Borderline diabetic 09/05/2019   PTSD (post-traumatic stress disorder) 07/13/2019   Endometriosis determined by laparoscopy 02/11/2017   PID (acute pelvic inflammatory disease) 11/17/2016   Pelvic pain 11/15/2016   Family history of breast cancer in mother 10/21/2016   Pelvic pain in female 11/10/2015   Anxiety    Depression    Asthma    Class 3 obesity ([place])    Hypertension    Thyroid  disease    Past Medical History:  Diagnosis Date   Anxiety    no meds   Asthma    rarely uses inhaler - seasonal with allergies   Depression    no med   Hypertension    Hypothyroidism    Obesity    Seasonal allergies    SVD (spontaneous vaginal delivery) 1997   x    Thyroid disease    Family History  Problem Relation Age of Onset   Cancer Mother 76       Breast   Depression Mother    Hearing loss Mother    Hypertension Mother    Breast cancer Mother    Birth defects Sister    Asthma Brother    Past Surgical History:  Procedure Laterality Date   ABDOMINAL HYSTERECTOMY     [place]   x    [place] OF UTERUS  08/08/2008 & 03/08/2013   2009 missed ab, 2014 bleeding   LAPAROSCOPIC VAGINAL HYSTERECTOMY WITH SALPINGO OOPHORECTOMY Bilateral 12/06/2017   Procedure: LAPAROSCOPIC ASSISTED VAGINAL HYSTERECTOMY WITH SALPINGO OOPHORECTOMY;  Surgeon: Woodroe Mode, MD;  Location: [facility];  Service: Gynecology;  Laterality: Bilateral;   LAPAROSCOPY N/A 01/24/2017   Procedure: LAPAROSCOPY DIAGNOSTIC;  Surgeon: Woodroe Mode, MD;  Location: [facility];  Service: Gynecology;  Laterality: N/A;   SHOULDER SURGERY Left    THYROIDECTOMY     WISDOM TOOTH EXTRACTION     Social History   Occupational History   Not on file  Tobacco Use   Smoking status: Never   Smokeless tobacco: Never  Vaping Use   Vaping Use: Never used  Substance and Sexual Activity   Alcohol use: No   Drug use: No   Sexual activity: Not Currently    Birth control/protection: None

## 2023-02-01 ENCOUNTER — Ambulatory Visit
Admission: RE | Admit: 2023-02-01 | Discharge: 2023-02-01 | Disposition: A | Discharge status: Home / Self Care | Payer: Managed Care, Other (non HMO) | Source: Ambulatory Visit | Attending: Internal Medicine

## 2023-02-01 ENCOUNTER — Other Ambulatory Visit: Payer: Self-pay

## 2023-02-01 ENCOUNTER — Encounter: Payer: Self-pay | Admitting: Internal Medicine

## 2023-02-01 ENCOUNTER — Ambulatory Visit (INDEPENDENT_AMBULATORY_CARE_PROVIDER_SITE_OTHER): Payer: Managed Care, Other (non HMO) | Admitting: Internal Medicine

## 2023-02-01 VITALS — BP 114/79 | HR 55 | Temp 97.9°F | Ht 62.0 in | Wt 252.0 lb

## 2023-02-01 DIAGNOSIS — Z227 Latent tuberculosis: Secondary | ICD-10-CM

## 2023-02-01 NOTE — Assessment & Plan Note (Signed)
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  She has no TB risk factors and no pulmonary symptoms.  Discussed and recommended treatment for LTBI and she is agreeable.  Will get CXR and labs today for baseline. Will also screen for HIV to be complete.  Follow up in 6 weeks and plan for likely Rifampin x 4 months for treatment.  Would be okay with her starting DMARD treatment once she has completed about 1 month of LTBI treatment.

## 2023-02-01 NOTE — Progress Notes (Signed)
[facility]  Reason for Consult: "Hand and joint pain"  Referring Provider: Dr Kathlene November (Rheumatology)   HPI:    Janice Roberts is a 47 y.o. female with PMHx as below who presents to the clinic for hand and joint pain.   Patient was seen earlier this month by her rheumatologist for evaluation of hand pain, stiffness, bilateral knee pain.  Lab work from February 2024.  She was suspected to have inflammatory arthritis.  She had screening labs as part of her workup to consider immune modulating therapy.  Her lab work was notable for hepatitis B core antibody nonreactive, surface antibody nonreactive, hepatitis C antibody nonreactive.  Her CBC was unremarkable, CMP with normal LFTs and normal creatinine.  Her QuantiFERON gold was positive thus prompting what I assume was the reason for her referral.  There is no prior HIV testing that I see. There is no recent CXR but in June 2023 was normal.  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  No history of working in healthcare, no IVDU, no incarceration, no exposure to TB, no pulmonary symptoms.  She is from [place] and living in [place] now for 17 years.  She has never been abroad.  Patient's Medications  New Prescriptions   No medications on file  Previous Medications   ALBUTEROL (VENTOLIN HFA) 108 (90 BASE) MCG/ACT INHALER    Inhale 1-2 puffs into the lungs every 6 (six) hours as needed for wheezing or shortness of breath.   AMLODIPINE (NORVASC) 5 MG TABLET    Take 1 tablet by mouth once daily   BENAZEPRIL (LOTENSIN) 20 MG TABLET    Take 1 tablet by mouth once daily   CETIRIZINE (ZYRTEC ALLERGY) 10 MG TABLET    Take 1 tablet (10 mg total) by mouth daily.   CLONAZEPAM (KLONOPIN) 1 MG TABLET    Take 1 mg by mouth 2 (two) times daily.   CYCLOBENZAPRINE (FLEXERIL) 10 MG TABLET    Take 1 tablet (10 mg total) by mouth 2 (two) times daily as needed for muscle spasms.   FLUTICASONE (FLONASE) 50 MCG/ACT NASAL  SPRAY    Place 1 spray into both nostrils 2 (two) times daily as needed for allergies or rhinitis.   IBUPROFEN (ADVIL) 800 MG TABLET    Take 1 tablet (800 mg total) by mouth 3 (three) times daily.   LEVOTHYROXINE (SYNTHROID) 112 MCG TABLET    Take 1 tablet by mouth once daily   LIDOCAINE (LIDODERM) 5 %    Place 1 patch onto the skin daily. Remove & Discard patch within 12 hours or as directed by MD   METFORMIN (GLUCOPHAGE) 500 MG TABLET    Take 1 tablet (500 mg total) by mouth daily with breakfast.   MULTIPLE VITAMINS-CALCIUM (ONE-A-DAY WITHIN PO)    Take 1 tablet by mouth daily.   PREDNISONE (DELTASONE) 20 MG TABLET    Take 2 tablets (40 mg total) by mouth daily.   TETRAHYDROZOLINE HCL (VISINE OP)    Place 1 Drop/kg into both eyes daily as needed (dry eyes).  Modified Medications   No medications on file  Discontinued Medications   No medications on file      Past Medical History:  Diagnosis Date   Anxiety    no meds   Asthma    rarely uses inhaler - seasonal with allergies   Depression    no med   Hypertension    Hypothyroidism  Obesity    Seasonal allergies    SVD (spontaneous vaginal delivery) 1997   x    Thyroid disease     Social History   Tobacco Use   Smoking status: Never   Smokeless tobacco: Never  Vaping Use   Vaping Use: Never used  Substance Use Topics   Alcohol use: No   Drug use: No    Family History  Problem Relation Age of Onset   Cancer Mother 68       Breast   Depression Mother    Hearing loss Mother    Hypertension Mother    Breast cancer Mother    Birth defects Sister    Asthma Brother     Allergies  Allergen Reactions   Ivp Dye [Iodinated Contrast Media] Anaphylaxis and Other (See Comments)    "Almost died"   Latex Swelling and Other (See Comments)    irritation   Morphine And Related Hives   Oxycodone Hives   Penicillins Hives and Other (See Comments)    Has patient had a PCN reaction causing immediate rash, facial/tongue/throat  swelling, SOB or lightheadedness with hypotension: No Has patient had a PCN reaction causing severe rash involving mucus membranes or skin necrosis: Yes Has patient had a PCN reaction that required hospitalization No Has patient had a PCN reaction occurring within the last 10 years: No If all of the above answers are "NO", then may proceed with Cephalosporin use.    Percocet [Oxycodone-Acetaminophen] Itching    Review of Systems  Constitutional: Negative.   Respiratory: Negative.        OBJECTIVE:    Vitals:   02/01/23 1000  Weight: 252 lb (114.3 kg)  Height: 5\' 2"  (1.575 m)     Body mass index is 46.09 kg/m.  Physical Exam Constitutional:      Appearance: Normal appearance.  HENT:     Head: Normocephalic and atraumatic.  Eyes:     Extraocular Movements: Extraocular movements intact.     Conjunctiva/sclera: Conjunctivae normal.  Pulmonary:     Effort: Pulmonary effort is normal. No respiratory distress.  Abdominal:     General: There is no distension.     Palpations: Abdomen is soft.  Musculoskeletal:     Cervical back: Normal range of motion and neck supple.  Skin:    General: Skin is warm and dry.     Findings: No rash.  Neurological:     General: No focal deficit present.     Mental Status: She is alert and oriented to person, place, and time.  Psychiatric:        Mood and Affect: Mood normal.        Behavior: Behavior normal.      Labs and Microbiology:     Latest Ref Rng & Units 04/13/2022    6:35 AM 07/30/2021    9:58 AM 10/20/2020    4:08 AM  CBC  WBC 4.0 - 10.5 K/uL 9.1  6.3  7.2   Hemoglobin 12.0 - 15.0 g/dL 12.7  13.0  12.4   Hematocrit 36.0 - 46.0 % 38.0  39.9  39.0   Platelets 150 - 400 K/uL 255  266  269       Latest Ref Rng & Units 04/13/2022    6:35 AM 07/30/2021    9:58 AM 01/27/2021   10:28 AM  CMP  Glucose 70 - 99 mg/dL 119  102  126   BUN 6 - 20 mg/dL 17  12  13  Creatinine 0.44 - 1.00 mg/dL 0.88  0.90  0.97   Sodium 135 - 145  mmol/L 139  140  144   Potassium 3.5 - 5.1 mmol/L 3.4  4.1  4.2   Chloride 98 - 111 mmol/L 107  107  109   CO2 22 - 32 mmol/L 27  25  29    Calcium 8.9 - 10.3 mg/dL 9.0  9.1  9.0   Total Protein 6.1 - 8.1 g/dL  7.0  6.8   Total Bilirubin 0.2 - 1.2 mg/dL  0.4  0.5   AST 10 - 35 U/L  9  10   ALT 6 - 29 U/L  12  12       ASSESSMENT & PLAN:    TB lung, latent Patient here from rheumatology after Quantiferon was positive in the setting of screening for DMARD therapy to treat inflammatory arthritis.  She has no TB risk factors and no pulmonary symptoms.  Discussed and recommended treatment for LTBI and she is agreeable.  Will get CXR and labs today for baseline. Will also screen for HIV to be complete.  Follow up in 6 weeks and plan for likely Rifampin x 4 months for treatment.  Would be okay with her starting DMARD treatment once she has completed about 1 month of LTBI treatment.    Orders Placed This Encounter  Procedures   DG Chest 2 View    Standing Status:   Future    Standing Expiration Date:   02/01/2024    Order Specific Question:   Reason for Exam (SYMPTOM  OR DIAGNOSIS REQUIRED)    Answer:   Latent TB    Order Specific Question:   Is patient pregnant?    Answer:   No    Order Specific Question:   Preferred imaging location?    Answer:   [facility]   COMPLETE METABOLIC PANEL WITH GFR   CBC   HIV Antibody (routine testing w rflx)       [facility] 02/01/2023, 10:19 AM

## 2023-02-01 NOTE — Patient Instructions (Signed)
Thank you for coming to see me today. It was a pleasure seeing you.  To Do: Get Chest X-Ray If that looks okay then I will prescribe medication to treat latent TB Labs today See me in about 6 weeks  If you have any questions or concerns, please do not hesitate to call the office at [phone].  Take Care,   Janice Roberts

## 2023-02-02 LAB — COMPLETE METABOLIC PANEL WITH GFR
AG Ratio: 1.3 (calc) (ref 1.0–2.5)
ALT: 11 U/L (ref 6–29)
AST: 8 U/L — ABNORMAL LOW (ref 10–35)
Albumin: 4.1 g/dL (ref 3.6–5.1)
Alkaline phosphatase (APISO): 72 U/L (ref 31–125)
BUN: 19 mg/dL (ref 7–25)
CO2: 29 mmol/L (ref 20–32)
Calcium: 9.2 mg/dL (ref 8.6–10.2)
Chloride: 105 mmol/L (ref 98–110)
Creat: 0.81 mg/dL (ref 0.50–0.99)
Globulin: 3.1 g/dL (calc) (ref 1.9–3.7)
Glucose, Bld: 101 mg/dL — ABNORMAL HIGH (ref 65–99)
Potassium: 4.2 mmol/L (ref 3.5–5.3)
Sodium: 141 mmol/L (ref 135–146)
Total Bilirubin: 0.4 mg/dL (ref 0.2–1.2)
Total Protein: 7.2 g/dL (ref 6.1–8.1)
eGFR: 89 mL/min/{1.73_m2} (ref >=60)

## 2023-02-02 LAB — CBC
HCT: 39.2 % (ref 35.0–45.0)
Hemoglobin: 13 g/dL (ref 11.7–15.5)
MCH: 28.8 pg (ref 27.0–33.0)
MCHC: 33.2 g/dL (ref 32.0–36.0)
MCV: 86.7 fL (ref 80.0–100.0)
MPV: 10.7 fL (ref 7.5–12.5)
Platelets: 282 10*3/uL (ref 140–400)
RBC: 4.52 10*6/uL (ref 3.80–5.10)
RDW: 13.8 % (ref 11.0–15.0)
WBC: 11.2 10*3/uL — ABNORMAL HIGH (ref 3.8–10.8)

## 2023-02-02 LAB — HIV ANTIBODY (ROUTINE TESTING W REFLEX): HIV 1&2 Ab, 4th Generation: NONREACTIVE

## 2023-02-04 ENCOUNTER — Other Ambulatory Visit: Payer: Self-pay | Admitting: Internal Medicine

## 2023-02-04 MED ORDER — RIFAMPIN 300 MG PO CAPS: 600.0000 mg | ORAL_CAPSULE | Freq: Every day | ORAL | 3 refills | Status: AC

## 2023-02-07 ENCOUNTER — Telehealth: Payer: Self-pay

## 2023-02-07 NOTE — Telephone Encounter (Signed)
Called patient to go over results, no answer. Left HIPAA compliant voicemail stating MyChart message would be sent and to call with any questions.   Beryle Flock, RN

## 2023-02-07 NOTE — Telephone Encounter (Signed)
-----   Message from Mignon Pine, DO sent at 02/04/2023 12:01 PM EDT ----- Can you please let patient know that her labs from 3 days ago were normal and CXR negative for active TB.  I will send Rifampin 600mg  daily to her pharmacy.   Please have her reach out if any issues getting the medication.   Mitzi Hansen

## 2023-02-08 NOTE — Telephone Encounter (Signed)
Spoke with [place], relayed per Dr. Juleen China that labs from last week were normal and chest Xray was negative for active TB. Notified her that rifampin was sent to the pharmacy. Patient verbalized understanding and has no further questions.   Beryle Flock, RN

## 2023-02-18 ENCOUNTER — Encounter: Payer: Self-pay | Admitting: Sports Medicine

## 2023-02-18 ENCOUNTER — Ambulatory Visit (INDEPENDENT_AMBULATORY_CARE_PROVIDER_SITE_OTHER): Payer: Managed Care, Other (non HMO) | Admitting: Sports Medicine

## 2023-02-18 DIAGNOSIS — M170 Bilateral primary osteoarthritis of knee: Secondary | ICD-10-CM

## 2023-02-18 DIAGNOSIS — S83207A Unspecified tear of unspecified meniscus, current injury, left knee, initial encounter: Secondary | ICD-10-CM | POA: Diagnosis not present

## 2023-02-18 DIAGNOSIS — G8929 Other chronic pain: Secondary | ICD-10-CM | POA: Diagnosis not present

## 2023-02-18 DIAGNOSIS — M25562 Pain in left knee: Secondary | ICD-10-CM

## 2023-02-18 NOTE — Progress Notes (Signed)
Janice Roberts - 47 y.o. female MRN [id number]  Date of birth: November 14, 1975  Office Visit Note: Visit Date: 02/18/2023 PCP: Ollen Bowl, MD Referred by: Ollen Bowl, MD  Subjective: Chief Complaint  Patient presents with   Right Knee - Pain, Follow-up   Left Knee - Pain, Follow-up   HPI: Janice Roberts is a pleasant 47 y.o. female who presents today for follow-up of left knee pain.   She is following up for her left knee pain.  Her right knee does fine with conservative measures although she knows she still has some arthritis.  I saw her back about 1 month ago and we did proceed with corticosteroid injection, unfortunately Elianne feels like this did not help her pain.  Her pain is actually worsening and she continues with clicking and catching about the knee.  There is some feelings of instability with twisting.  Swelling will come and go depending on her activity.  Pain continues over the medial aspect of the anterior knee.  Ibuprofen and Tylenol does help control her pain to some extent.  Pertinent ROS were reviewed with the patient and found to be negative unless otherwise specified above in HPI.   Assessment & Plan: Visit Diagnoses:  1. Positive McMurray test of left knee, initial encounter   2. Chronic pain of left knee   3. Bilateral primary osteoarthritis of knee    Plan: Discussed with Kazandra the likely etiology of her left knee pain, which is very suggestive of meniscal pathology.  She has failed conservative measures with home rehab therapy for many weeks, medication therapy, injection therapy.  I think at this point we need to obtain an MRI of the knee to evaluate for the meniscus and other internal pathology that are likely affecting the knee.  We did try to fit her for a knee brace today, although none more comfortable for her.  Did give her information to purchase one over-the-counter.  We will follow-up in 3 business days after MRI to discuss next steps.  May  continue Tylenol, ibuprofen and topical medications at this time.  Follow-up: Return for F/u 3 business days after MRI scan of knee.   Meds & Orders: No orders of the defined types were placed in this encounter.   Orders Placed This Encounter  Procedures   MR Knee Left w/o contrast     Procedures: No procedures performed      Clinical History: No specialty comments available.  She reports that she has never smoked. She has never used smokeless tobacco. No results for input(s): "HGBA1C", "LABURIC" in the last 8760 hours.  Objective:   Vital Signs: LMP 09/30/2017   Physical Exam  Gen: Well-appearing, in no acute distress; non-toxic CV: Regular Rate. Well-perfused. Warm.  Resp: Breathing unlabored on room air; no wheezing. Psych: Fluid speech in conversation; appropriate affect; normal thought process Neuro: Sensation intact throughout. No gross coordination deficits.   Ortho Exam - Left knee: There is a trace effusion within the knee without warmth or redness.  Positive TTP over the medial joint Roberts.  There is bilateral patellar crepitus.  Range of motion of the left knee from 0-130 degrees with pain at endrange flexion.  There is a reproducible McMurray's test today with both pain and associated clicking.  There is pseudo instability of valgus stress.  Positive Thessaly's test on the left.  Imaging:  4 views of bilateral knees including standing AP, Rosenberg, lateral and sunrise views were ordered and reviewed  by myself.  X-rays demonstrate moderate medial joint space narrowing of the left knee, mild medial joint space narrowing of the right knee.  There are some spurring off the left knee medial femoral condyle medial tibial plateau.  There is moderate patellofemoral arthritic change of the left knee.  No acute fracture or bony abnormality noted.   Past Medical/Family/Surgical/Social History: Medications & Allergies reviewed per EMR, new medications updated. Patient  Active Problem List   Diagnosis Date Noted   TB lung, latent 02/01/2023   Chronic bilateral low back pain without sciatica 08/02/2021   Migraines 10/02/2020   Other insomnia 02/27/2020   Snoring 02/27/2020   Sicca syndrome, unspecified 01/30/2020   Mood disorder 01/30/2020   Panic disorder 12/13/2019   Seasonal allergies 11/30/2019   Borderline diabetic 09/05/2019   PTSD (post-traumatic stress disorder) 07/13/2019   Endometriosis determined by laparoscopy 02/11/2017   PID (acute pelvic inflammatory disease) 11/17/2016   Pelvic pain 11/15/2016   Family history of breast cancer in mother 10/21/2016   Pelvic pain in female 11/10/2015   Anxiety    Depression    Asthma    Class 3 obesity    Hypertension    Thyroid disease    Past Medical History:  Diagnosis Date   Anxiety    no meds   Asthma    rarely uses inhaler - seasonal with allergies   Depression    no med   Hypertension    Hypothyroidism    Obesity    Seasonal allergies    SVD (spontaneous vaginal delivery) 1997   x    Thyroid disease    Family History  Problem Relation Age of Onset   Cancer Mother 20       Breast   Depression Mother    Hearing loss Mother    Hypertension Mother    Breast cancer Mother    Birth defects Sister    Asthma Brother    Past Surgical History:  Procedure Laterality Date   ABDOMINAL HYSTERECTOMY     APPENDECTOMY     CESAREAN SECTION  1993   x    DILATION AND CURETTAGE OF UTERUS  08/08/2008 & 03/08/2013   2009 missed ab, 2014 bleeding   LAPAROSCOPIC VAGINAL HYSTERECTOMY WITH SALPINGO OOPHORECTOMY Bilateral 12/06/2017   Procedure: LAPAROSCOPIC ASSISTED VAGINAL HYSTERECTOMY WITH SALPINGO OOPHORECTOMY;  Surgeon: Adam Phenix, MD;  Location: [facility];  Service: Gynecology;  Laterality: Bilateral;   LAPAROSCOPY N/A 01/24/2017   Procedure: LAPAROSCOPY DIAGNOSTIC;  Surgeon: Adam Phenix, MD;  Location: [facility];  Service: Gynecology;  Laterality: N/A;   SHOULDER SURGERY Left     THYROIDECTOMY     WISDOM TOOTH EXTRACTION     Social History   Occupational History   Not on file  Tobacco Use   Smoking status: Never   Smokeless tobacco: Never  Vaping Use   Vaping Use: Never used  Substance and Sexual Activity   Alcohol use: No   Drug use: No   Sexual activity: Not Currently    Birth control/protection: None

## 2023-03-13 ENCOUNTER — Ambulatory Visit
Admission: RE | Admit: 2023-03-13 | Discharge: 2023-03-13 | Disposition: A | Discharge status: Home / Self Care | Payer: Managed Care, Other (non HMO) | Source: Ambulatory Visit | Attending: Sports Medicine

## 2023-03-13 DIAGNOSIS — S83207A Unspecified tear of unspecified meniscus, current injury, left knee, initial encounter: Secondary | ICD-10-CM

## 2023-03-13 DIAGNOSIS — G8929 Other chronic pain: Secondary | ICD-10-CM

## 2023-03-13 DIAGNOSIS — M170 Bilateral primary osteoarthritis of knee: Secondary | ICD-10-CM

## 2023-03-16 ENCOUNTER — Encounter: Payer: Self-pay | Admitting: Sports Medicine

## 2023-03-16 ENCOUNTER — Ambulatory Visit (INDEPENDENT_AMBULATORY_CARE_PROVIDER_SITE_OTHER): Payer: Managed Care, Other (non HMO) | Admitting: Sports Medicine

## 2023-03-16 DIAGNOSIS — M170 Bilateral primary osteoarthritis of knee: Secondary | ICD-10-CM | POA: Diagnosis not present

## 2023-03-16 DIAGNOSIS — M25562 Pain in left knee: Secondary | ICD-10-CM

## 2023-03-16 DIAGNOSIS — G8929 Other chronic pain: Secondary | ICD-10-CM | POA: Diagnosis not present

## 2023-03-16 DIAGNOSIS — S83242A Other tear of medial meniscus, current injury, left knee, initial encounter: Secondary | ICD-10-CM | POA: Diagnosis not present

## 2023-03-16 MED ORDER — MELOXICAM 15 MG PO TABS: 15.0000 mg | ORAL_TABLET | Freq: Every day | ORAL | 1 refills | Status: DC

## 2023-03-16 NOTE — Progress Notes (Signed)
Janice Roberts - 47 y.o. female MRN [id number]  Date of birth: April 21, 1976  Office Visit Note: Visit Date: 03/16/2023 PCP: Ollen Bowl, MD Referred by: Ollen Bowl, MD  Subjective: Chief Complaint  Patient presents with   Left Knee - Follow-up   HPI: Janice Roberts is a pleasant 47 y.o. female who presents today for follow-up of left acute on chronic knee pain.  Jesyka does state that her left knee pain is improving, although certainly not resolved.  She continues with her home exercise therapy once daily.  She is alternating between ibuprofen as well as Tylenol and IcyHot.  Has been looking at over-the-counter bracing, bracing in the office previously did not fit her well.  Still having some feelings of the inside of the knee giving way but she has not had any falls.  There is noted some crepitus within the knee.  In general is noticing improvement in reduction in her pain since our initial evaluation.  Pertinent ROS were reviewed with the patient and found to be negative unless otherwise specified above in HPI.   Assessment & Plan: Visit Diagnoses:  1. Other tear of medial meniscus of left knee, unspecified whether old or current tear, initial encounter   2. Bilateral primary osteoarthritis of knee   3. Chronic pain of left knee    Plan: Discussed with Tyree treatment options for her knee pain which does have mild osteoarthritis but an MRI confirmed medial meniscal tear.  Review of the MRI with her in the room does show that this is more of a horizontal, chronic tear.  It is not near the reach and so we discussed trialing treatment for conservative measures first versus meniscectomy.  She is agreeable to this plan and would like to hold off on surgery if possible.  She will obtain a neoprene compression sleeve for support.  I do want her to start icing the knee in the evenings to help reduce the swelling.  We will start her on meloxicam 15 mg to be taken once daily for pain  and anti-inflammatory effect.  She may use Tylenol with this as needed.  I would like to see her back in about 2 months as I am hopeful that she will continue to have improvement.  If she is not finding improvement or she is having continued episodes of instability or giving way, we can always have a discussion with one of my surgical partners for possible meniscectomy, will hold for now.  Follow-up in 2 months.  Follow-up: Return in about 2 months (around 05/16/2023) for left knee .   Meds & Orders:  Meds ordered this encounter  Medications   meloxicam (MOBIC) 15 MG tablet    Sig: Take 1 tablet (15 mg total) by mouth daily.    Dispense:  30 tablet    Refill:  1   No orders of the defined types were placed in this encounter.    Procedures: No procedures performed      Clinical History: No specialty comments available.  She reports that she has never smoked. She has never used smokeless tobacco. No results for input(s): "HGBA1C", "LABURIC" in the last 8760 hours.  Objective:   Vital Signs: LMP 09/30/2017   Physical Exam  Gen: Well-appearing, in no acute distress; non-toxic CV: Well-perfused. Warm.  Resp: Breathing unlabored on room air; no wheezing. Psych: Fluid speech in conversation; appropriate affect; normal thought process Neuro: Sensation intact throughout. No gross coordination deficits.   Ortho  Exam - Left knee: There is a trace effusion of the left knee without significant warmth.  Positive TTP over the medial joint line and posterior aspect of the medial femoral condyle.  There is some mild pain with valgus stress and McMurray's testing without reproducible click.  Imaging: MR Knee Left w/o contrast CLINICAL DATA:  Anterior and medial knee pain for the past 2 months. No injury or prior surgery.  EXAM: MRI OF THE LEFT KNEE WITHOUT CONTRAST  TECHNIQUE: Multiplanar, multisequence MR imaging of the knee was performed. No intravenous contrast was  administered.  COMPARISON:  Left knee x-rays dated January 21, 2023.  FINDINGS: MENISCI  Medial meniscus: Horizontal tear of the posterior horn. Free edge fraying of the midbody.  Lateral meniscus:  Intact.  LIGAMENTS  Cruciates:  Intact ACL and PCL.  Collaterals: Medial collateral ligament is intact. Lateral collateral ligament complex is intact.  CARTILAGE  Patellofemoral: Scattered areas of full-thickness cartilage fissuring over the patella without focal defect. Mild partial-thickness cartilage loss over the trochlea.  Medial: Full-thickness cartilage loss over the posterior weight-bearing medial femoral condyle. Mild partial-thickness cartilage loss over the medial tibial plateau.  Lateral: Diffuse cartilage thinning over the lateral femoral condyle.  Joint:  Small joint effusion.  Normal Hoffa's fat.  Popliteal Fossa:  No Baker cyst. Intact popliteus tendon.  Extensor Mechanism: Intact quadriceps tendon and patellar tendon. Intact medial and lateral patellar retinaculum. Intact MPFL.  Bones: No acute fracture or dislocation. No suspicious bone lesion. Small tricompartmental marginal osteophytes.  Other: None.  IMPRESSION: 1. Horizontal tear of the medial meniscus posterior horn. Free edge fraying of the midbody. 2. Mild tricompartmental osteoarthritis. 3. Small joint effusion.  Electronically Signed   By: Obie Dredge M.D.   On: 03/15/2023 15:44    Past Medical/Family/Surgical/Social History: Medications & Allergies reviewed per EMR, new medications updated. Patient Active Problem List   Diagnosis Date Noted   TB lung, latent 02/01/2023   Chronic bilateral low back pain without sciatica 08/02/2021   Migraines 10/02/2020   Other insomnia 02/27/2020   Snoring 02/27/2020   Sicca syndrome, unspecified 01/30/2020   Mood disorder (HCC) 01/30/2020   Panic disorder 12/13/2019   Seasonal allergies 11/30/2019   Borderline diabetic 09/05/2019   PTSD  (post-traumatic stress disorder) 07/13/2019   Endometriosis determined by laparoscopy 02/11/2017   PID (acute pelvic inflammatory disease) 11/17/2016   Pelvic pain 11/15/2016   Family history of breast cancer in mother 10/21/2016   Pelvic pain in female 11/10/2015   Anxiety    Depression    Asthma    Class 3 obesity (HCC)    Hypertension    Thyroid disease    Past Medical History:  Diagnosis Date   Anxiety    no meds   Asthma    rarely uses inhaler - seasonal with allergies   Depression    no med   Hypertension    Hypothyroidism    Obesity    Seasonal allergies    SVD (spontaneous vaginal delivery) 1997   x    Thyroid disease    Family History  Problem Relation Age of Onset   Cancer Mother 13       Breast   Depression Mother    Hearing loss Mother    Hypertension Mother    Breast cancer Mother    Birth defects Sister    Asthma Brother    Past Surgical History:  Procedure Laterality Date   ABDOMINAL HYSTERECTOMY     APPENDECTOMY  CESAREAN SECTION  1993   x    DILATION AND CURETTAGE OF UTERUS  08/08/2008 & 03/08/2013   2009 missed ab, 2014 bleeding   LAPAROSCOPIC VAGINAL HYSTERECTOMY WITH SALPINGO OOPHORECTOMY Bilateral 12/06/2017   Procedure: LAPAROSCOPIC ASSISTED VAGINAL HYSTERECTOMY WITH SALPINGO OOPHORECTOMY;  Surgeon: Adam Phenix, MD;  Location: [facility];  Service: Gynecology;  Laterality: Bilateral;   LAPAROSCOPY N/A 01/24/2017   Procedure: LAPAROSCOPY DIAGNOSTIC;  Surgeon: Adam Phenix, MD;  Location: [facility];  Service: Gynecology;  Laterality: N/A;   SHOULDER SURGERY Left    THYROIDECTOMY     WISDOM TOOTH EXTRACTION     Social History   Occupational History   Not on file  Tobacco Use   Smoking status: Never   Smokeless tobacco: Never  Vaping Use   Vaping Use: Never used  Substance and Sexual Activity   Alcohol use: No   Drug use: No   Sexual activity: Not Currently    Birth control/protection: None   I spent 35 minutes in the care of the  patient today including face-to-face time, preparation to see the patient, as well as independent with review and interpretation of MRI, review of imaging and discussion on nature of meniscal tearing with patient today, counseling and educating the patient on home exercise plan, compression sleeve therapy, discussion on outcomes for conservative measures versus meniscectomy and possible surgical needs that could be performed by my orthopedic colleagues for the above diagnoses.   Madelyn Brunner, DO Primary Care Sports Medicine Physician  [facility]  This note was dictated using Dragon naturally speaking software and may contain errors in syntax, spelling, or content which have not been identified prior to signing this note.

## 2023-03-21 ENCOUNTER — Ambulatory Visit: Payer: Managed Care, Other (non HMO) | Admitting: Internal Medicine

## 2023-04-11 ENCOUNTER — Encounter: Payer: Self-pay | Admitting: Family

## 2023-04-11 ENCOUNTER — Ambulatory Visit (INDEPENDENT_AMBULATORY_CARE_PROVIDER_SITE_OTHER): Payer: Managed Care, Other (non HMO) | Admitting: Family

## 2023-04-11 ENCOUNTER — Other Ambulatory Visit: Payer: Self-pay

## 2023-04-11 VITALS — BP 130/87 | HR 63 | Temp 97.7°F | Ht 62.0 in | Wt 251.0 lb

## 2023-04-11 DIAGNOSIS — Z227 Latent tuberculosis: Secondary | ICD-10-CM

## 2023-04-11 NOTE — Assessment & Plan Note (Addendum)
Ms. Winegar has completed her first month of rifampin with good adherence and no significant adverse side effects. Reviewed previous lab work and the rationale and goals of treatment in addition to the need for therapeutic drug monitoring.  Check CMET. Continue current dose of Rifampin with projected end date of 05/27/23. Likely okay to start DMARD treatment if needed and appropriate per Rheumatology.  Plan for follow up in 2 months or sooner if needed at end of treatment.

## 2023-04-11 NOTE — Patient Instructions (Signed)
Nice to see you.  We will check your lab work today.  Continue to take your medication daily as prescribed.  Plan for follow up in 2 months or sooner if needed with lab work on the same day.  Have a great day and stay safe!  

## 2023-04-11 NOTE — Progress Notes (Signed)
Subjective:    Patient ID: Janice Roberts, female    DOB: [birth year], 47 y.o.   MRN: [id number]  Chief Complaint  Patient presents with   Follow-up    Latent tuberculosis     HPI:  Janice Roberts is a 47 y.o. 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 Earlene Plater with negative chest x-ray and no signs of active TB and started on Rifampin for 4 months. HIV testing and Hepatitis B testing was negative. Here today for follow up.    Janice Roberts has been doing well since her last office visit and taking Rifampin as prescribed with no significant adverse side effects denying any new arthralgias, nausea, anorexia, fatigue, itching or rash. Picking up next prescription of medication today and has 1 refill remaining. Has not yet started on DMARD treatment.  Allergies  Allergen Reactions   Ivp Dye [Iodinated Contrast Media] Anaphylaxis and Other (See Comments)    "Almost died"   Latex Swelling and Other (See Comments)    irritation   Morphine And Codeine Hives   Oxycodone Hives   Penicillins Hives and Other (See Comments)    Has patient had a PCN reaction causing immediate rash, facial/tongue/throat swelling, SOB or lightheadedness with hypotension: No Has patient had a PCN reaction causing severe rash involving mucus membranes or skin necrosis: Yes Has patient had a PCN reaction that required hospitalization No Has patient had a PCN reaction occurring within the last 10 years: No If all of the above answers are "NO", then may proceed with Cephalosporin use.    Percocet [Oxycodone-Acetaminophen] Itching      Outpatient Medications Prior to Visit  Medication Sig Dispense Refill   albuterol (VENTOLIN HFA) 108 (90 Base) MCG/ACT inhaler Inhale 1-2 puffs into the lungs every 6 (six) hours as needed for wheezing or shortness of breath. 18 g 0   amLODipine (NORVASC) 5 MG tablet Take 1 tablet by mouth once daily 90 tablet 0   benazepril  (LOTENSIN) 20 MG tablet Take 1 tablet by mouth once daily 90 tablet 0   cetirizine (ZYRTEC ALLERGY) 10 MG tablet Take 1 tablet (10 mg total) by mouth daily. 30 tablet 0   clonazePAM (KLONOPIN) 1 MG tablet Take 1 mg by mouth 2 (two) times daily.     fluticasone (FLONASE) 50 MCG/ACT nasal spray Place 1 spray into both nostrils 2 (two) times daily as needed for allergies or rhinitis. 16 g 2   levothyroxine (SYNTHROID) 112 MCG tablet Take 1 tablet by mouth once daily 90 tablet 0   lidocaine (LIDODERM) 5 % Place 1 patch onto the skin daily. Remove & Discard patch within 12 hours or as directed by MD 20 patch 0   meloxicam (MOBIC) 15 MG tablet Take 1 tablet (15 mg total) by mouth daily. 30 tablet 1   metFORMIN (GLUCOPHAGE) 500 MG tablet Take 1 tablet (500 mg total) by mouth daily with breakfast. 90 tablet 1   Multiple Vitamins-Calcium (ONE-A-DAY WITHIN PO) Take 1 tablet by mouth daily.     rifampin (RIFADIN) 300 MG capsule Take 2 capsules (600 mg total) by mouth daily. 60 capsule 3   Tetrahydrozoline HCl (VISINE OP) Place 1 Drop/kg into both eyes daily as needed (dry eyes).     cyclobenzaprine (FLEXERIL) 10 MG tablet Take 1 tablet (10 mg total) by mouth 2 (two) times daily as needed for muscle spasms. (Patient not taking: Reported on 04/11/2023) 20 tablet 0  predniSONE (DELTASONE) 20 MG tablet Take 2 tablets (40 mg total) by mouth daily. (Patient not taking: Reported on 04/11/2023) 12 tablet 0   No facility-administered medications prior to visit.     Past Medical History:  Diagnosis Date   Anxiety    no meds   Asthma    rarely uses inhaler - seasonal with allergies   Depression    no med   Hypertension    Hypothyroidism    Obesity    Seasonal allergies    SVD (spontaneous vaginal delivery) 1997   x    Thyroid disease      Past Surgical History:  Procedure Laterality Date   ABDOMINAL HYSTERECTOMY     APPENDECTOMY     CESAREAN SECTION  1993   x    DILATION AND CURETTAGE OF UTERUS   08/08/2008 & 03/08/2013   2009 missed ab, 2014 bleeding   LAPAROSCOPIC VAGINAL HYSTERECTOMY WITH SALPINGO OOPHORECTOMY Bilateral 12/06/2017   Procedure: LAPAROSCOPIC ASSISTED VAGINAL HYSTERECTOMY WITH SALPINGO OOPHORECTOMY;  Surgeon: Adam Phenix, MD;  Location: [facility];  Service: Gynecology;  Laterality: Bilateral;   LAPAROSCOPY N/A 01/24/2017   Procedure: LAPAROSCOPY DIAGNOSTIC;  Surgeon: Adam Phenix, MD;  Location: [facility];  Service: Gynecology;  Laterality: N/A;   SHOULDER SURGERY Left    THYROIDECTOMY     WISDOM TOOTH EXTRACTION         Review of Systems  Constitutional:  Negative for chills, diaphoresis, fatigue and fever.  Respiratory:  Negative for cough, chest tightness, shortness of breath and wheezing.   Cardiovascular:  Negative for chest pain.  Gastrointestinal:  Negative for abdominal pain, diarrhea, nausea and vomiting.      Objective:    BP 130/87   Pulse 63   Temp 97.7 F (36.5 C) (Temporal)   Ht 5\' 2"  (1.575 m)   Wt 251 lb (113.9 kg)   LMP 09/30/2017   SpO2 98%   BMI 45.91 kg/m  Nursing note and vital signs reviewed.  Physical Exam Constitutional:      General: She is not in acute distress.    Appearance: She is well-developed.  Cardiovascular:     Rate and Rhythm: Normal rate and regular rhythm.     Heart sounds: Normal heart sounds.  Pulmonary:     Effort: Pulmonary effort is normal.     Breath sounds: Normal breath sounds.  Skin:    General: Skin is warm and dry.  Neurological:     Mental Status: She is alert.         04/11/2023    8:45 AM 11/13/2021    9:18 AM 07/30/2021    9:57 AM 12/15/2020    7:56 AM 01/09/2020    9:05 AM  Depression screen PHQ 2/9  Decreased Interest 0 3 3 0 3  Down, Depressed, Hopeless 0 3 3 0 3  PHQ - 2 Score 0 6 6 0 6  Altered sleeping  2 3  3   Tired, decreased energy  3 3  3   Change in appetite  1 2  0  Feeling bad or failure about yourself   3 3  1   Trouble concentrating  2 2  1   Moving slowly or  fidgety/restless  3 2  0  Suicidal thoughts  3 3  1   PHQ-9 Score  23 24  15   Difficult doing work/chores   Somewhat difficult  Very difficult       Assessment & Plan:    Patient Active Problem  List   Diagnosis Date Noted   TB lung, latent 02/01/2023   Chronic bilateral low back pain without sciatica 08/02/2021   Migraines 10/02/2020   Other insomnia 02/27/2020   Snoring 02/27/2020   Sicca syndrome, unspecified 01/30/2020   Mood disorder (HCC) 01/30/2020   Panic disorder 12/13/2019   Seasonal allergies 11/30/2019   Borderline diabetic 09/05/2019   PTSD (post-traumatic stress disorder) 07/13/2019   Endometriosis determined by laparoscopy 02/11/2017   PID (acute pelvic inflammatory disease) 11/17/2016   Pelvic pain 11/15/2016   Family history of breast cancer in mother 10/21/2016   Pelvic pain in female 11/10/2015   Anxiety    Depression    Asthma    Class 3 obesity (HCC)    Hypertension    Thyroid disease      Problem List Items Addressed This Visit       Other   TB lung, latent - Primary    Janice Roberts has completed her first month of rifampin with good adherence and no significant adverse side effects. Reviewed previous lab work and the rationale and goals of treatment in addition to the need for therapeutic drug monitoring.  Check CMET. Continue current dose of Rifampin with projected end date of 05/27/23. Likely okay to start DMARD treatment if needed and appropriate per Rheumatology.  Plan for follow up in 2 months or sooner if needed at end of treatment.       Relevant Orders   COMPLETE METABOLIC PANEL WITH GFR     I am having Aprel D. Moehring maintain her Multiple Vitamins-Calcium (ONE-A-DAY WITHIN PO), Tetrahydrozoline HCl (VISINE OP), fluticasone, cetirizine, albuterol, metFORMIN, clonazePAM, benazepril, amLODipine, levothyroxine, cyclobenzaprine, lidocaine, predniSONE, rifampin, and meloxicam.   Follow-up: Return in about 2 months (around 06/11/2023).   Marcos Eke, MSN, FNP-C Nurse Practitioner [facility] Main number: [phone]

## 2023-04-12 LAB — COMPLETE METABOLIC PANEL WITH GFR
AG Ratio: 1.5 (calc) (ref 1.0–2.5)
ALT: 32 U/L — ABNORMAL HIGH (ref 6–29)
AST: 17 U/L (ref 10–35)
Albumin: 3.9 g/dL (ref 3.6–5.1)
Alkaline phosphatase (APISO): 69 U/L (ref 31–125)
BUN: 16 mg/dL (ref 7–25)
CO2: 26 mmol/L (ref 20–32)
Calcium: 9 mg/dL (ref 8.6–10.2)
Chloride: 109 mmol/L (ref 98–110)
Creat: 0.74 mg/dL (ref 0.50–0.99)
Globulin: 2.6 g/dL (calc) (ref 1.9–3.7)
Glucose, Bld: 113 mg/dL — ABNORMAL HIGH (ref 65–99)
Potassium: 4.2 mmol/L (ref 3.5–5.3)
Sodium: 142 mmol/L (ref 135–146)
Total Bilirubin: 0.5 mg/dL (ref 0.2–1.2)
Total Protein: 6.5 g/dL (ref 6.1–8.1)
eGFR: 100 mL/min/{1.73_m2} (ref >=60)

## 2023-05-12 ENCOUNTER — Other Ambulatory Visit: Payer: Self-pay | Admitting: Sports Medicine

## 2023-05-16 ENCOUNTER — Encounter: Payer: Self-pay | Admitting: Sports Medicine

## 2023-05-16 ENCOUNTER — Ambulatory Visit (INDEPENDENT_AMBULATORY_CARE_PROVIDER_SITE_OTHER): Payer: Managed Care, Other (non HMO) | Admitting: Sports Medicine

## 2023-05-16 DIAGNOSIS — S83242A Other tear of medial meniscus, current injury, left knee, initial encounter: Secondary | ICD-10-CM

## 2023-05-16 DIAGNOSIS — M25562 Pain in left knee: Secondary | ICD-10-CM | POA: Diagnosis not present

## 2023-05-16 DIAGNOSIS — G8929 Other chronic pain: Secondary | ICD-10-CM

## 2023-05-16 MED ORDER — MELOXICAM 15 MG PO TABS: 15.0000 mg | ORAL_TABLET | Freq: Every day | ORAL | 0 refills | Status: DC

## 2023-05-16 NOTE — Progress Notes (Signed)
Janice Roberts - 47 y.o. female MRN [id number]  Date of birth: 01-Nov-1976  Office Visit Note: Visit Date: 05/16/2023 PCP: Ollen Bowl, MD Referred by: Ollen Bowl, MD  Subjective: Chief Complaint  Patient presents with   Left Knee - Follow-up   HPI: Janice Roberts is a pleasant 47 y.o. female who presents today for follow-up of left knee pain with known degenerative medial meniscus tear.  She is doing very well. The knee is not quite 100% but much better, no more giving out, no swelling. She is doing home exercise every day - notices improvement. Taking Meloxicam 15mg  every day.  OTC braces didn't fit her knee.  Pertinent ROS were reviewed with the patient and found to be negative unless otherwise specified above in HPI.   Assessment & Plan: Visit Diagnoses:  1. Chronic pain of left knee   2. 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  She is not quite 100% but is much improved and has minimal to no pain with daily function.  Noticing improvement in stability as well.  I would like her to continue her home exercises daily for the next 6 weeks, she may then transition to only twice weekly more for prevention at that time.  She will continue meloxicam 15 mg once daily, I will refill this for 1 month and then at that point she will work on discontinuing.  She may obtain a neoprene compression sleeve if she wants although has not needed this.  Did discuss body helix.  She will follow-up with me as needed.  Follow-up: Return if symptoms worsen or fail to improve.   Meds & Orders: No orders of the defined types were placed in this encounter.  No orders of the defined types were placed in this encounter.    Procedures: No procedures performed      Clinical History: No specialty comments  available.  She reports that she has never smoked. She has never used smokeless tobacco. No results for input(s): "HGBA1C", "LABURIC" in the last 8760 hours.  Objective:   Vital Signs: LMP 09/30/2017   Physical Exam  Gen: Well-appearing, in no acute distress; non-toxic CV: Well-perfused. Warm.  Resp: Breathing unlabored on room air; no wheezing. Psych: Fluid speech in conversation; appropriate affect; normal thought process Neuro: Sensation intact throughout. No gross coordination deficits.   Ortho Exam - Left knee: + Mild TTP over the medial joint line, there is a reproducible McMurray's test with clicking although no significant pain.  No varus or valgus instability.  Range of motion preserved from 0-130 degrees.  No joint effusion.  Imaging: MR Knee Left w/o contrast CLINICAL DATA:  Anterior and medial knee pain for the past 2 months. No injury or prior surgery.  EXAM: MRI OF THE LEFT KNEE WITHOUT CONTRAST  TECHNIQUE: Multiplanar, multisequence MR imaging of the knee was performed. No intravenous contrast was administered.  COMPARISON:  Left knee x-rays dated January 21, 2023.  FINDINGS: MENISCI  Medial meniscus: Horizontal tear of the posterior horn. Free edge fraying of the midbody.  Lateral meniscus:  Intact.  LIGAMENTS  Cruciates:  Intact ACL and PCL.  Collaterals: Medial collateral ligament is intact. Lateral collateral ligament complex is intact.  CARTILAGE  Patellofemoral: Scattered areas of full-thickness cartilage fissuring over the patella without focal defect.  Mild partial-thickness cartilage loss over the trochlea.  Medial: Full-thickness cartilage loss over the posterior weight-bearing medial femoral condyle. Mild partial-thickness cartilage loss over the medial tibial plateau.  Lateral: Diffuse cartilage thinning over the lateral femoral condyle.  Joint:  Small joint effusion.  Normal Hoffa's fat.  Popliteal Fossa:  No Baker cyst. Intact  popliteus tendon.  Extensor Mechanism: Intact quadriceps tendon and patellar tendon. Intact medial and lateral patellar retinaculum. Intact MPFL.  Bones: No acute fracture or dislocation. No suspicious bone lesion. Small tricompartmental marginal osteophytes.  Other: None.  IMPRESSION: 1. Horizontal tear of the medial meniscus posterior horn. Free edge fraying of the midbody. 2. Mild tricompartmental osteoarthritis. 3. Small joint effusion.  Electronically Signed   By: Obie Dredge M.D.   On: 03/15/2023 15:44    Past Medical/Family/Surgical/Social History: Medications & Allergies reviewed per EMR, new medications updated. Patient Active Problem List   Diagnosis Date Noted   TB lung, latent 02/01/2023   Chronic bilateral low back pain without sciatica 08/02/2021   Migraines 10/02/2020   Other insomnia 02/27/2020   Snoring 02/27/2020   Sicca syndrome, unspecified 01/30/2020   Mood disorder (HCC) 01/30/2020   Panic disorder 12/13/2019   Seasonal allergies 11/30/2019   Borderline diabetic 09/05/2019   PTSD (post-traumatic stress disorder) 07/13/2019   Endometriosis determined by laparoscopy 02/11/2017   PID (acute pelvic inflammatory disease) 11/17/2016   Pelvic pain 11/15/2016   Family history of breast cancer in mother 10/21/2016   Pelvic pain in female 11/10/2015   Anxiety    Depression    Asthma    Class 3 obesity (HCC)    Hypertension    Thyroid disease    Past Medical History:  Diagnosis Date   Anxiety    no meds   Asthma    rarely uses inhaler - seasonal with allergies   Depression    no med   Hypertension    Hypothyroidism    Obesity    Seasonal allergies    SVD (spontaneous vaginal delivery) 1997   x    Thyroid disease    Family History  Problem Relation Age of Onset   Cancer Mother 22       Breast   Depression Mother    Hearing loss Mother    Hypertension Mother    Breast cancer Mother    Birth defects Sister    Asthma Brother     Past Surgical History:  Procedure Laterality Date   ABDOMINAL HYSTERECTOMY     APPENDECTOMY     CESAREAN SECTION  1993   x    DILATION AND CURETTAGE OF UTERUS  08/08/2008 & 03/08/2013   2009 missed ab, 2014 bleeding   LAPAROSCOPIC VAGINAL HYSTERECTOMY WITH SALPINGO OOPHORECTOMY Bilateral 12/06/2017   Procedure: LAPAROSCOPIC ASSISTED VAGINAL HYSTERECTOMY WITH SALPINGO OOPHORECTOMY;  Surgeon: Adam Phenix, MD;  Location: [facility];  Service: Gynecology;  Laterality: Bilateral;   LAPAROSCOPY N/A 01/24/2017   Procedure: LAPAROSCOPY DIAGNOSTIC;  Surgeon: Adam Phenix, MD;  Location: [facility];  Service: Gynecology;  Laterality: N/A;   SHOULDER SURGERY Left    THYROIDECTOMY     WISDOM TOOTH EXTRACTION     Social History   Occupational History   Not on file  Tobacco Use   Smoking status: Never   Smokeless tobacco: Never  Vaping Use   Vaping Use: Never used  Substance and Sexual Activity   Alcohol use: No   Drug use: No   Sexual activity: Not Currently    Birth  control/protection: None

## 2023-05-16 NOTE — Progress Notes (Signed)
Doing good; states she is doing HEP everyday No issues with those exercises Takes the meloxicam as prescribed

## 2023-05-20 ENCOUNTER — Other Ambulatory Visit: Payer: Self-pay | Admitting: Sports Medicine

## 2023-05-20 MED ORDER — MELOXICAM 15 MG PO TABS: 15.0000 mg | ORAL_TABLET | Freq: Every day | ORAL | 0 refills | Status: DC

## 2023-06-14 ENCOUNTER — Other Ambulatory Visit: Payer: Self-pay

## 2023-06-14 ENCOUNTER — Ambulatory Visit (INDEPENDENT_AMBULATORY_CARE_PROVIDER_SITE_OTHER): Payer: Managed Care, Other (non HMO) | Admitting: Family

## 2023-06-14 ENCOUNTER — Encounter: Payer: Self-pay | Admitting: Family

## 2023-06-14 VITALS — BP 114/80 | HR 56 | Temp 98.0°F | Resp 16 | Wt 246.6 lb

## 2023-06-14 DIAGNOSIS — Z227 Latent tuberculosis: Secondary | ICD-10-CM

## 2023-06-14 NOTE — Assessment & Plan Note (Signed)
Janice Roberts has completed 4 month course of Rifampin for latent tuberculosis with no adverse side effects. No additional treatment is needed at this time and it is safe for her to start on DMARD therapy as indicated by Rheumatology. Educated that [employer] and skin testing will remain positive and there is no definitive test of cure. Follow up with ID as needed.

## 2023-06-14 NOTE — Patient Instructions (Addendum)
Nice to see you.  No additional medication is necessary.  If screening needed for TB in the future - chest x-ray would be needed as other testing will remain positive.   Follow up with ID as needed.   Have a great day and stay safe!  Rheumatology:  Dr. Casimer Lanius  [address] [place], [place] [address]

## 2023-06-14 NOTE — Progress Notes (Signed)
Subjective:    Patient ID: Janice Roberts, female    DOB: 17-Mar-1976, 47 y.o.   MRN: [id number]  Chief Complaint  Patient presents with   Follow-up    TB lung, latent      HPI:  Janice Roberts is a 47 y.o. female with latent tuberculosis with positive Quantiferon Gold test last seen on 04/11/23 with good adherence and tolerance to rifampin returns today for routine follow up.  Janice Roberts has completed her 4 month course of Rifampin with no significant adverse side effects. Remains without symptoms and denies cough, fevers, night sweats or weight loss. Has plans to follow up with Rheumatology for additional treatment in the setting of continued joint pains.    Allergies  Allergen Reactions   Ivp Dye [Iodinated Contrast Media] Anaphylaxis and Other (See Comments)    "Almost died"   Latex Swelling and Other (See Comments)    irritation   Morphine And Codeine Hives   Oxycodone Hives   Penicillins Hives and Other (See Comments)    Has patient had a PCN reaction causing immediate rash, facial/tongue/throat swelling, SOB or lightheadedness with hypotension: No Has patient had a PCN reaction causing severe rash involving mucus membranes or skin necrosis: Yes Has patient had a PCN reaction that required hospitalization No Has patient had a PCN reaction occurring within the last 10 years: No If all of the above answers are "NO", then may proceed with Cephalosporin use.    Percocet [Oxycodone-Acetaminophen] Itching      Outpatient Medications Prior to Visit  Medication Sig Dispense Refill   albuterol (VENTOLIN HFA) 108 (90 Base) MCG/ACT inhaler Inhale 1-2 puffs into the lungs every 6 (six) hours as needed for wheezing or shortness of breath. 18 g 0   amLODipine (NORVASC) 5 MG tablet Take 1 tablet by mouth once daily 90 tablet 0   benazepril (LOTENSIN) 20 MG tablet Take 1 tablet by mouth once daily 90 tablet 0   cetirizine (ZYRTEC ALLERGY) 10 MG tablet Take 1 tablet (10 mg total) by  mouth daily. 30 tablet 0   clonazePAM (KLONOPIN) 1 MG tablet Take 1 mg by mouth 2 (two) times daily.     fluticasone (FLONASE) 50 MCG/ACT nasal spray Place 1 spray into both nostrils 2 (two) times daily as needed for allergies or rhinitis. 16 g 2   levothyroxine (SYNTHROID) 112 MCG tablet Take 1 tablet by mouth once daily 90 tablet 0   lidocaine (LIDODERM) 5 % Place 1 patch onto the skin daily. Remove & Discard patch within 12 hours or as directed by MD 20 patch 0   meloxicam (MOBIC) 15 MG tablet Take 1 tablet (15 mg total) by mouth daily. 30 tablet 0   metFORMIN (GLUCOPHAGE) 500 MG tablet Take 1 tablet (500 mg total) by mouth daily with breakfast. 90 tablet 1   Multiple Vitamins-Calcium (ONE-A-DAY WITHIN PO) Take 1 tablet by mouth daily.     Tetrahydrozoline HCl (VISINE OP) Place 1 Drop/kg into both eyes daily as needed (dry eyes).     cyclobenzaprine (FLEXERIL) 10 MG tablet Take 1 tablet (10 mg total) by mouth 2 (two) times daily as needed for muscle spasms. (Patient not taking: Reported on 04/11/2023) 20 tablet 0   predniSONE (DELTASONE) 20 MG tablet Take 2 tablets (40 mg total) by mouth daily. (Patient not taking: Reported on 04/11/2023) 12 tablet 0   No facility-administered medications prior to visit.     Past Medical History:  Diagnosis Date  Anxiety    no meds   Asthma    rarely uses inhaler - seasonal with allergies   Depression    no med   Hypertension    Hypothyroidism    Obesity    Seasonal allergies    SVD (spontaneous vaginal delivery) 1997   x    Thyroid disease      Past Surgical History:  Procedure Laterality Date   ABDOMINAL HYSTERECTOMY     APPENDECTOMY     CESAREAN SECTION  1993   x    DILATION AND CURETTAGE OF UTERUS  08/08/2008 & 03/08/2013   2009 missed ab, 2014 bleeding   LAPAROSCOPIC VAGINAL HYSTERECTOMY WITH SALPINGO OOPHORECTOMY Bilateral 12/06/2017   Procedure: LAPAROSCOPIC ASSISTED VAGINAL HYSTERECTOMY WITH SALPINGO OOPHORECTOMY;  Surgeon: Adam Phenix, MD;  Location: [facility];  Service: Gynecology;  Laterality: Bilateral;   LAPAROSCOPY N/A 01/24/2017   Procedure: LAPAROSCOPY DIAGNOSTIC;  Surgeon: Adam Phenix, MD;  Location: [facility];  Service: Gynecology;  Laterality: N/A;   SHOULDER SURGERY Left    THYROIDECTOMY     WISDOM TOOTH EXTRACTION         Review of Systems  Constitutional:  Negative for chills, diaphoresis, fatigue and fever.  Respiratory:  Negative for cough, chest tightness, shortness of breath and wheezing.   Cardiovascular:  Negative for chest pain.  Gastrointestinal:  Negative for abdominal pain, diarrhea, nausea and vomiting.      Objective:    BP 114/80   Pulse (!) 56   Temp 98 F (36.7 C) (Oral)   Resp 16   Wt 246 lb 9.6 oz (111.9 kg)   LMP 09/30/2017   SpO2 97%   BMI 45.10 kg/m  Nursing note and vital signs reviewed.  Physical Exam Constitutional:      General: She is not in acute distress.    Appearance: She is well-developed.  Cardiovascular:     Rate and Rhythm: Normal rate and regular rhythm.     Heart sounds: Normal heart sounds.  Pulmonary:     Effort: Pulmonary effort is normal.     Breath sounds: Normal breath sounds.  Skin:    General: Skin is warm and dry.  Neurological:     Mental Status: She is alert and oriented to person, place, and time.  Psychiatric:        Mood and Affect: Mood normal.         06/14/2023    9:35 AM 04/11/2023    8:45 AM 11/13/2021    9:18 AM 07/30/2021    9:57 AM 12/15/2020    7:56 AM  Depression screen PHQ 2/9  Decreased Interest 0 0 3 3 0  Down, Depressed, Hopeless 0 0 3 3 0  PHQ - 2 Score 0 0 6 6 0  Altered sleeping   2 3   Tired, decreased energy   3 3   Change in appetite   1 2   Feeling bad or failure about yourself    3 3   Trouble concentrating   2 2   Moving slowly or fidgety/restless   3 2   Suicidal thoughts   3 3   PHQ-9 Score   23 24   Difficult doing work/chores    Somewhat difficult        Assessment & Plan:    Patient  Active Problem List   Diagnosis Date Noted   TB lung, latent 02/01/2023   Chronic bilateral low back pain without sciatica 08/02/2021  Migraines 10/02/2020   Other insomnia 02/27/2020   Snoring 02/27/2020   Sicca syndrome, unspecified 01/30/2020   Mood disorder (HCC) 01/30/2020   Panic disorder 12/13/2019   Seasonal allergies 11/30/2019   Borderline diabetic 09/05/2019   PTSD (post-traumatic stress disorder) 07/13/2019   Endometriosis determined by laparoscopy 02/11/2017   PID (acute pelvic inflammatory disease) 11/17/2016   Pelvic pain 11/15/2016   Family history of breast cancer in mother 10/21/2016   Pelvic pain in female 11/10/2015   Anxiety    Depression    Asthma    Class 3 obesity (HCC)    Hypertension    Thyroid disease      Problem List Items Addressed This Visit       Other   TB lung, latent - Primary    Janice Roberts has completed 4 month course of Rifampin for latent tuberculosis with no adverse side effects. No additional treatment is needed at this time and it is safe for her to start on DMARD therapy as indicated by Rheumatology. Educated that [employer] and skin testing will remain positive and there is no definitive test of cure. Follow up with ID as needed.         I am having Janice Roberts maintain her Multiple Vitamins-Calcium (ONE-A-DAY WITHIN PO), Tetrahydrozoline HCl (VISINE OP), fluticasone, cetirizine, albuterol, metFORMIN, clonazePAM, benazepril, amLODipine, levothyroxine, cyclobenzaprine, lidocaine, predniSONE, and meloxicam.   Follow-up: As needed    Marcos Eke, MSN, FNP-C Nurse Practitioner [facility] Main number: [phone]

## 2023-06-17 ENCOUNTER — Other Ambulatory Visit: Payer: Self-pay | Admitting: Sports Medicine

## 2023-07-08 ENCOUNTER — Other Ambulatory Visit: Payer: Self-pay | Admitting: Sports Medicine

## 2023-12-19 ENCOUNTER — Ambulatory Visit (HOSPITAL_COMMUNITY)
Admission: RE | Admit: 2023-12-19 | Discharge: 2023-12-19 | Disposition: A | Discharge status: Home / Self Care | Payer: Managed Care, Other (non HMO) | Source: Ambulatory Visit | Attending: Emergency Medicine | Admitting: Emergency Medicine

## 2023-12-19 ENCOUNTER — Encounter (HOSPITAL_COMMUNITY): Payer: Self-pay

## 2023-12-19 VITALS — BP 148/102 | HR 63 | Temp 98.2°F | Resp 18

## 2023-12-19 DIAGNOSIS — N898 Other specified noninflammatory disorders of vagina: Secondary | ICD-10-CM | POA: Diagnosis present

## 2023-12-19 MED ORDER — METRONIDAZOLE 500 MG PO TABS: 500.0000 mg | ORAL_TABLET | Freq: Two times a day (BID) | ORAL | 0 refills | Status: DC

## 2023-12-19 NOTE — ED Triage Notes (Signed)
 Pt c/o vaginal irritation and itchy x1 wk. States hx of BV and yeast. States used OTC Monistat 7 with little releif.

## 2023-12-19 NOTE — ED Provider Notes (Signed)
 [facility]    CSN: [id number] Arrival date & time: 12/19/23  1531      History   Chief Complaint Chief Complaint  Patient presents with   Vaginal Itching    Entered by patient    HPI Janice Roberts is a 48 y.o. female.   Patient presents with vaginal discharge and irritation x 1 week. Denies abdominal pain, dysuria, hematuria, and vaginal bleeding. Reports hx of yeast infections and bacterial vaginosis. Patient states she has used Monistat over the last 7 days without relief.    Vaginal Itching Pertinent negatives include no abdominal pain.    Past Medical History:  Diagnosis Date   Anxiety    no meds   Asthma    rarely uses inhaler - seasonal with allergies   Depression    no med   Hypertension    Hypothyroidism    Obesity    Seasonal allergies    SVD (spontaneous vaginal delivery) 1997   x    Thyroid  disease     Patient Active Problem List   Diagnosis Date Noted   TB lung, latent 02/01/2023   Chronic bilateral low back pain without sciatica 08/02/2021   Migraines 10/02/2020   Other insomnia 02/27/2020   Snoring 02/27/2020   Sicca syndrome (HCC) 01/30/2020   Mood disorder (HCC) 01/30/2020   Panic disorder 12/13/2019   Seasonal allergies 11/30/2019   Borderline diabetic 09/05/2019   PTSD (post-traumatic stress disorder) 07/13/2019   Endometriosis determined by laparoscopy 02/11/2017   PID (acute pelvic inflammatory disease) 11/17/2016   Pelvic pain 11/15/2016   Family history of breast cancer in mother 10/21/2016   Pelvic pain in female 11/10/2015   Anxiety    Depression    Asthma    Class 3 obesity    Hypertension    Thyroid  disease     Past Surgical History:  Procedure Laterality Date   ABDOMINAL HYSTERECTOMY     APPENDECTOMY     CESAREAN SECTION  1993   x    DILATION AND CURETTAGE OF UTERUS  08/08/2008 & 03/08/2013   2009 missed ab, 2014 bleeding   LAPAROSCOPIC VAGINAL HYSTERECTOMY WITH SALPINGO OOPHORECTOMY Bilateral  12/06/2017   Procedure: LAPAROSCOPIC ASSISTED VAGINAL HYSTERECTOMY WITH SALPINGO OOPHORECTOMY;  Surgeon: Tresia Fruit, MD;  Location: [facility];  Service: Gynecology;  Laterality: Bilateral;   LAPAROSCOPY N/A 01/24/2017   Procedure: LAPAROSCOPY DIAGNOSTIC;  Surgeon: Tresia Fruit, MD;  Location: [facility];  Service: Gynecology;  Laterality: N/A;   SHOULDER SURGERY Left    THYROIDECTOMY     WISDOM TOOTH EXTRACTION      OB History     Gravida  10   Para  2   Term  2   Preterm  0   AB  8   Living  2      SAB  1   IAB  7   Ectopic      Multiple      Live Births  2            Home Medications    Prior to Admission medications   Medication Sig Start Date End Date Taking? Authorizing Provider  metroNIDAZOLE  (FLAGYL ) 500 MG tablet Take 1 tablet (500 mg total) by mouth 2 (two) times daily. 12/19/23  Yes Levora Reas A, NP  albuterol  (VENTOLIN  HFA) 108 (90 Base) MCG/ACT inhaler Inhale 1-2 puffs into the lungs every 6 (six) hours as needed for wheezing or shortness of breath. 05/31/21   Pinkey Brier,  Misty Amour, PA-C  amLODipine  (NORVASC ) 5 MG tablet Take 1 tablet by mouth once daily 01/26/22   Austine Lefort, MD  benazepril  (LOTENSIN ) 20 MG tablet Take 1 tablet by mouth once daily 01/26/22   Austine Lefort, MD  cetirizine  (ZYRTEC  ALLERGY) 10 MG tablet Take 1 tablet (10 mg total) by mouth daily. 05/31/21   Adolph Hoop, PA-C  clonazePAM  (KLONOPIN ) 1 MG tablet Take 1 mg by mouth 2 (two) times daily.    [provider]  cyclobenzaprine  (FLEXERIL ) 10 MG tablet Take 1 tablet (10 mg total) by mouth 2 (two) times daily as needed for muscle spasms. Patient not taking: Reported on 04/11/2023 04/13/22   Scarlette Currier, MD  fluticasone  (FLONASE ) 50 MCG/ACT nasal spray Place 1 spray into both nostrils 2 (two) times daily as needed for allergies or rhinitis. 12/15/20   Wilhemena Harbour, NP  levothyroxine  (SYNTHROID ) 112 MCG tablet Take 1 tablet by mouth once daily 01/26/22   Austine Lefort, MD  lidocaine  (LIDODERM ) 5 % Place 1 patch onto the skin daily. Remove & Discard patch within 12 hours or as directed by MD 04/15/22   Aloysius Janus, PA-C  meloxicam  (MOBIC ) 15 MG tablet Take 1 tablet (15 mg total) by mouth daily. 05/20/23   Brooks, Dana, DO  metFORMIN  (GLUCOPHAGE ) 500 MG tablet Take 1 tablet (500 mg total) by mouth daily with breakfast. 07/30/21   Wilhemena Harbour, NP  Multiple Vitamins-Calcium (ONE-A-DAY WITHIN PO) Take 1 tablet by mouth daily.    [provider]  Tetrahydrozoline HCl (VISINE OP) Place 1 Drop/kg into both eyes daily as needed (dry eyes).    [provider]  SUMAtriptan  (IMITREX ) 100 MG tablet Take 1/2 to 1 full tablet at onset of headache Patient taking differently: Take 100 mg by mouth every 2 (two) hours as needed for migraine. 10/01/20 12/10/20  Mathis Som, MD    Family History Family History  Problem Relation Age of Onset   Cancer Mother 20       Breast   Depression Mother    Hearing loss Mother    Hypertension Mother    Breast cancer Mother    Birth defects Sister    Asthma Brother     Social History Social History   Tobacco Use   Smoking status: Never   Smokeless tobacco: Never  Vaping Use   Vaping status: Never Used  Substance Use Topics   Alcohol use: No   Drug use: No     Allergies   Ivp dye [iodinated contrast media], Latex, Morphine and codeine, Oxycodone , Penicillins, and Percocet [oxycodone -acetaminophen ]   Review of Systems Review of Systems  Constitutional:  Negative for fever.  Gastrointestinal:  Negative for abdominal pain.  Genitourinary:  Positive for vaginal discharge. Negative for dysuria, flank pain, hematuria and vaginal bleeding.     Physical Exam Triage Vital Signs ED Triage Vitals [12/19/23 1618]  Encounter Vitals Group     BP (!) 148/102     Systolic BP Percentile      Diastolic BP Percentile      Pulse Rate 63     Resp 18     Temp 98.2 F (36.8 C)     Temp Source Oral      SpO2 98 %     Weight      Height      Head Circumference      Peak Flow      Pain Score 0  Pain Loc      Pain Education      Exclude from Growth Chart    No data found.  Updated Vital Signs BP (!) 148/102 (BP Location: Right Arm)   Pulse 63   Temp 98.2 F (36.8 C) (Oral)   Resp 18   LMP 09/30/2017   SpO2 98%   Visual Acuity Right Eye Distance:   Left Eye Distance:   Bilateral Distance:    Right Eye Near:   Left Eye Near:    Bilateral Near:     Physical Exam Vitals and nursing note reviewed.  Constitutional:      General: She is awake. She is not in acute distress.    Appearance: Normal appearance. She is well-developed and well-groomed. She is not ill-appearing.  Genitourinary:    Comments: Exam deferred.  Neurological:     Mental Status: She is alert.  Psychiatric:        Behavior: Behavior is cooperative.      UC Treatments / Results  Labs (all labs ordered are listed, but only abnormal results are displayed) Labs Reviewed  CERVICOVAGINAL ANCILLARY ONLY    EKG   Radiology No results found.  Procedures Procedures (including critical care time)  Medications Ordered in UC Medications - No data to display  Initial Impression / Assessment and Plan / UC Course  I have reviewed the triage vital signs and the nursing notes.  Pertinent labs & imaging results that were available during my care of the patient were reviewed by me and considered in my medical decision making (see chart for details).     Patient presents with persistent vaginal discharge and irritation after using Monistat for 7 days without relief. Hx of recurrent yeast infections and BV.   No significant findings on assessment. GU exam deferred. Patient performed self swab for STI.  Discussed follow-up and return precautions.  Final Clinical Impressions(s) / UC Diagnoses   Final diagnoses:  Vaginal discharge     Discharge Instructions      Start taking Flagyl  twice  daily for 7 days. Your results will come back over the next few days and someone will call and adjust treatment as needed. Return here as needed.     ED Prescriptions     Medication Sig Dispense Auth. Provider   metroNIDAZOLE  (FLAGYL ) 500 MG tablet Take 1 tablet (500 mg total) by mouth 2 (two) times daily. 14 tablet Levora Reas A, NP      PDMP not reviewed this encounter.   Levora Reas A, NP 12/19/23 [phone]

## 2023-12-19 NOTE — Discharge Instructions (Signed)
 Start taking Flagyl  twice daily for 7 days. Your results will come back over the next few days and someone will call and adjust treatment as needed. Return here as needed.

## 2023-12-20 LAB — CERVICOVAGINAL ANCILLARY ONLY
Bacterial Vaginitis (gardnerella): POSITIVE — AB
Candida Glabrata: NEGATIVE
Candida Vaginitis: NEGATIVE
Comment: NEGATIVE
Comment: NEGATIVE
Comment: NEGATIVE

## 2024-01-19 ENCOUNTER — Ambulatory Visit (HOSPITAL_COMMUNITY)
Admission: RE | Admit: 2024-01-19 | Discharge: 2024-01-19 | Disposition: A | Discharge status: Home / Self Care | Source: Ambulatory Visit | Attending: Emergency Medicine | Admitting: Emergency Medicine

## 2024-01-19 ENCOUNTER — Encounter (HOSPITAL_COMMUNITY): Payer: Self-pay

## 2024-01-19 VITALS — BP 118/81 | HR 76 | Temp 98.7°F | Resp 16

## 2024-01-19 DIAGNOSIS — R350 Frequency of micturition: Secondary | ICD-10-CM | POA: Diagnosis not present

## 2024-01-19 DIAGNOSIS — R300 Dysuria: Secondary | ICD-10-CM | POA: Diagnosis not present

## 2024-01-19 DIAGNOSIS — N10 Acute tubulo-interstitial nephritis: Secondary | ICD-10-CM

## 2024-01-19 DIAGNOSIS — N12 Tubulo-interstitial nephritis, not specified as acute or chronic: Secondary | ICD-10-CM | POA: Diagnosis not present

## 2024-01-19 LAB — POCT URINALYSIS DIP (MANUAL ENTRY)
Bilirubin, UA: NEGATIVE
Glucose, UA: NEGATIVE mg/dL
Ketones, POC UA: NEGATIVE mg/dL
Nitrite, UA: POSITIVE — AB
Protein Ur, POC: 100 mg/dL — AB
Spec Grav, UA: >=1.03 — AB (ref 1.010–1.025)
Urobilinogen, UA: 1 U/dL
pH, UA: 5.5 (ref 5.0–8.0)

## 2024-01-19 MED ORDER — ONDANSETRON HCL 4 MG PO TABS: 4.0000 mg | ORAL_TABLET | Freq: Three times a day (TID) | ORAL | 0 refills | Status: DC | PRN

## 2024-01-19 MED ORDER — CIPROFLOXACIN HCL 500 MG PO TABS: 500.0000 mg | ORAL_TABLET | Freq: Two times a day (BID) | ORAL | 0 refills | Status: AC

## 2024-01-19 NOTE — ED Provider Notes (Signed)
 [facility]    CSN: [id number] Arrival date & time: 01/19/24  1848      History   Chief Complaint Chief Complaint  Patient presents with   Urinary Frequency   Appointment    HPI ZYNASIA BURKLOW is a 48 y.o. female. Here for urinary sx. Reports dysuria and urgency since 01/16/24. Low grade fever that day. Next day was peeing blood. Next day started to have RLQ pain and R CVAT. No UTI in years. Deneis vomting, does have a little nausea. No vag d/c. Didn't finish all of med for bacterial vaginosis from feb b/c she got better and felt she no longer needed the med. She restarted BV med starting 01/16/24 and didn't feel better so came today for eval.    Urinary Frequency    Past Medical History:  Diagnosis Date   Anxiety    no meds   Asthma    rarely uses inhaler - seasonal with allergies   Depression    no med   Hypertension    Hypothyroidism    Obesity    Seasonal allergies    SVD (spontaneous vaginal delivery) 1997   x    Thyroid disease     Patient Active Problem List   Diagnosis Date Noted   TB lung, latent 02/01/2023   Chronic bilateral low back pain without sciatica 08/02/2021   Migraines 10/02/2020   Other insomnia 02/27/2020   Snoring 02/27/2020   Sicca syndrome (HCC) 01/30/2020   Mood disorder (HCC) 01/30/2020   Panic disorder 12/13/2019   Seasonal allergies 11/30/2019   Borderline diabetic 09/05/2019   PTSD (post-traumatic stress disorder) 07/13/2019   Endometriosis determined by laparoscopy 02/11/2017   PID (acute pelvic inflammatory disease) 11/17/2016   Pelvic pain 11/15/2016   Family history of breast cancer in mother 10/21/2016   Pelvic pain in female 11/10/2015   Anxiety    Depression    Asthma    Class 3 obesity    Hypertension    Thyroid disease     Past Surgical History:  Procedure Laterality Date   ABDOMINAL HYSTERECTOMY     APPENDECTOMY     CESAREAN SECTION  1993   x    DILATION AND CURETTAGE OF UTERUS  08/08/2008 &  03/08/2013   2009 missed ab, 2014 bleeding   LAPAROSCOPIC VAGINAL HYSTERECTOMY WITH SALPINGO OOPHORECTOMY Bilateral 12/06/2017   Procedure: LAPAROSCOPIC ASSISTED VAGINAL HYSTERECTOMY WITH SALPINGO OOPHORECTOMY;  Surgeon: Adam Phenix, MD;  Location: [facility];  Service: Gynecology;  Laterality: Bilateral;   LAPAROSCOPY N/A 01/24/2017   Procedure: LAPAROSCOPY DIAGNOSTIC;  Surgeon: Adam Phenix, MD;  Location: [facility];  Service: Gynecology;  Laterality: N/A;   SHOULDER SURGERY Left    THYROIDECTOMY     WISDOM TOOTH EXTRACTION      OB History     Gravida  10   Para  2   Term  2   Preterm  0   AB  8   Living  2      SAB  1   IAB  7   Ectopic      Multiple      Live Births  2            Home Medications    Prior to Admission medications   Medication Sig Start Date End Date Taking? Authorizing Provider  albuterol (VENTOLIN HFA) 108 (90 Base) MCG/ACT inhaler Inhale 1-2 puffs into the lungs every 6 (six) hours as needed for wheezing or shortness  of breath. 05/31/21  Yes Wallis Bamberg, PA-C  amLODipine (NORVASC) 5 MG tablet Take 1 tablet by mouth once daily 01/26/22  Yes Pickard, Priscille Heidelberg, MD  benazepril (LOTENSIN) 20 MG tablet Take 1 tablet by mouth once daily 01/26/22  Yes Pickard, Priscille Heidelberg, MD  clonazePAM (KLONOPIN) 1 MG tablet Take 1 mg by mouth 2 (two) times daily.   Yes [provider]  cyclobenzaprine (FLEXERIL) 10 MG tablet Take 1 tablet (10 mg total) by mouth 2 (two) times daily as needed for muscle spasms. 04/13/22  Yes Alvira Monday, MD  fluticasone (FLONASE) 50 MCG/ACT nasal spray Place 1 spray into both nostrils 2 (two) times daily as needed for allergies or rhinitis. 12/15/20  Yes Valentino Nose, NP  levothyroxine (SYNTHROID) 100 MCG tablet Take 100 mcg by mouth daily before breakfast.   Yes [provider]  lidocaine (LIDODERM) 5 % Place 1 patch onto the skin daily. Remove & Discard patch within 12 hours or as directed by MD 04/15/22  Yes  Rhys Martini, PA-C  meloxicam (MOBIC) 15 MG tablet Take 1 tablet (15 mg total) by mouth daily. 05/20/23  Yes Madelyn Brunner, DO  metroNIDAZOLE (FLAGYL) 500 MG tablet Take 1 tablet (500 mg total) by mouth 2 (two) times daily. 12/19/23  Yes Susann Givens, Carley A, NP  Multiple Vitamins-Calcium (ONE-A-DAY WITHIN PO) Take 1 tablet by mouth daily.   Yes [provider]  ondansetron (ZOFRAN) 4 MG tablet Take 1 tablet (4 mg total) by mouth every 8 (eight) hours as needed for nausea or vomiting. 01/19/24  Yes Cathlyn Parsons, NP  SUMAtriptan (IMITREX) 100 MG tablet Take 1/2 to 1 full tablet at onset of headache Patient taking differently: Take 100 mg by mouth every 2 (two) hours as needed for migraine. 10/01/20 12/10/20  Salley Scarlet, MD    Family History Family History  Problem Relation Age of Onset   Cancer Mother 70       Breast   Depression Mother    Hearing loss Mother    Hypertension Mother    Breast cancer Mother    Birth defects Sister    Asthma Brother     Social History Social History   Tobacco Use   Smoking status: Never   Smokeless tobacco: Never  Vaping Use   Vaping status: Never Used  Substance Use Topics   Alcohol use: No   Drug use: No     Allergies   Ivp dye [iodinated contrast media], Latex, Morphine and codeine, Oxycodone, Penicillins, and Percocet [oxycodone-acetaminophen]   Review of Systems Review of Systems  Genitourinary:  Positive for frequency.     Physical Exam Triage Vital Signs ED Triage Vitals  Encounter Vitals Group     BP 01/19/24 1919 118/81     Systolic BP Percentile --      Diastolic BP Percentile --      Pulse Rate 01/19/24 1919 76     Resp 01/19/24 1919 16     Temp 01/19/24 1919 98.7 F (37.1 C)     Temp Source 01/19/24 1919 Oral     SpO2 01/19/24 1919 98 %     Weight --      Height --      Head Circumference --      Peak Flow --      Pain Score 01/19/24 1916 5     Pain Loc --      Pain Education --      Exclude from  Growth Chart --    No data found.  Updated Vital Signs BP 118/81 (BP Location: Right Arm)   Pulse 76   Temp 98.7 F (37.1 C) (Oral)   Resp 16   LMP 09/30/2017   SpO2 98%   Visual Acuity Right Eye Distance:   Left Eye Distance:   Bilateral Distance:    Right Eye Near:   Left Eye Near:    Bilateral Near:     Physical Exam Constitutional:      Appearance: Normal appearance. She is obese.  Cardiovascular:     Rate and Rhythm: Normal rate and regular rhythm.  Pulmonary:     Effort: Pulmonary effort is normal.     Breath sounds: Normal breath sounds.  Abdominal:     General: Abdomen is flat. Bowel sounds are normal.     Palpations: Abdomen is soft.     Tenderness: There is abdominal tenderness in the right lower quadrant. There is right CVA tenderness. There is no left CVA tenderness, guarding or rebound.      UC Treatments / Results  Labs (all labs ordered are listed, but only abnormal results are displayed) Labs Reviewed  URINE CULTURE - Abnormal; Notable for the following components:      Result Value   Culture >=100,000 COLONIES/mL ESCHERICHIA COLI (*)    Organism ID, Bacteria ESCHERICHIA COLI (*)    All other components within normal limits  POCT URINALYSIS DIP (MANUAL ENTRY) - Abnormal; Notable for the following components:   Color, UA straw (*)    Clarity, UA cloudy (*)    Spec Grav, UA >=1.030 (*)    Blood, UA large (*)    Protein Ur, POC =100 (*)    Nitrite, UA Positive (*)    Leukocytes, UA Small (1+) (*)    All other components within normal limits    EKG   Radiology No results found.  Procedures Procedures (including critical care time)  Medications Ordered in UC Medications - No data to display  Initial Impression / Assessment and Plan / UC Course  I have reviewed the triage vital signs and the nursing notes.  Pertinent labs & imaging results that were available during my care of the patient were reviewed by me and considered in my  medical decision making (see chart for details).    I am concerned for pyelo. Stressed to come back if sx worsened instead of improved and not to delay f/u if not improving.   Final Clinical Impressions(s) / UC Diagnoses   Final diagnoses:  Pyelonephritis     Discharge Instructions      Drink lots of fluids. Come back to get rechecked if you feel like you are getting worse and not better.   Use the nausea medicine if you need it. Use tylenol or ibuprofen for pain or fever as directed on the package.      ED Prescriptions     Medication Sig Dispense Auth. Provider   ciprofloxacin (CIPRO) 500 MG tablet Take 1 tablet (500 mg total) by mouth 2 (two) times daily for 7 days. 14 tablet Cathlyn Parsons, NP   ondansetron (ZOFRAN) 4 MG tablet Take 1 tablet (4 mg total) by mouth every 8 (eight) hours as needed for nausea or vomiting. 20 tablet Cathlyn Parsons, NP      PDMP not reviewed this encounter.   Cathlyn Parsons, NP 02/02/24 1004

## 2024-01-19 NOTE — Discharge Instructions (Signed)
 Drink lots of fluids. Come back to get rechecked if you feel like you are getting worse and not better.   Use the nausea medicine if you need it. Use tylenol or ibuprofen for pain or fever as directed on the package.

## 2024-01-19 NOTE — ED Triage Notes (Signed)
"  I have UTI It just started and I might have a bacteria infection pee is cloudy no itching just irritated." - Entered by patient  Onset of symptoms this past Monday.

## 2024-01-21 LAB — URINE CULTURE: Culture: >=100000 — AB

## 2024-02-13 DIAGNOSIS — E039 Hypothyroidism, unspecified: Secondary | ICD-10-CM | POA: Diagnosis not present

## 2024-02-22 DIAGNOSIS — R0602 Shortness of breath: Secondary | ICD-10-CM | POA: Diagnosis not present

## 2024-02-22 DIAGNOSIS — I10 Essential (primary) hypertension: Secondary | ICD-10-CM | POA: Diagnosis not present

## 2024-02-22 DIAGNOSIS — R062 Wheezing: Secondary | ICD-10-CM | POA: Diagnosis not present

## 2024-02-22 DIAGNOSIS — E039 Hypothyroidism, unspecified: Secondary | ICD-10-CM | POA: Diagnosis not present

## 2024-03-08 DIAGNOSIS — J45909 Unspecified asthma, uncomplicated: Secondary | ICD-10-CM | POA: Diagnosis not present

## 2024-03-09 ENCOUNTER — Ambulatory Visit: Payer: Self-pay

## 2024-03-28 DIAGNOSIS — E039 Hypothyroidism, unspecified: Secondary | ICD-10-CM | POA: Diagnosis not present

## 2024-04-20 DIAGNOSIS — J4520 Mild intermittent asthma, uncomplicated: Secondary | ICD-10-CM | POA: Diagnosis not present

## 2024-04-20 DIAGNOSIS — E7800 Pure hypercholesterolemia, unspecified: Secondary | ICD-10-CM | POA: Diagnosis not present

## 2024-04-20 DIAGNOSIS — G4733 Obstructive sleep apnea (adult) (pediatric): Secondary | ICD-10-CM | POA: Diagnosis not present

## 2024-04-20 DIAGNOSIS — I10 Essential (primary) hypertension: Secondary | ICD-10-CM | POA: Diagnosis not present

## 2024-07-18 DIAGNOSIS — N898 Other specified noninflammatory disorders of vagina: Secondary | ICD-10-CM | POA: Diagnosis not present

## 2024-07-18 DIAGNOSIS — G43009 Migraine without aura, not intractable, without status migrainosus: Secondary | ICD-10-CM | POA: Diagnosis not present

## 2024-07-18 DIAGNOSIS — Z0000 Encounter for general adult medical examination without abnormal findings: Secondary | ICD-10-CM | POA: Diagnosis not present

## 2024-07-18 DIAGNOSIS — R7303 Prediabetes: Secondary | ICD-10-CM | POA: Diagnosis not present

## 2024-07-18 DIAGNOSIS — M5441 Lumbago with sciatica, right side: Secondary | ICD-10-CM | POA: Diagnosis not present

## 2024-07-18 DIAGNOSIS — I10 Essential (primary) hypertension: Secondary | ICD-10-CM | POA: Diagnosis not present

## 2024-07-18 DIAGNOSIS — J4520 Mild intermittent asthma, uncomplicated: Secondary | ICD-10-CM | POA: Diagnosis not present

## 2024-07-18 DIAGNOSIS — E7800 Pure hypercholesterolemia, unspecified: Secondary | ICD-10-CM | POA: Diagnosis not present

## 2024-07-18 DIAGNOSIS — E039 Hypothyroidism, unspecified: Secondary | ICD-10-CM | POA: Diagnosis not present

## 2024-07-20 ENCOUNTER — Other Ambulatory Visit: Payer: Self-pay | Admitting: Internal Medicine

## 2024-07-20 DIAGNOSIS — N644 Mastodynia: Secondary | ICD-10-CM

## 2024-08-03 ENCOUNTER — Ambulatory Visit
Admission: RE | Admit: 2024-08-03 | Discharge: 2024-08-03 | Disposition: A | Discharge status: Home / Self Care | Source: Ambulatory Visit | Attending: Internal Medicine | Admitting: Internal Medicine

## 2024-08-03 ENCOUNTER — Ambulatory Visit

## 2024-08-03 DIAGNOSIS — N644 Mastodynia: Secondary | ICD-10-CM

## 2024-08-16 ENCOUNTER — Other Ambulatory Visit: Payer: Self-pay | Admitting: Sports Medicine

## 2024-09-10 ENCOUNTER — Encounter: Payer: Self-pay | Admitting: Radiology

## 2024-09-15 ENCOUNTER — Other Ambulatory Visit: Payer: Self-pay | Admitting: Sports Medicine

## 2024-10-14 ENCOUNTER — Other Ambulatory Visit: Payer: Self-pay | Admitting: Sports Medicine

## 2024-11-03 ENCOUNTER — Encounter (HOSPITAL_COMMUNITY): Payer: Self-pay

## 2024-11-03 ENCOUNTER — Ambulatory Visit (HOSPITAL_COMMUNITY)
Admission: RE | Admit: 2024-11-03 | Discharge: 2024-11-03 | Disposition: A | Discharge status: Home / Self Care | Payer: Self-pay | Source: Ambulatory Visit | Attending: Family Medicine | Admitting: Family Medicine

## 2024-11-03 VITALS — BP 114/84 | HR 77 | Temp 99.0°F | Resp 18 | Ht 62.0 in | Wt 250.0 lb

## 2024-11-03 DIAGNOSIS — J4521 Mild intermittent asthma with (acute) exacerbation: Secondary | ICD-10-CM

## 2024-11-03 DIAGNOSIS — J019 Acute sinusitis, unspecified: Secondary | ICD-10-CM | POA: Diagnosis not present

## 2024-11-03 MED ORDER — PREDNISONE 20 MG PO TABS: 40.0000 mg | ORAL_TABLET | Freq: Every day | ORAL | 0 refills | Status: AC

## 2024-11-03 MED ORDER — BENZONATATE 100 MG PO CAPS: 100.0000 mg | ORAL_CAPSULE | Freq: Three times a day (TID) | ORAL | 0 refills | Status: AC | PRN

## 2024-11-03 MED ORDER — DOXYCYCLINE HYCLATE 100 MG PO CAPS: 100.0000 mg | ORAL_CAPSULE | Freq: Two times a day (BID) | ORAL | 0 refills | Status: AC

## 2024-11-03 NOTE — ED Triage Notes (Addendum)
 Onset last Friday 12/19, with cough, sore throat, voice loss, and fever. States her grandson was sick with the flu.   Patient tried tylenol  arthritis, Ibuprofen , and Benadryl with mild relief.

## 2024-11-03 NOTE — Discharge Instructions (Signed)
 Take doxycycline  100 mg --1 capsule 2 times daily for 7 days; this is the antibiotic  Take prednisone  20 mg--2 daily for 5 days  Take benzonatate  100 mg, 1 tab every 8 hours as needed for cough.  Drink plenty of oral fluids

## 2024-11-03 NOTE — ED Provider Notes (Signed)
 " [facility]    CSN: [id number] Arrival date & time: 11/03/24  1325      History   Chief Complaint Chief Complaint  Patient presents with   Cough   Appointment    HPI Janice Roberts is a 48 y.o. female.    Cough  Here for cough and sore throat and hoarseness and subjective fever.  Symptoms began on December 19.  She had the subjective fever on about the 22nd through the 25th, but that has resolved.  No nausea or vomiting or diarrhea  She has had nasal congestion and postnasal drainage.  She is also having some headache now.  Her asthma has been worse since this began and she has been using her inhaler as needed.  It has been giving her some relief.   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   Chronic bilateral low back pain without sciatica 08/02/2021   Migraines 10/02/2020   Other insomnia 02/27/2020   Snoring 02/27/2020   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   Mood disorder 01/30/2020   Panic disorder 12/13/2019   Seasonal allergies 11/30/2019   Borderline diabetic 09/05/2019   PTSD (post-traumatic stress disorder) 07/13/2019   Endometriosis determined by laparoscopy 02/11/2017   PID (acute pelvic inflammatory disease) 11/17/2016   Pelvic pain 11/15/2016   Family history of breast cancer in mother 10/21/2016   Pelvic pain in female 11/10/2015   Anxiety    Depression    Asthma    Class 3 obesity (HCC)    Hypertension    Thyroid  disease     Past Surgical History:  Procedure Laterality Date   ABDOMINAL HYSTERECTOMY      APPENDECTOMY     CESAREAN SECTION  1993   x    DILATION AND CURETTAGE OF UTERUS  08/08/2008 & 03/08/2013   2009 missed ab, 2014 bleeding   LAPAROSCOPIC VAGINAL HYSTERECTOMY WITH SALPINGO OOPHORECTOMY Bilateral 12/06/2017   Procedure: LAPAROSCOPIC ASSISTED VAGINAL HYSTERECTOMY WITH SALPINGO OOPHORECTOMY;  Surgeon: Eveline Lynwood MATSU, MD;  Location: [facility];  Service: Gynecology;  Laterality: Bilateral;   LAPAROSCOPY N/A 01/24/2017   Procedure: LAPAROSCOPY DIAGNOSTIC;  Surgeon: Lynwood MATSU Eveline, MD;  Location: [facility];  Service: Gynecology;  Laterality: N/A;   SHOULDER SURGERY Left    THYROIDECTOMY     WISDOM TOOTH EXTRACTION      OB History     Gravida  10   Para  2   Term  2   Preterm  0   AB  8   Living  2      SAB  1   IAB  7   Ectopic      Multiple      Live Births  2            Home Medications    Prior to Admission medications  Medication Sig Start Date End Date Taking? Authorizing Provider  albuterol  (VENTOLIN  HFA) 108 (  90 Base) MCG/ACT inhaler Inhale 1-2 puffs into the lungs every 6 (six) hours as needed for wheezing or shortness of breath. 05/31/21  Yes Christopher Savannah, PA-C  amLODipine  (NORVASC ) 5 MG tablet Take 1 tablet by mouth once daily 01/26/22  Yes Duanne Butler DASEN, MD  benazepril  (LOTENSIN ) 20 MG tablet Take 1 tablet by mouth once daily 01/26/22  Yes Duanne Butler DASEN, MD  benzonatate  (TESSALON ) 100 MG capsule Take 1 capsule (100 mg total) by mouth 3 (three) times daily as needed for cough. 11/03/24  Yes Vonna Sharlet POUR, MD  doxycycline  (VIBRAMYCIN ) 100 MG capsule Take 1 capsule (100 mg total) by mouth 2 (two) times daily for 7 days. 11/03/24 11/10/24 Yes Vonna Sharlet POUR, MD  levothyroxine  (SYNTHROID ) 100 MCG tablet Take 100 mcg by mouth daily before breakfast.   Yes [provider]  predniSONE  (DELTASONE ) 20 MG tablet Take 2 tablets (40 mg total) by mouth daily with breakfast for 5 days. 11/03/24 11/08/24 Yes Vonna Sharlet POUR, MD  SUMAtriptan   (IMITREX ) 100 MG tablet Take 1/2 to 1 full tablet at onset of headache Patient taking differently: Take 100 mg by mouth every 2 (two) hours as needed for migraine. 10/01/20 12/10/20  Bari Theodoro FALCON, MD    Family History Family History  Problem Relation Age of Onset   Cancer Mother 95       Breast   Depression Mother    Hearing loss Mother    Hypertension Mother    Breast cancer Mother    Birth defects Sister    Asthma Brother     Social History Social History[1]   Allergies   Ivp dye [iodinated contrast media], Latex, Morphine and codeine, Oxycodone , Penicillins, and Percocet [oxycodone -acetaminophen ]   Review of Systems Review of Systems  Respiratory:  Positive for cough.      Physical Exam Triage Vital Signs ED Triage Vitals  Encounter Vitals Group     BP 11/03/24 1335 114/84     Girls Systolic BP Percentile --      Girls Diastolic BP Percentile --      Boys Systolic BP Percentile --      Boys Diastolic BP Percentile --      Pulse Rate 11/03/24 1335 77     Resp 11/03/24 1335 18     Temp 11/03/24 1335 99 F (37.2 C)     Temp Source 11/03/24 1335 Oral     SpO2 11/03/24 1335 97 %     Weight 11/03/24 1335 250 lb (113.4 kg)     Height 11/03/24 1335 5' 2 (1.575 m)     Head Circumference --      Peak Flow --      Pain Score 11/03/24 1334 0     Pain Loc --      Pain Education --      Exclude from Growth Chart --    No data found.  Updated Vital Signs BP 114/84 (BP Location: Right Arm)   Pulse 77   Temp 99 F (37.2 C) (Oral)   Resp 18   Ht 5' 2 (1.575 m)   Wt 113.4 kg   LMP 09/30/2017   SpO2 97%   BMI 45.73 kg/m   Visual Acuity Right Eye Distance:   Left Eye Distance:   Bilateral Distance:    Right Eye Near:   Left Eye Near:    Bilateral Near:     Physical Exam Vitals reviewed.  Constitutional:      General: She is  not in acute distress.    Appearance: She is not ill-appearing, toxic-appearing or diaphoretic.  HENT:     Right Ear:  Tympanic membrane and ear canal normal.     Left Ear: Tympanic membrane and ear canal normal.     Nose: Congestion present.     Mouth/Throat:     Mouth: Mucous membranes are moist.     Comments: There is copious yellow and white drainage in the posterior oropharynx.  No erythema or tonsillar hypertrophy Eyes:     Extraocular Movements: Extraocular movements intact.     Conjunctiva/sclera: Conjunctivae normal.     Pupils: Pupils are equal, round, and reactive to light.  Cardiovascular:     Rate and Rhythm: Normal rate and regular rhythm.     Heart sounds: No murmur heard. Pulmonary:     Effort: Pulmonary effort is normal. No respiratory distress.     Breath sounds: No stridor. No wheezing, rhonchi or rales.  Musculoskeletal:     Cervical back: Neck supple.  Lymphadenopathy:     Cervical: No cervical adenopathy.  Skin:    Capillary Refill: Capillary refill takes less than 2 seconds.     Coloration: Skin is not jaundiced or pale.  Neurological:     General: No focal deficit present.     Mental Status: She is alert and oriented to person, place, and time.  Psychiatric:        Behavior: Behavior normal.      UC Treatments / Results  Labs (all labs ordered are listed, but only abnormal results are displayed) Labs Reviewed - No data to display  EKG   Radiology No results found.  Procedures Procedures (including critical care time)  Medications Ordered in UC Medications - No data to display  Initial Impression / Assessment and Plan / UC Course  I have reviewed the triage vital signs and the nursing notes.  Pertinent labs & imaging results that were available during my care of the patient were reviewed by me and considered in my medical decision making (see chart for details).     Vital signs are reassuring.  Treatment is sent in with prednisone  for the asthma exacerbation and doxycycline  sent in to treat the acute sinusitis, since she has had symptoms for over 1 week.   Tessalon  Perles are sent in for the cough Final Clinical Impressions(s) / UC Diagnoses   Final diagnoses:  Acute sinusitis, recurrence not specified, unspecified location  Mild intermittent asthma with exacerbation     Discharge Instructions      Take doxycycline  100 mg --1 capsule 2 times daily for 7 days; this is the antibiotic  Take prednisone  20 mg--2 daily for 5 days  Take benzonatate  100 mg, 1 tab every 8 hours as needed for cough.  Drink plenty of oral fluids     ED Prescriptions     Medication Sig Dispense Auth. Provider   doxycycline  (VIBRAMYCIN ) 100 MG capsule Take 1 capsule (100 mg total) by mouth 2 (two) times daily for 7 days. 14 capsule Eloise Mula K, MD   predniSONE  (DELTASONE ) 20 MG tablet Take 2 tablets (40 mg total) by mouth daily with breakfast for 5 days. 10 tablet Vonna Sharlet POUR, MD   benzonatate  (TESSALON ) 100 MG capsule Take 1 capsule (100 mg total) by mouth 3 (three) times daily as needed for cough. 21 capsule Genesys Coggeshall K, MD      PDMP not reviewed this encounter.    [1]  Social History Tobacco Use  Smoking status: Never   Smokeless tobacco: Never  Vaping Use   Vaping status: Never Used  Substance Use Topics   Alcohol use: No   Drug use: No     Vonna Sharlet POUR, MD 11/03/24 1356  "

## 2024-11-13 ENCOUNTER — Other Ambulatory Visit: Payer: Self-pay | Admitting: Sports Medicine
